# Patient Record
Sex: Male | Born: 1940 | Race: White | Hispanic: No | Marital: Married | Smoking: Never smoker
Health system: Southern US, Community
[De-identification: ages and names within clinical notes are randomized; demographics above are authoritative.]

## PROBLEM LIST (undated history)

## (undated) DIAGNOSIS — I350 Nonrheumatic aortic (valve) stenosis: Secondary | ICD-10-CM

## (undated) DIAGNOSIS — R0989 Other specified symptoms and signs involving the circulatory and respiratory systems: Secondary | ICD-10-CM

## (undated) DIAGNOSIS — Q231 Congenital insufficiency of aortic valve: Secondary | ICD-10-CM

## (undated) DIAGNOSIS — E785 Hyperlipidemia, unspecified: Secondary | ICD-10-CM

## (undated) DIAGNOSIS — R911 Solitary pulmonary nodule: Secondary | ICD-10-CM

## (undated) DIAGNOSIS — I219 Acute myocardial infarction, unspecified: Secondary | ICD-10-CM

## (undated) DIAGNOSIS — H409 Unspecified glaucoma: Secondary | ICD-10-CM

## (undated) DIAGNOSIS — I2510 Atherosclerotic heart disease of native coronary artery without angina pectoris: Secondary | ICD-10-CM

## (undated) DIAGNOSIS — I10 Essential (primary) hypertension: Secondary | ICD-10-CM

## (undated) DIAGNOSIS — Q2381 Bicuspid aortic valve: Secondary | ICD-10-CM

## (undated) DIAGNOSIS — R001 Bradycardia, unspecified: Secondary | ICD-10-CM

## (undated) HISTORY — DX: Hyperlipidemia, unspecified: E78.5

## (undated) HISTORY — DX: Congenital insufficiency of aortic valve: Q23.1

## (undated) HISTORY — DX: Bradycardia, unspecified: R00.1

## (undated) HISTORY — DX: Bicuspid aortic valve: Q23.81

## (undated) HISTORY — DX: Acute myocardial infarction, unspecified: I21.9

## (undated) HISTORY — PX: COLONOSCOPY: SHX174

## (undated) HISTORY — DX: Atherosclerotic heart disease of native coronary artery without angina pectoris: I25.10

## (undated) HISTORY — PX: OTHER SURGICAL HISTORY: SHX169

---

## 2004-09-30 ENCOUNTER — Ambulatory Visit (HOSPITAL_COMMUNITY): Admission: RE | Admit: 2004-09-30 | Discharge: 2004-09-30 | Payer: Self-pay | Admitting: Ophthalmology

## 2004-11-20 DIAGNOSIS — I219 Acute myocardial infarction, unspecified: Secondary | ICD-10-CM

## 2004-11-20 HISTORY — DX: Acute myocardial infarction, unspecified: I21.9

## 2005-07-27 ENCOUNTER — Inpatient Hospital Stay (HOSPITAL_COMMUNITY): Admission: EM | Admit: 2005-07-27 | Discharge: 2005-07-30 | Payer: Self-pay | Admitting: Emergency Medicine

## 2005-07-28 HISTORY — PX: CORONARY ANGIOPLASTY WITH STENT PLACEMENT: SHX49

## 2005-07-28 HISTORY — PX: CARDIAC CATHETERIZATION: SHX172

## 2005-08-24 ENCOUNTER — Encounter (HOSPITAL_COMMUNITY): Admission: RE | Admit: 2005-08-24 | Discharge: 2005-11-22 | Payer: Self-pay | Admitting: *Deleted

## 2005-11-23 ENCOUNTER — Encounter (HOSPITAL_COMMUNITY): Admission: RE | Admit: 2005-11-23 | Discharge: 2006-02-21 | Payer: Self-pay | Admitting: *Deleted

## 2011-05-10 ENCOUNTER — Telehealth: Payer: Self-pay | Admitting: Cardiovascular Disease

## 2011-05-10 NOTE — Telephone Encounter (Signed)
Called and left msg to call back with type of eye surg and date, also,dr not avail till Friday to give answer,

## 2011-05-10 NOTE — Telephone Encounter (Signed)
Called to say he is going to have eye surgery and wanted to know if he could go off his A1C asprin 7 days before hand. Please call back. I have pulled the chart.

## 2011-05-11 ENCOUNTER — Telehealth: Payer: Self-pay | Admitting: Cardiovascular Disease

## 2011-05-11 NOTE — Telephone Encounter (Signed)
I dont recall Anthony Carpenter's history.  Does he have a stent?  No records available in epic.

## 2011-05-11 NOTE — Telephone Encounter (Signed)
Having cataract sug 05/16/11, wants to know if he can stop asa 81mg  for 5 days prior to surg. Please advise. Alfonso Ramus RN

## 2011-05-11 NOTE — Telephone Encounter (Signed)
Pt said he called yesterday about getting off aspirin for his cataract surgery call his cell number

## 2011-05-12 ENCOUNTER — Telehealth: Payer: Self-pay | Admitting: *Deleted

## 2011-05-12 NOTE — Telephone Encounter (Signed)
Pt told Dr Elease Hashimoto prefers he stays on his 81mg  asa due to drug eluding stent.  Pt verbalized understanding. Alfonso Ramus RN

## 2011-06-28 ENCOUNTER — Encounter: Payer: Self-pay | Admitting: Cardiovascular Disease

## 2011-06-29 ENCOUNTER — Encounter: Payer: Self-pay | Admitting: *Deleted

## 2011-07-05 ENCOUNTER — Other Ambulatory Visit: Payer: Self-pay | Admitting: Cardiovascular Disease

## 2011-07-05 DIAGNOSIS — I2510 Atherosclerotic heart disease of native coronary artery without angina pectoris: Secondary | ICD-10-CM

## 2011-07-05 DIAGNOSIS — E785 Hyperlipidemia, unspecified: Secondary | ICD-10-CM

## 2011-07-07 ENCOUNTER — Ambulatory Visit (INDEPENDENT_AMBULATORY_CARE_PROVIDER_SITE_OTHER): Payer: Medicare Other | Admitting: Cardiovascular Disease

## 2011-07-07 ENCOUNTER — Other Ambulatory Visit (INDEPENDENT_AMBULATORY_CARE_PROVIDER_SITE_OTHER): Payer: Medicare Other | Admitting: *Deleted

## 2011-07-07 ENCOUNTER — Encounter: Payer: Self-pay | Admitting: Cardiovascular Disease

## 2011-07-07 VITALS — BP 118/68 | HR 51 | Ht 70.0 in | Wt 219.0 lb

## 2011-07-07 DIAGNOSIS — E785 Hyperlipidemia, unspecified: Secondary | ICD-10-CM

## 2011-07-07 DIAGNOSIS — I2510 Atherosclerotic heart disease of native coronary artery without angina pectoris: Secondary | ICD-10-CM

## 2011-07-07 LAB — LIPID PANEL
Cholesterol: 126 mg/dL (ref 0–200)
LDL Cholesterol: 76 mg/dL (ref 0–99)
Total CHOL/HDL Ratio: 3
VLDL: 10.4 mg/dL (ref 0.0–40.0)

## 2011-07-07 LAB — BASIC METABOLIC PANEL: Chloride: 106 mEq/L (ref 96–112)

## 2011-07-07 LAB — HEPATIC FUNCTION PANEL: Albumin: 4 g/dL (ref 3.5–5.2)

## 2011-07-07 MED ORDER — ATORVASTATIN CALCIUM 40 MG PO TABS: 20.0000 mg | ORAL_TABLET | Freq: Every day | ORAL | Status: DC

## 2011-07-07 MED ORDER — OMEGA-3-ACID ETHYL ESTERS 1 G PO CAPS: 2.0000 g | ORAL_CAPSULE | ORAL | Status: DC

## 2011-07-07 MED ORDER — NITROGLYCERIN 0.4 MG SL SUBL: 0.4000 mg | SUBLINGUAL_TABLET | SUBLINGUAL | Status: DC | PRN

## 2011-07-07 MED ORDER — LISINOPRIL 10 MG PO TABS: 10.0000 mg | ORAL_TABLET | Freq: Every day | ORAL | Status: DC

## 2011-07-07 NOTE — Assessment & Plan Note (Signed)
We will check his lipids today.  We'll continue with his same medications.

## 2011-07-07 NOTE — Assessment & Plan Note (Signed)
Anthony Carpenter is doing very well. He has not had any episodes of chest pain or shortness breath. We'll continue with the same medications. 

## 2011-07-07 NOTE — Progress Notes (Signed)
Anthony Carpenter Date of Birth  [birth year] [facility] / [facility] [address]     [address] [place], [place]  [address] [phone]  Fax  [phone]  History of Present Illness:  Anthony Carpenter is a 70 year old gentleman with a history of coronary artery disease. He status post PTCA and stenting. He's done very well. He's not had any episodes of chest pain or shortness breath. His medical Dr. stop his Niaspan.    Current Outpatient Prescriptions  Medication Sig Dispense Refill  . aspirin 81 MG tablet Take 81 mg by mouth daily.        Marland Kitchen atorvastatin (LIPITOR) 40 MG tablet Take 0.5 tablets (20 mg total) by mouth daily.  90 tablet  3  . lisinopril (PRINIVIL,ZESTRIL) 10 MG tablet Take 1 tablet (10 mg total) by mouth daily.  90 tablet  3  . nitroGLYCERIN (NITROSTAT) 0.4 MG SL tablet Place 1 tablet (0.4 mg total) under the tongue as needed.  25 tablet  3  . omega-3 acid ethyl esters (LOVAZA) 1 G capsule Take 2 capsules (2 g total) by mouth 1 day or 1 dose.  180 capsule  3     No Known Allergies  Past Medical History  Diagnosis Date  . Hyperlipidemia   . Coronary artery disease     Est EF of 55% -- status post PTCA and stenting of his mid LAD-placed a 2.75 x 20 mm taxus stent- It was post dilated using 3.0 Quantum Maverick--Possible bicuspid aortic valve  . Bradycardia   . Myocardial infarction     post stent to LAD in 2006  . Dyslipidemia   . Bicuspid aortic valve     Past Surgical History  Procedure Date  . Cardiac catheterization 07/28/2005    Est EF of 55% -- Single-vessel obstructive disease in the proximal/mid left anterior descending -- Preserved left ventricular systolic function with good anterior wall motion  . Coronary angioplasty with stent placement 07/28/2005    Successful PTCA and stenting of the mid and proximal left anterior descending artery.  The patient is stable leaving the catheterization laboratory -- Vesta Mixer, M.D.      History    Smoking status  . Never Smoker   Smokeless tobacco  . Never Used    History  Alcohol Use No    Family History  Problem Relation Age of Onset  . Cancer    . Heart disease Neg Hx     Reviw of Systems:  Reviewed in the HPI.  All other systems are negative.  Physical Exam: BP 118/68  Pulse 51  Ht 5\' 10"  (1.778 m)  Wt 219 lb (99.338 kg)  BMI 31.42 kg/m2 The patient is alert and oriented x 3.  The mood and affect are normal.   Skin: warm and dry.  Color is normal.    HEENT:   the sclera are nonicteric.  The mucous membranes are moist.  The carotids are 2+ without bruits.  There is no thyromegaly.  There is no JVD.    Lungs: clear.  The chest wall is non tender.    Heart: regular rate with a normal S1 and S2.  There are no murmurs, gallops, or rubs. The PMI is not displaced.     Abdomen: good bowel sounds.  There is no guarding or rebound.  There is no hepatosplenomegaly or tenderness.  There are no masses.   Extremities:  no clubbing, cyanosis, or edema.  The legs are without  rashes.  The distal pulses are intact.   Neuro:  Cranial nerves II - XII are intact.  Motor and sensory functions are intact.    The gait is normal.  ECG: Sinus brady,  No ST or T wave changes  Assessment / Plan:    b

## 2011-07-10 ENCOUNTER — Telehealth: Payer: Self-pay | Admitting: *Deleted

## 2011-07-10 NOTE — Telephone Encounter (Signed)
Message copied by Antony Odea on Mon Jul 10, 2011 11:20 AM ------      Message from: Vesta Mixer      Created: Sun Jul 09, 2011  6:01 AM       CHOL      126   07/07/2011      HDL    39.30   07/07/2011      LDLCALC       76   07/07/2011      TRIG     52.0   07/07/2011      CHOLHDL        3   07/07/2011      No results found for this basename: LDLDIRECT            Looks OK

## 2011-07-10 NOTE — Telephone Encounter (Signed)
LDL was available, normal results given

## 2011-07-12 ENCOUNTER — Encounter: Payer: Self-pay | Admitting: Cardiovascular Disease

## 2012-01-03 ENCOUNTER — Ambulatory Visit (INDEPENDENT_AMBULATORY_CARE_PROVIDER_SITE_OTHER): Payer: Medicare Other | Admitting: Cardiovascular Disease

## 2012-01-03 ENCOUNTER — Other Ambulatory Visit: Payer: Medicare Other | Admitting: *Deleted

## 2012-01-03 ENCOUNTER — Encounter: Payer: Self-pay | Admitting: Cardiovascular Disease

## 2012-01-03 DIAGNOSIS — E785 Hyperlipidemia, unspecified: Secondary | ICD-10-CM

## 2012-01-03 DIAGNOSIS — I2510 Atherosclerotic heart disease of native coronary artery without angina pectoris: Secondary | ICD-10-CM

## 2012-01-03 DIAGNOSIS — I10 Essential (primary) hypertension: Secondary | ICD-10-CM

## 2012-01-03 LAB — BASIC METABOLIC PANEL
CO2: 27 mEq/L (ref 19–32)
Calcium: 9.1 mg/dL (ref 8.4–10.5)
Chloride: 106 mEq/L (ref 96–112)
GFR: 79.05 mL/min (ref >60.00)
Sodium: 139 mEq/L (ref 135–145)

## 2012-01-03 LAB — LIPID PANEL
Cholesterol: 133 mg/dL (ref 0–200)
HDL: 36.6 mg/dL — ABNORMAL LOW (ref >39.00)
LDL Cholesterol: 85 mg/dL (ref 0–99)
Triglycerides: 59 mg/dL (ref 0.0–149.0)

## 2012-01-03 LAB — HEPATIC FUNCTION PANEL
ALT: 18 U/L (ref 0–53)
Albumin: 3.8 g/dL (ref 3.5–5.2)
Alkaline Phosphatase: 51 U/L (ref 39–117)
Total Bilirubin: 0.7 mg/dL (ref 0.3–1.2)
Total Protein: 6.8 g/dL (ref 6.0–8.3)

## 2012-01-03 NOTE — Assessment & Plan Note (Signed)
Olis is doing quite well. We'll continue with his same medications. We'll recheck fasting lipids on him in 6 months.

## 2012-01-03 NOTE — Patient Instructions (Signed)
Your physician recommends that you return for a FASTING lipid profile: today   Your physician wants you to follow-up in: 6 months  You will receive a reminder letter in the mail two months in advance. If you don't receive a letter, please call our office to schedule the follow-up appointment.   

## 2012-01-03 NOTE — Progress Notes (Signed)
Anthony Carpenter Date of Birth  May 31, 1941 [facility]     [place] Office  [address]    [address]   [address] [place], [place]  [address]    [place], [place]  [address] [phone]  Fax  [phone]  [phone]  Fax [phone]  Problem list:  1. Coronary artery disease-status post PTCA and stenting of his mid LAD. We placed a 2.75 x 20 mm Taxus stent. It was post dilated using a 3.0 Quantum Maverick, 07/28/05 2. Hyperlipidemia 3. Possible bicuspid aortic valve   History of Present Illness:  Pt is doing well.  No chest pain or dyspnea.  He plans on putting in a garden this summer .  He's building his house this year.  He has had lots of   Current Outpatient Prescriptions on File Prior to Visit  Medication Sig Dispense Refill  . aspirin 81 MG tablet Take 81 mg by mouth daily.        Marland Kitchen atorvastatin (LIPITOR) 40 MG tablet Take 0.5 tablets (20 mg total) by mouth daily.  90 tablet  3  . lisinopril (PRINIVIL,ZESTRIL) 10 MG tablet Take 1 tablet (10 mg total) by mouth daily.  90 tablet  3  . nitroGLYCERIN (NITROSTAT) 0.4 MG SL tablet Place 1 tablet (0.4 mg total) under the tongue as needed.  25 tablet  3  . omega-3 acid ethyl esters (LOVAZA) 1 G capsule Take 2 capsules (2 g total) by mouth 1 day or 1 dose.  180 capsule  3    No Known Allergies  Past Medical History  Diagnosis Date  . Hyperlipidemia   . Coronary artery disease     Est EF of 55% -- status post PTCA and stenting of his mid LAD-placed a 2.75 x 20 mm taxus stent- It was post dilated using 3.0 Quantum Maverick--Possible bicuspid aortic valve  . Bradycardia   . Myocardial infarction     post stent to LAD in 2006  . Dyslipidemia   . Bicuspid aortic valve     Past Surgical History  Procedure Date  . Cardiac catheterization 07/28/2005    Est EF of 55% -- Single-vessel obstructive disease in the proximal/mid left anterior descending -- Preserved left ventricular systolic function with good anterior  wall motion  . Coronary angioplasty with stent placement 07/28/2005    Successful PTCA and stenting of the mid and proximal left anterior descending artery.  The patient is stable leaving the catheterization laboratory -- Vesta Mixer, M.D.      History  Smoking status  . Never Smoker   Smokeless tobacco  . Never Used    History  Alcohol Use No    Family History  Problem Relation Age of Onset  . Cancer    . Heart disease Neg Hx     Reviw of Systems:  Reviewed in the HPI.  All other systems are negative.  Physical Exam: Blood pressure 121/72, pulse 55, height 5\' 10"  (1.778 m), weight 223 lb 12.8 oz (101.515 kg). General: Well developed, well nourished, in no acute distress.  Head: Normocephalic, atraumatic, sclera non-icteric, mucus membranes are moist,   Neck: Supple. Negative for carotid bruits. JVD not elevated.  Lungs: Clear bilaterally to auscultation without wheezes, rales, or rhonchi. Breathing is unlabored.  Heart: RRR with S1 S2. No murmurs, rubs, or gallops appreciated.  Abdomen: Soft, non-tender, non-distended with normoactive bowel sounds. No hepatomegaly. No rebound/guarding. No obvious abdominal masses.  Msk:  Strength and tone  appear normal for age.  Extremities: No clubbing or cyanosis. No edema.  Distal pedal pulses are 2+ and equal bilaterally.  Neuro: Alert and oriented X 3. Moves all extremities spontaneously.  Psych:  Responds to questions appropriately with a normal affect.  ECG:  Assessment / Plan:

## 2012-03-10 ENCOUNTER — Emergency Department (HOSPITAL_COMMUNITY): Payer: Medicare Other

## 2012-03-10 ENCOUNTER — Encounter (HOSPITAL_COMMUNITY): Payer: Self-pay | Admitting: Emergency Medicine

## 2012-03-10 ENCOUNTER — Observation Stay (HOSPITAL_COMMUNITY)
Admission: EM | Admit: 2012-03-10 | Discharge: 2012-03-11 | Disposition: A | Discharge status: Home / Self Care | Payer: Medicare Other | Attending: Internal Medicine | Admitting: Internal Medicine

## 2012-03-10 DIAGNOSIS — R110 Nausea: Secondary | ICD-10-CM | POA: Insufficient documentation

## 2012-03-10 DIAGNOSIS — Z9861 Coronary angioplasty status: Secondary | ICD-10-CM | POA: Insufficient documentation

## 2012-03-10 DIAGNOSIS — R079 Chest pain, unspecified: Secondary | ICD-10-CM

## 2012-03-10 DIAGNOSIS — I2510 Atherosclerotic heart disease of native coronary artery without angina pectoris: Secondary | ICD-10-CM | POA: Insufficient documentation

## 2012-03-10 DIAGNOSIS — E785 Hyperlipidemia, unspecified: Secondary | ICD-10-CM | POA: Insufficient documentation

## 2012-03-10 DIAGNOSIS — Q231 Congenital insufficiency of aortic valve: Secondary | ICD-10-CM | POA: Insufficient documentation

## 2012-03-10 DIAGNOSIS — I252 Old myocardial infarction: Secondary | ICD-10-CM | POA: Insufficient documentation

## 2012-03-10 DIAGNOSIS — R1013 Epigastric pain: Principal | ICD-10-CM | POA: Insufficient documentation

## 2012-03-10 LAB — CBC
HCT: 41 % (ref 39.0–52.0)
Hemoglobin: 13.9 g/dL (ref 13.0–17.0)
MCHC: 33.9 g/dL (ref 30.0–36.0)
MCV: 85.1 fL (ref 78.0–100.0)
Platelets: 193 10*3/uL (ref 150–400)
RDW: 13.5 % (ref 11.5–15.5)

## 2012-03-10 LAB — BASIC METABOLIC PANEL
Calcium: 9.2 mg/dL (ref 8.4–10.5)
Chloride: 101 mEq/L (ref 96–112)
Creatinine, Ser: 1.1 mg/dL (ref 0.50–1.35)
Glucose, Bld: 135 mg/dL — ABNORMAL HIGH (ref 70–99)
Potassium: 4.2 mEq/L (ref 3.5–5.1)

## 2012-03-10 LAB — POCT I-STAT TROPONIN I: Troponin i, poc: 0.01 ng/mL (ref 0.00–0.08)

## 2012-03-10 MED ORDER — MORPHINE SULFATE 4 MG/ML IJ SOLN
4.0000 mg | Freq: Once | INTRAMUSCULAR | Status: AC
  Administered 2012-03-11: 4 mg via INTRAVENOUS
  Filled 2012-03-10: qty 1

## 2012-03-10 MED ORDER — ASPIRIN 81 MG PO CHEW
324.0000 mg | CHEWABLE_TABLET | Freq: Once | ORAL | Status: AC
  Administered 2012-03-10: 324 mg via ORAL
  Filled 2012-03-10: qty 4

## 2012-03-10 MED ORDER — NITROGLYCERIN 0.4 MG SL SUBL
0.4000 mg | SUBLINGUAL_TABLET | SUBLINGUAL | Status: DC | PRN
  Administered 2012-03-10: 0.4 mg via SUBLINGUAL
  Filled 2012-03-10: qty 25

## 2012-03-10 NOTE — ED Provider Notes (Signed)
History     CSN: [id number]  Arrival date & time 03/10/12  2111   First MD Initiated Contact with Patient 03/10/12 2219      Chief Complaint  Patient presents with  . Abdominal Pain    (Consider location/radiation/quality/duration/timing/severity/associated sxs/prior treatment) HPI Comments: Patient presents with chest pain across his lower chest wall and radiating around to his upper back area. This had been going on for the last couple hours. It started just after he ate dinner. He denies any shortness of breath. He did have some nausea. No diaphoresis. No vomiting. He has history of coronary artery disease with an MI and stent placed in 2006. He states that the pain is having today feels similar to the pain. He had when he had his past myocardial infarction. The pain is a tight feeling. There is no sharp pain is not worse with breathing not worse with movement. He denies any cough or congestion. Denies recent fevers. Denies the abdominal pain. Denies any leg pain or swelling. He takes a nitroglycerin prior to arrival and he feels like his pain has improved with the nitroglycerin  The history is provided by the patient.    Past Medical History  Diagnosis Date  . Hyperlipidemia   . Coronary artery disease     Est EF of 55% -- status post PTCA and stenting of his mid LAD-placed a 2.75 x 20 mm taxus stent- It was post dilated using 3.0 Quantum Maverick--Possible bicuspid aortic valve  . Bradycardia   . Myocardial infarction     post stent to LAD in 2006  . Dyslipidemia   . Bicuspid aortic valve     Past Surgical History  Procedure Date  . Cardiac catheterization 07/28/2005    Est EF of 55% -- Single-vessel obstructive disease in the proximal/mid left anterior descending -- Preserved left ventricular systolic function with good anterior wall motion  . Coronary angioplasty with stent placement 07/28/2005    Successful PTCA and stenting of the mid and proximal left anterior  descending artery.  The patient is stable leaving the catheterization laboratory -- Vesta Mixer, M.D.      Family History  Problem Relation Age of Onset  . Cancer    . Heart disease Neg Hx     History  Substance Use Topics  . Smoking status: Never Smoker   . Smokeless tobacco: Never Used  . Alcohol Use: No      Review of Systems  Constitutional: Negative for fever, chills, diaphoresis and fatigue.  HENT: Negative for congestion, rhinorrhea and sneezing.   Eyes: Negative.   Respiratory: Positive for chest tightness. Negative for cough and shortness of breath.   Cardiovascular: Negative for chest pain and leg swelling.  Gastrointestinal: Positive for nausea. Negative for vomiting, abdominal pain, diarrhea and blood in stool.  Genitourinary: Negative for frequency, hematuria, flank pain and difficulty urinating.  Musculoskeletal: Positive for back pain. Negative for arthralgias.  Skin: Negative for rash.  Neurological: Negative for dizziness, speech difficulty, weakness, numbness and headaches.    Allergies  Review of patient's allergies indicates no known allergies.  Home Medications   Current Outpatient Rx  Name Route Sig Dispense Refill  . ASPIRIN EC 81 MG PO TBEC Oral Take 81 mg by mouth daily.    . ATORVASTATIN CALCIUM 40 MG PO TABS Oral Take 20 mg by mouth daily.    . DORZOLAMIDE HCL-TIMOLOL MAL 22.3-6.8 MG/ML OP SOLN Ophthalmic Apply 1 drop to eye 2 (two) times daily.     Marland Kitchen  LATANOPROST 0.005 % OP SOLN Both Eyes Place 1 drop into both eyes at bedtime.     Marland Kitchen LISINOPRIL 10 MG PO TABS Oral Take 10 mg by mouth daily.    . ADULT MULTIVITAMIN W/MINERALS CH Oral Take 1 tablet by mouth daily.    Marland Kitchen NITROGLYCERIN 0.4 MG SL SUBL Sublingual Place 0.4 mg under the tongue every 5 (five) minutes as needed. For chest pain.    Marland Kitchen OMEGA-3-ACID ETHYL ESTERS 1 G PO CAPS Oral Take 2 g by mouth daily.      BP 120/53  Pulse 58  Temp(Src) 97.8 F (36.6 C) (Oral)  Resp 16  SpO2  100%  Physical Exam  Constitutional: He is oriented to person, place, and time. He appears well-developed and well-nourished.  HENT:  Head: Normocephalic and atraumatic.  Eyes: Pupils are equal, round, and reactive to light.  Neck: Normal range of motion. Neck supple.  Cardiovascular: Normal rate, regular rhythm, normal heart sounds and intact distal pulses.        Femoral and pedal pulses are symmetric  Pulmonary/Chest: Effort normal and breath sounds normal. No respiratory distress. He has no wheezes. He has no rales. He exhibits no tenderness.  Abdominal: Soft. Bowel sounds are normal. There is no tenderness. There is no rebound and no guarding.  Musculoskeletal: Normal range of motion. He exhibits no edema.  Lymphadenopathy:    He has no cervical adenopathy.  Neurological: He is alert and oriented to person, place, and time.  Skin: Skin is warm and dry. No rash noted.  Psychiatric: He has a normal mood and affect.    ED Course  Procedures (including critical care time)  Results for orders placed during the hospital encounter of 03/10/12  CBC      Component Value Range   WBC 10.7 (*) 4.0 - 10.5 (K/uL)   RBC 4.82  4.22 - 5.81 (MIL/uL)   Hemoglobin 13.9  13.0 - 17.0 (g/dL)   HCT 16.1  09.6 - 04.5 (%)   MCV 85.1  78.0 - 100.0 (fL)   MCH 28.8  26.0 - 34.0 (pg)   MCHC 33.9  30.0 - 36.0 (g/dL)   RDW 40.9  81.1 - 91.4 (%)   Platelets 193  150 - 400 (K/uL)  BASIC METABOLIC PANEL      Component Value Range   Sodium 135  135 - 145 (mEq/L)   Potassium 4.2  3.5 - 5.1 (mEq/L)   Chloride 101  96 - 112 (mEq/L)   CO2 26  19 - 32 (mEq/L)   Glucose, Bld 135 (*) 70 - 99 (mg/dL)   BUN 17  6 - 23 (mg/dL)   Creatinine, Ser 7.82  0.50 - 1.35 (mg/dL)   Calcium 9.2  8.4 - 95.6 (mg/dL)   GFR calc non Af Amer 66 (*) >90 (mL/min)   GFR calc Af Amer 76 (*) >90 (mL/min)  POCT I-STAT TROPONIN I      Component Value Range   Troponin i, poc 0.01  0.00 - 0.08 (ng/mL)   Comment 3            Dg  Chest 2 View  03/10/2012  *RADIOLOGY REPORT*  Clinical Data: Chest pain and nausea and  CHEST - 2 VIEW  Comparison: 07/1975  Findings: Slightly shallow inspiration.  Normal heart size and pulmonary vascularity.  No focal airspace consolidation in the lungs.  No blunting of costophrenic angles.  No pneumothorax.  No significant change since prior study.  IMPRESSION: No evidence  of active pulmonary disease.  Original Report Authenticated By: Marlon Pel, M.D.    Date: 03/11/2012  Rate: 50  Rhythm: sinus bradycardia  QRS Axis: normal  Intervals: normal  ST/T Wave abnormalities: nonspecific ST/T changes  Conduction Disutrbances:none  Narrative Interpretation:   Old EKG Reviewed: unchanged     1. Chest pain       MDM  Patient given nitroglycerin in the emergency department, as well as aspirin and he is essentially pain-free at this point. I feel that the symptoms that he needs more extensive cardiac evaluation. I spoke with Dr. Margo Aye with [facility] cardiology, who is going to come in and see the patient and possibly admit the patient        Rolan Bucco, MD 03/11/12 0110

## 2012-03-10 NOTE — ED Notes (Signed)
nad ntoed abc intact.

## 2012-03-10 NOTE — ED Notes (Addendum)
(  Pt reports "chest pain" but points to upper abd when asked to show where pain is at.)  Pain across upper abd that radiates to back, nausea, and vomiting x 1 1/2 hours. Denies sob.  Pt states pain is near same area as before when he had MI- states he never had pain in upper chest at that time.  Took 1 NTG at home without relief.

## 2012-03-10 NOTE — ED Notes (Signed)
Pt complains of chest pain, onset tonight at 1930 after eating salad and baked potato, pt sts pain radiates to his back and when asked to point to where pain is specifically he points across lower ribs all the way across into back. Alert and oriented, nad noted, pt stable. Awaiting md to evaluate.

## 2012-03-11 ENCOUNTER — Observation Stay (HOSPITAL_COMMUNITY): Payer: Medicare Other

## 2012-03-11 ENCOUNTER — Encounter (HOSPITAL_COMMUNITY): Payer: Self-pay | Admitting: General Practice

## 2012-03-11 DIAGNOSIS — I359 Nonrheumatic aortic valve disorder, unspecified: Secondary | ICD-10-CM

## 2012-03-11 DIAGNOSIS — R079 Chest pain, unspecified: Secondary | ICD-10-CM

## 2012-03-11 LAB — CARDIAC PANEL(CRET KIN+CKTOT+MB+TROPI)
Relative Index: INVALID (ref 0.0–2.5)
Relative Index: INVALID (ref 0.0–2.5)
Relative Index: INVALID (ref 0.0–2.5)
Total CK: 47 U/L (ref 7–232)
Total CK: 45 U/L (ref 7–232)
Troponin I: <0.3 ng/mL (ref <0.30)
Troponin I: <0.3 ng/mL (ref <0.30)

## 2012-03-11 LAB — CBC
HCT: 39.5 % (ref 39.0–52.0)
Hemoglobin: 13.4 g/dL (ref 13.0–17.0)
RBC: 4.72 MIL/uL (ref 4.22–5.81)
WBC: 11.4 10*3/uL — ABNORMAL HIGH (ref 4.0–10.5)

## 2012-03-11 LAB — URINALYSIS, ROUTINE W REFLEX MICROSCOPIC
Hgb urine dipstick: NEGATIVE
Leukocytes, UA: NEGATIVE
Specific Gravity, Urine: 1.021 (ref 1.005–1.030)
Urobilinogen, UA: 1 mg/dL (ref 0.0–1.0)

## 2012-03-11 LAB — HEPATIC FUNCTION PANEL
AST: 19 U/L (ref 0–37)
Albumin: 3.5 g/dL (ref 3.5–5.2)
Bilirubin, Direct: 0.1 mg/dL (ref 0.0–0.3)

## 2012-03-11 LAB — CREATININE, SERUM
Creatinine, Ser: 0.88 mg/dL (ref 0.50–1.35)
GFR calc Af Amer: >90 mL/min (ref >90)
GFR calc non Af Amer: 84 mL/min — ABNORMAL LOW (ref >90)

## 2012-03-11 MED ORDER — ACETAMINOPHEN 325 MG PO TABS
650.0000 mg | ORAL_TABLET | ORAL | Status: DC | PRN
  Administered 2012-03-11: 650 mg via ORAL
  Filled 2012-03-11: qty 2

## 2012-03-11 MED ORDER — NITROGLYCERIN 0.4 MG SL SUBL: 0.4000 mg | SUBLINGUAL_TABLET | SUBLINGUAL | Status: DC | PRN

## 2012-03-11 MED ORDER — ONDANSETRON HCL 4 MG/2ML IJ SOLN: 4.0000 mg | Freq: Four times a day (QID) | INTRAMUSCULAR | Status: DC | PRN

## 2012-03-11 MED ORDER — REGADENOSON 0.4 MG/5ML IV SOLN
0.4000 mg | Freq: Once | INTRAVENOUS | Status: DC
  Filled 2012-03-11: qty 5

## 2012-03-11 MED ORDER — ATORVASTATIN CALCIUM 20 MG PO TABS
20.0000 mg | ORAL_TABLET | Freq: Every day | ORAL | Status: DC
  Administered 2012-03-11: 20 mg via ORAL
  Filled 2012-03-11: qty 1

## 2012-03-11 MED ORDER — ASPIRIN EC 81 MG PO TBEC: 81.0000 mg | DELAYED_RELEASE_TABLET | Freq: Every day | ORAL | Status: DC

## 2012-03-11 MED ORDER — ASPIRIN 300 MG RE SUPP: 300.0000 mg | RECTAL | Status: DC

## 2012-03-11 MED ORDER — LISINOPRIL 10 MG PO TABS
10.0000 mg | ORAL_TABLET | Freq: Every day | ORAL | Status: DC
  Administered 2012-03-11: 10 mg via ORAL
  Filled 2012-03-11: qty 1

## 2012-03-11 MED ORDER — OMEGA-3-ACID ETHYL ESTERS 1 G PO CAPS
2.0000 g | ORAL_CAPSULE | Freq: Every day | ORAL | Status: DC
  Administered 2012-03-11: 2 g via ORAL
  Filled 2012-03-11: qty 2

## 2012-03-11 MED ORDER — TECHNETIUM TC 99M TETROFOSMIN IV KIT
10.0000 | PACK | Freq: Once | INTRAVENOUS | Status: AC | PRN
  Administered 2012-03-11: 10 via INTRAVENOUS

## 2012-03-11 MED ORDER — HEPARIN SODIUM (PORCINE) 5000 UNIT/ML IJ SOLN
5000.0000 [IU] | Freq: Three times a day (TID) | INTRAMUSCULAR | Status: DC
  Administered 2012-03-11: 5000 [IU] via SUBCUTANEOUS
  Filled 2012-03-11 (×3): qty 1

## 2012-03-11 MED ORDER — ASPIRIN 81 MG PO CHEW: 324.0000 mg | CHEWABLE_TABLET | ORAL | Status: DC

## 2012-03-11 MED ORDER — TECHNETIUM TC 99M TETROFOSMIN IV KIT
30.0000 | PACK | Freq: Once | INTRAVENOUS | Status: AC | PRN
  Administered 2012-03-11: 30 via INTRAVENOUS

## 2012-03-11 MED ORDER — ADULT MULTIVITAMIN W/MINERALS CH
1.0000 | ORAL_TABLET | Freq: Every day | ORAL | Status: DC
  Administered 2012-03-11: 1 via ORAL
  Filled 2012-03-11: qty 1

## 2012-03-11 MED ORDER — REGADENOSON 0.4 MG/5ML IV SOLN
0.4000 mg | Freq: Once | INTRAVENOUS | Status: AC
  Administered 2012-03-11: 0.4 mg via INTRAVENOUS

## 2012-03-11 MED ORDER — LATANOPROST 0.005 % OP SOLN
1.0000 [drp] | Freq: Every day | OPHTHALMIC | Status: DC
  Filled 2012-03-11: qty 2.5

## 2012-03-11 NOTE — H&P (Signed)
NAME:  [name], [name]:  [id number]  MEDICAL RECORD NO.:  [id number]  LOCATION:  [facility]                        FACILITY:  [facility]  PHYSICIAN:  Natasha Bence, MD       DATE OF BIRTH:  June 21, 1941  DATE OF ADMISSION:  03/10/2012 DATE OF DISCHARGE:                             HISTORY & PHYSICAL   PRIMARY CARDIOLOGIST:  Vesta Mixer, M.D.  CHIEF COMPLAINT:  Chest discomfort.  HISTORY OF PRESENT ILLNESS:  Mr. Anthony Carpenter is a 71 year old white male with a history of coronary artery disease, status post stenting to his mid LAD who presents with chest discomfort that began around 7 p.m. after dinner tonight.  He reports his pain lasted approximately 4-1/2 hours without relief, was not associated with any shortness of breath, but he did have some nausea associated with this.  He denies any diaphoresis or lightheadedness.  This is very similar to his previous chest discomfort prior to his stent.  Otherwise, he has not had any recent anginal episodes.  He denies any PND or orthopnea.  No palpitations, lightheadedness, or dizziness.  The pain is currently relieved after getting nitroglycerin and morphine in the ED.  Otherwise, he denies any fevers, chills, sweats.  No recent weight loss.  He has had no headache, blurry vision.  No numbness, tingling, or weakness.  No bleeding or melena.  Complete review of systems was performed and was negative except for what was stated above.  PAST MEDICAL HISTORY: 1. Coronary artery disease, status post PCI to his LAD. 2. Dyslipidemia. 3. Bicuspid aortic valve. 4. Bradycardia.  FAMILY HISTORY:  No known history of coronary artery disease.  SOCIAL HISTORY:  Smoking history:  He is a lifelong nonsmoker.  No alcohol use.  ALLERGIES:  He has no known drug allergies.  HOME MEDICATIONS: 1. Aspirin 81 mg p.o. daily. 2. Lipitor 20 mg p.o. daily. 3. Cosopt eye drops daily. 4. Lisinopril 10 mg p.o. daily. 5. Multivitamin daily. 6.  Nitroglycerin p.r.n. 7. Omega-3 fatty acids 2 g p.o. daily.  PHYSICAL EXAMINATION:  VITAL SIGNS:  Afebrile, temperature 97.8, pulse 58 and regular, respiratory rate of 15, blood pressure 130/54, O2 sats 97% on room air. GENERAL:  He is a well-developed, well-nourished male, in no apparent distress. HEENT:  Eyes, he has anicteric sclerae.  Mucous membranes are moist. NECK:  Normal jugular venous pressure.  No carotid bruits. LUNGS:  Clear to auscultation bilaterally. CARDIOVASCULAR:  Regular rate and rhythm.  No murmurs, rubs, or gallops. ABDOMEN:  Soft, nontender, and nondistended.  No masses. EXTREMITIES:  Warm with no edema.  He has symmetrical pulses throughout. SKIN:  No rashes or ulcers. NEUROLOGIC:  Grossly afocal.  LABORATORY DATA:  Sodium 135, potassium 4.2, chloride of 101, bicarb of 26, BUN of 17, creatinine of 1.1, calcium of 9.2, glucose 135.  Troponin was 0.01.  White blood cell count was 10.7, hemoglobin was 13.9, and platelet count was 193.  Chest x-ray showed no acute infiltrates or effusion.  There is no widening of his mediastinum.  An EKG shows he has sinus bradycardia with a rate of 50 beats per minute.  No acute ischemic changes.  IMPRESSION AND  PLAN:  This is a 71 year old white male with history of known coronary artery disease, status post percutaneous coronary intervention to his left anterior descending who presents with chest discomfort concerning for angina, similar to his previous episodes.  His EKG shows no acute ischemic changes.  His initial point-of-care troponin was also negative.  He is currently pain-free and hemodynamically stable.  We will admit him to telemetry for observation and serial cardiac enzymes.  He is already on aspirin.  We will continue his statin as well.  I am not able to put him on beta-blocker due to his bradycardia.  Although he has a bicuspid valve, very low likelihood aortic dissection given his history, he has normal chest  x-ray and symmetrical pulses.  However, someone to consider evaluation with an echocardiogram to evaluate his aortic valve in addition to his aorta.          ______________________________ Natasha Bence, MD     MH/MEDQ  D:  03/11/2012  T:  03/11/2012  Job:  [id number]

## 2012-03-11 NOTE — Progress Notes (Signed)
  Echocardiogram 2D Echocardiogram has been performed.  Anthony Carpenter 03/11/2012, 3:46 PM

## 2012-03-11 NOTE — Discharge Summary (Signed)
Discharge Summary   Patient ID: Anthony Carpenter MRN: [id number], DOB/AGE: 1941/02/25 71 y.o. Admit date: 03/10/2012 D/C date:     03/11/2012   Primary Discharge Diagnoses:  1. Epigastric pain/nausea/vomiting, transient - ruled out for MI - nuclear stress test today demonstrating small fixed distal anteroseptal defect compatible with attenuation or scar, no evidence for inducible perfusion abnormality, EF 70% 2. H/o CAD s/p PTCA/Taxus stent to mid-prox LAD 2006 - not on BB because of baseline bradycardia 3. Bicuspid AV 4. Leukocytosis 5. Hyperglycemia without hx of DM 6. Hyperlipidemia  Hospital Course: 71 y/o M with hx of CAD s/p LAD stenting in 2006 who has done well since that time presented with an episode of epigastric discomfort associated with nausea and vomiting last night. The pain lasted about 4 1/2 hours without relief, was not associated with any SOB, diaphoresis or lightheadness. This was similar to his prior anginal pain except he did not recall having any vomiting with that. He denied any palpitations, blurry vision, numbness, tingling, or weakness. No bleeding or melena. Initial troponin was negative, EKG demonstrated sinus bradycardia with a rate of 50 beats per minute, no acute ischemic changes. CXR was without acute infiltrates or effusion and there was no widening of his mediastinum. He is known to have a bicuspid AV but did not have significant murmur on exam and had symmetrical pulses throughout. F/u cardiac enzymes were negative. His WBC was mildly elevated but UA and LFTs was normal. He underwent nuclear stress testing this afternoon demonstrating normal EF. 2D echo is pending at this time but if it is normal, he will be allowed to be discharged. It is possible that this pain was a very self-limiting gastroenteritis. The patient was seen and examined today and felt stable for discharge by Dr. Elease Hashimoto. He did have a low-grade fever of 100.8 but this afternoon was without  complaint. Dr. Elease Hashimoto discussed this with the patient's PCP who scheduled a f/u appointment for the patient tomorrow at 4:15pm.  Discharge Vitals: Blood pressure 124/61, pulse 76, temperature 100.8 F (38.2 C), temperature source Oral, resp. rate 18, SpO2 97.00%.  Labs: Lab Results  Component Value Date   WBC 11.4* 03/11/2012   HGB 13.4 03/11/2012   HCT 39.5 03/11/2012   MCV 83.7 03/11/2012   PLT 172 03/11/2012     Lab 03/11/12 0832 03/11/12 0530 03/10/12 2142  NA -- -- 135  K -- -- 4.2  CL -- -- 101  CO2 -- -- 26  BUN -- -- 17  CREATININE -- 0.88 --  CALCIUM -- -- 9.2  PROT 6.3 -- --  BILITOT 0.5 -- --  ALKPHOS 61 -- --  ALT 14 -- --  AST 19 -- --  GLUCOSE -- -- 135*    Basename 03/11/12 0832 03/11/12 0530  CKTOTAL 45 47  CKMB 2.0 2.0  TROPONINI <0.30 <0.30   Lab Results  Component Value Date   CHOL 133 01/03/2012   HDL 36.60* 01/03/2012   LDLCALC 85 01/03/2012   TRIG 59.0 01/03/2012     Diagnostic Studies/Procedures   1. Chest 2 View 03/10/2012  *RADIOLOGY REPORT*  Clinical Data: Chest pain and nausea and  CHEST - 2 VIEW  Comparison: 07/1975  Findings: Slightly shallow inspiration.  Normal heart size and pulmonary vascularity.  No focal airspace consolidation in the lungs.  No blunting of costophrenic angles.  No pneumothorax.  No significant change since prior study.  IMPRESSION: No evidence of active pulmonary disease.  Original Report  Authenticated By: Marlon Pel, M.D.   2. Nm Myocar Multi W/spect W/wall Motion / Ef 03/11/2012  *RADIOLOGY REPORT*  Clinical Data:  Chest pain  MYOCARDIAL IMAGING WITH SPECT (REST AND PHARMACOLOGIC-STRESS) GATED LEFT VENTRICULAR WALL MOTION STUDY LEFT VENTRICULAR EJECTION FRACTION  Technique:  Standard myocardial SPECT imaging was performed after resting intravenous injection of 10 mCi Tc-53m tetrofosmin. Subsequently, intravenous infusion of Lexiscan was performed under the supervision of the Cardiology staff.  At peak effect of the  drug, 30 mCi Tc-7m tetrofosmin was injected intravenously and standard myocardial SPECT  imaging was performed.  Quantitative gated imaging was also performed to evaluate left ventricular wall motion, and estimate left ventricular ejection fraction.  Comparison:  None.  Findings: Multiplanar SPECT imaging shows a small fixed defect in the distal anteroseptal wall.  No evidence for a reversible perfusion defect within the left ventricle myocardium.  Left ventricular cavity size appears grossly normal.  Images obtained with cardiac gating displayed using a surface rendering algorithm show no segmental wall motion abnormality.  Left ventricular end diastolic volume is calculated at 90 ml.  Left ventricular end systolic volume is calculated at 27 ml.  The derived left ventricular ejection fraction is 70%.  IMPRESSION: Small fixed distal anteroseptal defect compatible with attenuation or scar.  No evidence for inducible perfusion abnormality.  Normal left ventricular ejection fraction.  Original Report Authenticated By: ERIC A. MANSELL, M.D.   3. 2D echo - (will be reviewed by my colleague prior to discharge) - see report in EPIC  Discharge Medications   Medication List  As of 03/11/2012  4:24 PM   TAKE these medications         aspirin EC 81 MG tablet   Take 81 mg by mouth daily.      atorvastatin 40 MG tablet   Commonly known as: LIPITOR   Take 20 mg by mouth daily.      dorzolamide-timolol 22.3-6.8 MG/ML ophthalmic solution   Commonly known as: COSOPT   Apply 1 drop to eye 2 (two) times daily.      latanoprost 0.005 % ophthalmic solution   Commonly known as: XALATAN   Place 1 drop into both eyes at bedtime.      lisinopril 10 MG tablet   Commonly known as: PRINIVIL,ZESTRIL   Take 10 mg by mouth daily.      mulitivitamin with minerals Tabs   Take 1 tablet by mouth daily.      nitroGLYCERIN 0.4 MG SL tablet   Commonly known as: NITROSTAT   Place 0.4 mg under the tongue every 5 (five)  minutes as needed. For chest pain.      omega-3 acid ethyl esters 1 G capsule   Commonly known as: LOVAZA   Take 2 g by mouth daily.            Disposition   The patient will be discharged in stable condition to home. Discharge Orders    Future Appointments: Provider: Department: Dept Phone: Center:   04/05/2012 10:45 AM Vesta Mixer, MD [facility] [phone] None     Future Orders Please Complete By Expires   Diet - low sodium heart healthy      Increase activity slowly      Comments:   Follow up with your primary care doctor tomorrow. Your white blood cell count was mildly elevated during your hospitalization and will need to be rechecked to make sure it comes back to normal. Your blood sugar was also mildly  high when you first came into the hospital - this can be elevated due to acute illness, but may require monitoring by your medical doctor to check for diabetes.     Follow-up Information    Follow up with Dr. Foy Guadalajara. (03/12/12 at 4:15pm)       Follow up with Elyn Aquas., MD. (04/05/12 at 10:45am)    Contact information:   [address], [address] [place] [address] [phone]            Duration of Discharge Encounter: Greater than 30 minutes including physician and PA time.  Signed, Ronie Spies PA-C 03/11/2012, 4:24 PM  Attending Note:   The patient was seen and examined.  Agree with assessment and plan as noted above.  See my note from earlier today.  Vesta Mixer, Montez Hageman., MD, [facility] 03/11/2012, 5:40 PM

## 2012-03-11 NOTE — Progress Notes (Signed)
UR Completed. Carpenter, Anthony Defreitas F [phone]  

## 2012-03-11 NOTE — ED Notes (Signed)
Wife given recliner for comfort, awaiting bed placement.

## 2012-03-11 NOTE — ED Notes (Signed)
Dr. Hall at bedside.

## 2012-03-11 NOTE — Progress Notes (Signed)
Patient: Anthony Carpenter Date of Encounter: 03/11/2012, 7:11 AM Admit date: 03/10/2012     Subjective  Developed epigastric pain to his back with 4 episodes of vomiting last night, initially unrelieved by SL NTG at home but partially relieved by NTG/ASA in ER, fully relieved with administration of morphine. Has been pain free since. Symptoms remind him of anginal sx before cath 2006 (except he did not have vomiting at that time). No SOB, dysuria, hematuria, hematemesis, syncope, dizziness, diarrhea. WBC mildly elevated. Lab did not collect 3am cardiac enzymes - have contacted them and they will be adding on to 5am labs.    Objective   Telemetry: NSR/SB, 1st degree AVB Physical Exam: Filed Vitals:   03/11/12 0306  BP: 120/68  Pulse: 60  Temp: 98.5 F (36.9 C)  Resp: 16   General: Well developed, well nourished, in no acute distress. Head: Normocephalic, atraumatic, sclera non-icteric, no xanthomas, nares are without discharge.  Neck: Negative for carotid bruits. JVD not elevated. Lungs: Clear bilaterally to auscultation without wheezes, rales, or rhonchi. Breathing is unlabored. Heart: RRR S1 S2 without murmurs, rubs, or gallops.  Abdomen: Soft, non-tender, non-distended with normoactive bowel sounds. No hepatomegaly. No rebound/guarding. No obvious abdominal masses. Msk:  Strength and tone appear normal for age. Extremities: No clubbing or cyanosis. No edema.  Distal pedal pulses are 2+ and equal bilaterally. Neuro: Alert and oriented X 3. Moves all extremities spontaneously. Psych:  Responds to questions appropriately with a normal affect.   No intake or output data in the 24 hours ending 03/11/12 0711  Inpatient Medications:    . aspirin  324 mg Oral Once  . aspirin EC  81 mg Oral Daily  . atorvastatin  20 mg Oral Daily  . heparin  5,000 Units Subcutaneous Q8H  . latanoprost  1 drop Both Eyes QHS  . lisinopril  10 mg Oral Daily  .  morphine injection  4 mg Intravenous Once    . mulitivitamin with minerals  1 tablet Oral Daily  . omega-3 acid ethyl esters  2 g Oral Daily  . DISCONTD: aspirin  324 mg Oral NOW  . DISCONTD: aspirin  300 mg Rectal NOW    Labs:  Basename 03/11/12 0530 03/10/12 2142  NA -- 135  K -- 4.2  CL -- 101  CO2 -- 26  GLUCOSE -- 135*  BUN -- 17  CREATININE 0.88 1.10  CALCIUM -- 9.2  MG -- --  PHOS -- --    Basename 03/11/12 0530 03/10/12 2142  WBC 11.4* 10.7*  NEUTROABS -- --  HGB 13.4 13.9  HCT 39.5 41.0  MCV 83.7 85.1  PLT 172 193  Initial POC troponin negative - f/u set not resulted yet?  Radiology/Studies:  1. Chest 2 View 03/10/2012  *RADIOLOGY REPORT*  Clinical Data: Chest pain and nausea and  CHEST - 2 VIEW  Comparison: 07/1975  Findings: Slightly shallow inspiration.  Normal heart size and pulmonary vascularity.  No focal airspace consolidation in the lungs.  No blunting of costophrenic angles.  No pneumothorax.  No significant change since prior study.  IMPRESSION: No evidence of active pulmonary disease.  Original Report Authenticated By: Marlon Pel, M.D.   CATH REPORT 2006:  RESULTS:  1.  Left main:  Normal.  2.  LAD:  Proximal luminal irregularities followed by a 60% stenosis and mid 100% obstruction with visible thrombus at the bifurcation with moderate size diagonal.  3.  Diagonal-1:  Mild luminal irregularities.  4.  Left circumflex:  Nondominant, mild proximal luminal irregularities with mid 40% stenosis prior to OM-1.  5.  OM-1:  Mild luminal irregularities.  6.  RCA:  Dominant with mild luminal irregularities.  7.  LV:  EF is 55%.  No wall motion abnormalities.  LVEDP was 20 mmHg.  Anterior wall was functioning well. CONCLUSION:  Successful PTCA and stenting of the mid and proximal left anterior descending artery.  The patient is stable leaving the catheterization laboratory.   Assessment and Plan   1. Epigastric pain/nausea & vomiting somewhat atypical but reminded patient of prior angina -  Continue ASA, statin. Add on LFTs, UA (although not clear cut cholecystitis or nephrolithiasis). Have clarified with lab that next set of cardiac enzymes are pending as we only have one POC troponin this far - they are adding on to 5am blood. No BB secondary to baseline bradycardia. Will heparinize if CE's turn positive or pain returns. Will keep NPO until Dr. Elease Hashimoto sees to decide on further ischemic workup.  2. H/o CAD s/p PTCA/Taxus stent to mid-prox LAD 2006 with normal EF at that time, see above. 3. Bicuspid AV - Doubt significant valvular pathology involvement, echo pending. Will need periodic OP monitoring beyond hospitalization. 4. Leukocytosis - unclear etiology/baseline. Afebrile, question if related to acute stress of underlying illness. CXR clear. Send off UA. 5. Hyperglycemia - No hx of DM. F/u labs in AM, if still elevated suggest A1C.  Signed, Ronie Spies PA-C  Attending Note:   The patient was seen and examined.  Agree with assessment and plan as noted above.  Pt had 3-4 hours of CP - similar to his presenting pain several years ago prior to stenting.  Enzymes are negative so far.  No further episodes of CP.    Hx of bicuspid AV - no significant murmur this am on exam.  No ECG changes  Will try to arrange for a stress myoview.  Will ambulate, continue to check labs.    Vesta Mixer, Montez Hageman., MD, [facility] 03/11/2012, 8:08 AM

## 2012-03-11 NOTE — Progress Notes (Signed)
Pt being DC'd home.   All instructions given and reviewed.

## 2012-03-12 ENCOUNTER — Telehealth: Payer: Self-pay | Admitting: Cardiovascular Disease

## 2012-04-05 ENCOUNTER — Encounter: Payer: Self-pay | Admitting: Cardiovascular Disease

## 2012-04-05 ENCOUNTER — Ambulatory Visit (INDEPENDENT_AMBULATORY_CARE_PROVIDER_SITE_OTHER): Payer: Medicare Other | Admitting: Cardiovascular Disease

## 2012-04-05 VITALS — BP 141/80 | HR 63 | Ht 70.0 in | Wt 220.6 lb

## 2012-04-05 DIAGNOSIS — I2510 Atherosclerotic heart disease of native coronary artery without angina pectoris: Secondary | ICD-10-CM

## 2012-04-05 DIAGNOSIS — E785 Hyperlipidemia, unspecified: Secondary | ICD-10-CM

## 2012-04-05 NOTE — Patient Instructions (Signed)
Your physician wants you to follow-up in: 6 MONTHS.  You will receive a reminder letter in the mail two months in advance. If you don't receive a letter, please call our office to schedule the follow-up appointment.  Your physician recommends that you continue on your current medications as directed. Please refer to the Current Medication list given to you today.  

## 2012-04-05 NOTE — Assessment & Plan Note (Signed)
Anthony Carpenter is doing well.  No further episodes of cp.  We'll see him again in 6 months. We'll check an EKG and fasting labs at that time.

## 2012-04-05 NOTE — Progress Notes (Signed)
Anthony Carpenter Date of Birth  1941/06/30 [facility]     [place] Office  [address]    [address]   [address] [place], [place]  [address]    [place], [place]  [address] [phone]  Fax  [phone]  [phone]  Fax [phone]  Problem list:  1. Coronary artery disease-status post PTCA and stenting of his mid LAD. We placed a 2.75 x 20 mm Taxus stent. It was post dilated using a 3.0 Quantum Maverick, 07/28/05 2. Hyperlipidemia 3. Possible bicuspid aortic valve   History of Present Illness:  Pt is doing well.  No chest pain or dyspnea.  He plans on putting in a garden this summer .  He's building his house this year.  He was admitted to the hospital with some chest pain/abdominal pain. A Myoview study did not reveal any ischemia.  He has felt better.  No further episodes of chest pain.  He forgot to take his meds this am.  Current Outpatient Prescriptions on File Prior to Visit  Medication Sig Dispense Refill  . aspirin EC 81 MG tablet Take 81 mg by mouth daily.      Marland Kitchen atorvastatin (LIPITOR) 40 MG tablet Take 20 mg by mouth daily.      . dorzolamide-timolol (COSOPT) 22.3-6.8 MG/ML ophthalmic solution Apply 1 drop to eye 2 (two) times daily.       Marland Kitchen latanoprost (XALATAN) 0.005 % ophthalmic solution Place 1 drop into both eyes at bedtime.       Marland Kitchen lisinopril (PRINIVIL,ZESTRIL) 10 MG tablet Take 10 mg by mouth daily.      . Multiple Vitamin (MULITIVITAMIN WITH MINERALS) TABS Take 1 tablet by mouth daily.      . nitroGLYCERIN (NITROSTAT) 0.4 MG SL tablet Place 0.4 mg under the tongue every 5 (five) minutes as needed. For chest pain.      Marland Kitchen omega-3 acid ethyl esters (LOVAZA) 1 G capsule Take 2 g by mouth daily.        No Known Allergies  Past Medical History  Diagnosis Date  . Hyperlipidemia   . Coronary artery disease     CAD s/p PTCA/Taxus stent to mid-prox LAD 2006  . Bradycardia   . Bicuspid aortic valve     Past Surgical History  Procedure Date  .  Cardiac catheterization 07/28/2005    Est EF of 55% -- Single-vessel obstructive disease in the proximal/mid left anterior descending -- Preserved left ventricular systolic function with good anterior wall motion  . Coronary angioplasty with stent placement 07/28/2005    Successful PTCA and stenting of the mid and proximal left anterior descending artery.  The patient is stable leaving the catheterization laboratory -- Vesta Mixer, M.D.    . Cataracts     History  Smoking status  . Never Smoker   Smokeless tobacco  . Never Used    History  Alcohol Use No    Family History  Problem Relation Age of Onset  . Cancer    . Heart disease Neg Hx     Reviw of Systems:  Reviewed in the HPI.  All other systems are negative.  Physical Exam: Blood pressure 141/80, pulse 63, height 5\' 10"  (1.778 m), weight 220 lb 9.6 oz (100.064 kg). General: Well developed, well nourished, in no acute distress.  Head: Normocephalic, atraumatic, sclera non-icteric, mucus membranes are moist,   Neck: Supple. Negative for carotid bruits. JVD not elevated.  Lungs: Clear bilaterally to auscultation  without wheezes, rales, or rhonchi. Breathing is unlabored.  Heart: RRR with S1 S2. No murmurs, rubs, or gallops appreciated.  Abdomen: Soft, non-tender, non-distended with normoactive bowel sounds. No hepatomegaly. No rebound/guarding. No obvious abdominal masses.  Msk:  Strength and tone appear normal for age.  Extremities: No clubbing or cyanosis. No edema.  Distal pedal pulses are 2+ and equal bilaterally.  Neuro: Alert and oriented X 3. Moves all extremities spontaneously.  Psych:  Responds to questions appropriately with a normal affect.  ECG:  Assessment / Plan:

## 2012-04-05 NOTE — Assessment & Plan Note (Signed)
Continue atorvastatin.  Recheck labs in 6 months.

## 2012-08-03 ENCOUNTER — Other Ambulatory Visit: Payer: Self-pay | Admitting: Cardiovascular Disease

## 2012-08-05 ENCOUNTER — Other Ambulatory Visit: Payer: Self-pay | Admitting: Cardiovascular Disease

## 2012-08-05 MED ORDER — LISINOPRIL 10 MG PO TABS: 10.0000 mg | ORAL_TABLET | Freq: Every day | ORAL | Status: DC

## 2012-08-05 MED ORDER — OMEGA-3-ACID ETHYL ESTERS 1 G PO CAPS: 1.0000 g | ORAL_CAPSULE | Freq: Every day | ORAL | Status: DC

## 2012-08-05 MED ORDER — ATORVASTATIN CALCIUM 40 MG PO TABS: 40.0000 mg | ORAL_TABLET | Freq: Every day | ORAL | Status: DC

## 2012-08-05 NOTE — Telephone Encounter (Signed)
Fax Received. Refill Completed. Farrie Sann Chowoe (R.M.A)   

## 2012-08-05 NOTE — Telephone Encounter (Signed)
Fax Received. Refill Completed. Anthony Carpenter (R.M.A)   

## 2012-10-01 NOTE — Telephone Encounter (Signed)
Med refill

## 2012-11-25 ENCOUNTER — Telehealth: Payer: Self-pay | Admitting: Cardiovascular Disease

## 2012-11-25 MED ORDER — LISINOPRIL 10 MG PO TABS: 10.0000 mg | ORAL_TABLET | Freq: Every day | ORAL | Status: DC

## 2012-11-25 MED ORDER — ATORVASTATIN CALCIUM 40 MG PO TABS: 40.0000 mg | ORAL_TABLET | Freq: Every day | ORAL | Status: DC

## 2012-11-25 MED ORDER — OMEGA-3-ACID ETHYL ESTERS 1 G PO CAPS: 2.0000 g | ORAL_CAPSULE | Freq: Every day | ORAL | Status: DC

## 2012-11-25 NOTE — Telephone Encounter (Signed)
Pt needs refills on lovaza, atorvastatin, lisinopril, [facility] 90 day supply, pt out of one can get them called in today?

## 2012-11-25 NOTE — Telephone Encounter (Signed)
NEED APPOINTMENT BY 03-2013. Fax Received. Refill Completed. Khamora Karan Chowoe (R.M.A)

## 2012-11-26 ENCOUNTER — Telehealth: Payer: Self-pay | Admitting: Cardiovascular Disease

## 2012-11-26 MED ORDER — LISINOPRIL 10 MG PO TABS: 10.0000 mg | ORAL_TABLET | Freq: Every day | ORAL | Status: DC

## 2012-11-26 MED ORDER — OMEGA-3-ACID ETHYL ESTERS 1 G PO CAPS: 2.0000 g | ORAL_CAPSULE | Freq: Every day | ORAL | Status: DC

## 2012-11-26 MED ORDER — ATORVASTATIN CALCIUM 40 MG PO TABS: 40.0000 mg | ORAL_TABLET | Freq: Every day | ORAL | Status: DC

## 2012-11-26 NOTE — Telephone Encounter (Signed)
Need appointment 03-2013. Fax Received. Refill Completed. Anthony Carpenter (R.M.A)

## 2012-11-26 NOTE — Telephone Encounter (Signed)
Follow-up:   Patient called in to have [facility] in [facility] [place], [place] because his insurance has changed this year.  Please call back if you have any questions.  Melissa A Tatum 11/25/2012 9:23 AM Signed  Pt needs refills on lovaza, atorvastatin, lisinopril, [facility] 90 day supply, pt out of one can get them called in today?

## 2013-01-15 ENCOUNTER — Encounter: Payer: Self-pay | Admitting: Cardiovascular Disease

## 2013-01-15 ENCOUNTER — Ambulatory Visit (INDEPENDENT_AMBULATORY_CARE_PROVIDER_SITE_OTHER): Payer: Medicare Other | Admitting: Cardiovascular Disease

## 2013-01-15 VITALS — BP 124/62 | HR 55 | Ht 70.0 in | Wt 222.8 lb

## 2013-01-15 DIAGNOSIS — I2510 Atherosclerotic heart disease of native coronary artery without angina pectoris: Secondary | ICD-10-CM

## 2013-01-15 DIAGNOSIS — E785 Hyperlipidemia, unspecified: Secondary | ICD-10-CM

## 2013-01-15 LAB — BASIC METABOLIC PANEL
GFR: 86.86 mL/min (ref >60.00)
Glucose, Bld: 93 mg/dL (ref 70–99)
Potassium: 3.9 mEq/L (ref 3.5–5.1)
Sodium: 138 mEq/L (ref 135–145)

## 2013-01-15 LAB — LIPID PANEL
Cholesterol: 132 mg/dL (ref 0–200)
HDL: 37.3 mg/dL — ABNORMAL LOW (ref >39.00)
Triglycerides: 55 mg/dL (ref 0.0–149.0)
VLDL: 11 mg/dL (ref 0.0–40.0)

## 2013-01-15 LAB — HEPATIC FUNCTION PANEL
ALT: 19 U/L (ref 0–53)
AST: 23 U/L (ref 0–37)
Albumin: 3.8 g/dL (ref 3.5–5.2)
Total Bilirubin: 0.6 mg/dL (ref 0.3–1.2)
Total Protein: 6.9 g/dL (ref 6.0–8.3)

## 2013-01-15 MED ORDER — NITROGLYCERIN 0.4 MG SL SUBL: 0.4000 mg | SUBLINGUAL_TABLET | SUBLINGUAL | Status: DC | PRN

## 2013-01-15 MED ORDER — OMEGA-3-ACID ETHYL ESTERS 1 G PO CAPS: 2.0000 g | ORAL_CAPSULE | Freq: Every day | ORAL | Status: DC

## 2013-01-15 MED ORDER — LISINOPRIL 10 MG PO TABS: 10.0000 mg | ORAL_TABLET | Freq: Every day | ORAL | Status: DC

## 2013-01-15 MED ORDER — ATORVASTATIN CALCIUM 40 MG PO TABS: 40.0000 mg | ORAL_TABLET | Freq: Every day | ORAL | Status: DC

## 2013-01-15 NOTE — Assessment & Plan Note (Signed)
Anthony Carpenter is doing well.  No angina.  Will check fasting labs today.  I will see him again in 6 months for OV and fasting labs.

## 2013-01-15 NOTE — Progress Notes (Signed)
Eber Jones Date of Birth  [birth year] [facility]     [place] Office  [address]    [address]   [address] [place], [place]  [address]    [place], [place]  [address] [phone]  Fax  [phone]  [phone]  Fax [phone]  Problem list:  1. Coronary artery disease-status post PTCA and stenting of his mid LAD. We placed a 2.75 x 20 mm Taxus stent. It was post dilated using a 3.0 Quantum Maverick, 07/28/05 2. Hyperlipidemia 3. Possible bicuspid aortic valve   History of Present Illness:  Anthony Carpenter  is doing well.  No chest pain or dyspnea.  He plans on putting in a garden this summer .  He's building his house this year.  He was admitted to the hospital with some chest pain/abdominal pain. A Myoview study did not reveal any ischemia.  He has felt better.  No further episodes of chest pain.  He forgot to take his meds this am.  Feb. 26, 2014: Anthony Carpenter  is doing well.  He has not had any angina.  He remains active - still building houses.    Current Outpatient Prescriptions on File Prior to Visit  Medication Sig Dispense Refill  . aspirin EC 81 MG tablet Take 81 mg by mouth daily.      Marland Kitchen atorvastatin (LIPITOR) 40 MG tablet Take 1 tablet (40 mg total) by mouth daily.  90 tablet  1  . dorzolamide-timolol (COSOPT) 22.3-6.8 MG/ML ophthalmic solution Apply 1 drop to eye 2 (two) times daily.       Marland Kitchen latanoprost (XALATAN) 0.005 % ophthalmic solution Place 1 drop into both eyes at bedtime.       Marland Kitchen lisinopril (PRINIVIL,ZESTRIL) 10 MG tablet Take 1 tablet (10 mg total) by mouth daily.  90 tablet  1  . Multiple Vitamin (MULITIVITAMIN WITH MINERALS) TABS Take 1 tablet by mouth daily.      . nitroGLYCERIN (NITROSTAT) 0.4 MG SL tablet Place 0.4 mg under the tongue every 5 (five) minutes as needed. For chest pain.      Marland Kitchen omega-3 acid ethyl esters (LOVAZA) 1 G capsule Take 2 capsules (2 g total) by mouth daily.  180 capsule  1   No current facility-administered medications on  file prior to visit.    No Known Allergies  Past Medical History  Diagnosis Date  . Hyperlipidemia   . Coronary artery disease     CAD s/p PTCA/Taxus stent to mid-prox LAD 2006  . Bradycardia   . Bicuspid aortic valve     Past Surgical History  Procedure Laterality Date  . Cardiac catheterization  07/28/2005    Est EF of 55% -- Single-vessel obstructive disease in the proximal/mid left anterior descending -- Preserved left ventricular systolic function with good anterior wall motion  . Coronary angioplasty with stent placement  07/28/2005    Successful PTCA and stenting of the mid and proximal left anterior descending artery.  The patient is stable leaving the catheterization laboratory -- Vesta Mixer, M.D.    . Cataracts      History  Smoking status  . Never Smoker   Smokeless tobacco  . Never Used    History  Alcohol Use No    Family History  Problem Relation Age of Onset  . Cancer    . Heart disease Neg Hx     Reviw of Systems:  Reviewed in the HPI.  All other systems are negative.  Physical  Exam: Blood pressure 124/62, pulse 55, height 5\' 10"  (1.778 m), weight 222 lb 12.8 oz (101.061 kg). General: Well developed, well nourished, in no acute distress.  Head: Normocephalic, atraumatic, sclera non-icteric, mucus membranes are moist,   Neck: Supple. Negative for carotid bruits. JVD not elevated.  Lungs: Clear bilaterally to auscultation without wheezes, rales, or rhonchi. Breathing is unlabored.  Heart: RRR with S1 S2. No murmurs, rubs, or gallops appreciated.  Abdomen: Soft, non-tender, non-distended with normoactive bowel sounds. No hepatomegaly. No rebound/guarding. No obvious abdominal masses.  Msk:  Strength and tone appear normal for age.  Extremities: No clubbing or cyanosis. No edema.  Distal pedal pulses are 2+ and equal bilaterally.  Neuro: Alert and oriented X 3. Moves all extremities spontaneously.  Psych:  Responds to questions  appropriately with a normal affect.  ECG: Feb. 26, 2014: Sinus brady at 55. Sinus arrhythmia,  Otherwise normal ECG  Assessment / Plan:

## 2013-01-15 NOTE — Patient Instructions (Addendum)
Your physician recommends that you return for a FASTING lipid profile: today and in 6 months   Your physician wants you to follow-up in: 6 months  You will receive a reminder letter in the mail two months in advance. If you don't receive a letter, please call our office to schedule the follow-up appointment.  Your physician recommends that you continue on your current medications as directed. Please refer to the Current Medication list given to you today.  

## 2013-07-14 ENCOUNTER — Encounter: Payer: Self-pay | Admitting: Cardiovascular Disease

## 2013-07-14 ENCOUNTER — Ambulatory Visit (INDEPENDENT_AMBULATORY_CARE_PROVIDER_SITE_OTHER): Payer: BC Managed Care – PPO | Admitting: Cardiovascular Disease

## 2013-07-14 VITALS — BP 130/66 | HR 56 | Ht 70.0 in | Wt 218.0 lb

## 2013-07-14 DIAGNOSIS — I359 Nonrheumatic aortic valve disorder, unspecified: Secondary | ICD-10-CM

## 2013-07-14 DIAGNOSIS — I358 Other nonrheumatic aortic valve disorders: Secondary | ICD-10-CM | POA: Insufficient documentation

## 2013-07-14 DIAGNOSIS — E785 Hyperlipidemia, unspecified: Secondary | ICD-10-CM

## 2013-07-14 DIAGNOSIS — I2510 Atherosclerotic heart disease of native coronary artery without angina pectoris: Secondary | ICD-10-CM

## 2013-07-14 LAB — HEPATIC FUNCTION PANEL
ALT: 14 U/L (ref 0–53)
Alkaline Phosphatase: 49 U/L (ref 39–117)
Bilirubin, Direct: 0 mg/dL (ref 0.0–0.3)
Total Bilirubin: 0.5 mg/dL (ref 0.3–1.2)
Total Protein: 7 g/dL (ref 6.0–8.3)

## 2013-07-14 LAB — BASIC METABOLIC PANEL
BUN: 15 mg/dL (ref 6–23)
Creatinine, Ser: 1 mg/dL (ref 0.4–1.5)
GFR: 76.91 mL/min (ref >60.00)
Glucose, Bld: 92 mg/dL (ref 70–99)

## 2013-07-14 LAB — LIPID PANEL
Cholesterol: 122 mg/dL (ref 0–200)
VLDL: 14.4 mg/dL (ref 0.0–40.0)

## 2013-07-14 NOTE — Assessment & Plan Note (Signed)
Anthony Carpenter is done very well. He's not had any episodes of chest pain or shortness of breath. We'll continue the same medications. I'll see him again in one year for followup visit.

## 2013-07-14 NOTE — Assessment & Plan Note (Signed)
His last LDL was 89. We'll check fasting lipids today. If his LDL still in the mid 80s, we will increase his atorvastatin 40 mg a day. We'll continue with his other medications as noted. I'll see him again in one year for followup office visit, fasting labs, and EKG.

## 2013-07-14 NOTE — Assessment & Plan Note (Signed)
Anthony Carpenter was thought to have a bicuspid aortic valve in the past. I personally reviewed the echocardiogram from 2013 and the valve does not appear to be bicuspid to me. It certainly functions normally. It will be important for [place] to continue to follow this. If he does have a bicuspid aortic valve and we will should take a closer look at his aorta to look for evidence of aneurysms or other abnormalities. At present we will follow him clinically. We'll anticipate doing an echocardiogram in the next year or so.

## 2013-07-14 NOTE — Patient Instructions (Signed)
Your physician recommends that you return for a FASTING lipid profile: TODAY AND IN 1 YEAR   Your physician wants you to follow-up in: 1 YEAR  You will receive a reminder letter in the mail two months in advance. If you don't receive a letter, please call our office to schedule the follow-up appointment.   

## 2013-07-14 NOTE — Progress Notes (Signed)
Anthony Carpenter Date of Birth  Jun 27, 1941 [facility]     [place] Office  [address]    [address]   [address] [place], [place]  [address]    [place], [place]  [address] [phone]  Fax  [phone]  [phone]  Fax [phone]  Problem list:  1. Coronary artery disease-status post PTCA and stenting of his mid LAD. We placed a 2.75 x 20 mm Taxus stent. It was post dilated using a 3.0 Quantum Maverick, 07/28/05 2. Hyperlipidemia 3. Possible bicuspid aortic valve   History of Present Illness:  Anthony Carpenter  is doing well.  No chest pain or dyspnea.  He plans on putting in a garden this summer .  He's building his house this year.  He was admitted to the hospital with some chest pain/abdominal pain. A Myoview study did not reveal any ischemia.  He has felt better.  No further episodes of chest pain.  He forgot to take his meds this am.  Feb. 26, 2014: Anthony Carpenter  is doing well.  He has not had any angina.  He remains active - still building houses.    July 14, 2013:  Anthony Carpenter is doing  Well.  No angina.    Current Outpatient Prescriptions on File Prior to Visit  Medication Sig Dispense Refill  . aspirin EC 81 MG tablet Take 81 mg by mouth daily.      . dorzolamide-timolol (COSOPT) 22.3-6.8 MG/ML ophthalmic solution Apply 1 drop to eye 2 (two) times daily.       Marland Kitchen latanoprost (XALATAN) 0.005 % ophthalmic solution Place 1 drop into both eyes at bedtime.       Marland Kitchen lisinopril (PRINIVIL,ZESTRIL) 10 MG tablet Take 1 tablet (10 mg total) by mouth daily.  90 tablet  3  . Multiple Vitamin (MULITIVITAMIN WITH MINERALS) TABS Take 1 tablet by mouth daily.      . nitroGLYCERIN (NITROSTAT) 0.4 MG SL tablet Place 1 tablet (0.4 mg total) under the tongue every 5 (five) minutes as needed. For chest pain.  25 tablet  8  . omega-3 acid ethyl esters (LOVAZA) 1 G capsule Take 2 capsules (2 g total) by mouth daily.  180 capsule  3   No current facility-administered medications on file prior to  visit.    No Known Allergies  Past Medical History  Diagnosis Date  . Hyperlipidemia   . Coronary artery disease     CAD s/p PTCA/Taxus stent to mid-prox LAD 2006  . Bradycardia   . Bicuspid aortic valve     Past Surgical History  Procedure Laterality Date  . Cardiac catheterization  07/28/2005    Est EF of 55% -- Single-vessel obstructive disease in the proximal/mid left anterior descending -- Preserved left ventricular systolic function with good anterior wall motion  . Coronary angioplasty with stent placement  07/28/2005    Successful PTCA and stenting of the mid and proximal left anterior descending artery.  The patient is stable leaving the catheterization laboratory -- Vesta Mixer, M.D.    . Cataracts      History  Smoking status  . Never Smoker   Smokeless tobacco  . Never Used    History  Alcohol Use No    Family History  Problem Relation Age of Onset  . Cancer    . Heart disease Neg Hx     Reviw of Systems:  Reviewed in the HPI.  All other systems are negative.  Physical Exam: Blood  pressure 130/66, pulse 56, height 5\' 10"  (1.778 m), weight 218 lb (98.884 kg). General: Well developed, well nourished, in no acute distress.  Head: Normocephalic, atraumatic, sclera non-icteric, mucus membranes are moist,   Neck: Supple. Negative for carotid bruits. JVD not elevated.  Lungs: Clear bilaterally to auscultation without wheezes, rales, or rhonchi. Breathing is unlabored.  Heart: RRR with S1 S2. No murmurs, rubs, or gallops appreciated.  Abdomen: Soft, non-tender, non-distended with normoactive bowel sounds. No hepatomegaly. No rebound/guarding. No obvious abdominal masses.  Msk:  Strength and tone appear normal for age.  Extremities: No clubbing or cyanosis. No edema.  Distal pedal pulses are 2+ and equal bilaterally.  Neuro: Alert and oriented X 3. Moves all extremities spontaneously.  Psych:  Responds to questions appropriately with a normal  affect.  ECG: Feb. 26, 2014:   Assessment / Plan:

## 2013-07-15 ENCOUNTER — Telehealth: Payer: Self-pay | Admitting: *Deleted

## 2013-07-15 NOTE — Telephone Encounter (Signed)
No answer/no voicemail @2 :56pm/PE

## 2013-07-15 NOTE — Telephone Encounter (Signed)
Message copied by Prescilla Sours on Tue Jul 15, 2013  2:54 PM ------      Message from: Vesta Mixer      Created: Tue Jul 15, 2013  2:44 PM       His labs are stable. His LDL is 70 which is at his goal. Continue with his present medications. I'll see him again in one year as already scheduled. ------

## 2013-11-24 ENCOUNTER — Other Ambulatory Visit: Payer: Self-pay | Admitting: *Deleted

## 2013-11-24 DIAGNOSIS — E785 Hyperlipidemia, unspecified: Secondary | ICD-10-CM

## 2013-11-24 MED ORDER — LISINOPRIL 10 MG PO TABS: 10.0000 mg | ORAL_TABLET | Freq: Every day | ORAL | Status: DC

## 2013-11-24 MED ORDER — ATORVASTATIN CALCIUM 40 MG PO TABS: 20.0000 mg | ORAL_TABLET | Freq: Every day | ORAL | Status: DC

## 2013-12-29 ENCOUNTER — Other Ambulatory Visit: Payer: Self-pay

## 2013-12-29 DIAGNOSIS — E785 Hyperlipidemia, unspecified: Secondary | ICD-10-CM

## 2013-12-29 MED ORDER — OMEGA-3-ACID ETHYL ESTERS 1 G PO CAPS: 2.0000 g | ORAL_CAPSULE | Freq: Every day | ORAL | Status: DC

## 2014-06-25 ENCOUNTER — Encounter: Payer: Self-pay | Admitting: *Deleted

## 2014-07-14 ENCOUNTER — Other Ambulatory Visit: Payer: Medicare Other

## 2014-07-14 ENCOUNTER — Ambulatory Visit (INDEPENDENT_AMBULATORY_CARE_PROVIDER_SITE_OTHER): Payer: Medicare Other | Admitting: Cardiovascular Disease

## 2014-07-14 ENCOUNTER — Encounter: Payer: Self-pay | Admitting: Cardiovascular Disease

## 2014-07-14 DIAGNOSIS — I2510 Atherosclerotic heart disease of native coronary artery without angina pectoris: Secondary | ICD-10-CM

## 2014-07-14 DIAGNOSIS — E785 Hyperlipidemia, unspecified: Secondary | ICD-10-CM

## 2014-07-14 LAB — HEPATIC FUNCTION PANEL
ALT: 15 U/L (ref 0–53)
AST: 22 U/L (ref 0–37)
Albumin: 3.9 g/dL (ref 3.5–5.2)
Alkaline Phosphatase: 47 U/L (ref 39–117)
Bilirubin, Direct: 0.1 mg/dL (ref 0.0–0.3)
Total Bilirubin: 0.8 mg/dL (ref 0.2–1.2)
Total Protein: 7.2 g/dL (ref 6.0–8.3)

## 2014-07-14 LAB — BASIC METABOLIC PANEL
BUN: 16 mg/dL (ref 6–23)
CO2: 28 mEq/L (ref 19–32)
Calcium: 9 mg/dL (ref 8.4–10.5)
Chloride: 105 mEq/L (ref 96–112)
Creatinine, Ser: 1.1 mg/dL (ref 0.4–1.5)
GFR: 70.99 mL/min (ref >60.00)
Glucose, Bld: 93 mg/dL (ref 70–99)
Potassium: 4.5 mEq/L (ref 3.5–5.1)
Sodium: 138 mEq/L (ref 135–145)

## 2014-07-14 LAB — LIPID PANEL
CHOL/HDL RATIO: 3
Cholesterol: 128 mg/dL (ref 0–200)
HDL: 37.9 mg/dL — AB (ref >39.00)
LDL CALC: 77 mg/dL (ref 0–99)
NonHDL: 90.1
TRIGLYCERIDES: 65 mg/dL (ref 0.0–149.0)
VLDL: 13 mg/dL (ref 0.0–40.0)

## 2014-07-14 NOTE — Assessment & Plan Note (Signed)
Anthony Carpenter is doing well .  He's not having episodes of chest pain. He continues to stay very busy. We'll do fasting labs today. I'll see him again in one year for office visit and repeat fasting labs.

## 2014-07-14 NOTE — Progress Notes (Signed)
Anthony Carpenter Date of Birth  [birth year] [facility]     [place] Office  [address]    [address]   [address] [place], [place]  [address]    [place], [place]  [address] [phone]  Fax  [phone]  [phone]  Fax [phone]  Problem list:  1. Coronary artery disease-status post PTCA and stenting of his mid LAD. We placed a 2.75 x 20 mm Taxus stent. It was post dilated using a 3.0 Quantum Maverick, 07/28/05 2. Hyperlipidemia 3. Possible bicuspid aortic valve   History of Present Illness:  Anthony Carpenter  is doing well.  No chest pain or dyspnea.  He plans on putting in a garden this summer .  He's building his house this year.  He was admitted to the hospital with some chest pain/abdominal pain. A Myoview study did not reveal any ischemia.  He has felt better.  No further episodes of chest pain.  He forgot to take his meds this am.  Feb. 26, 2014: Anthony Carpenter  is doing well.  He has not had any angina.  He remains active - still building houses.    July 14, 2013:  Anthony Carpenter is doing  Well.  No angina.    July 14, 2014:  Anthony Carpenter is doing well.  Still working   No CP, no dyspnea.    Current Outpatient Prescriptions on File Prior to Visit  Medication Sig Dispense Refill  . aspirin EC 81 MG tablet Take 81 mg by mouth daily.      Marland Kitchen atorvastatin (LIPITOR) 40 MG tablet Take 0.5 tablets (20 mg total) by mouth daily.  45 tablet  2  . dorzolamide-timolol (COSOPT) 22.3-6.8 MG/ML ophthalmic solution Apply 1 drop to eye 2 (two) times daily.       Marland Kitchen latanoprost (XALATAN) 0.005 % ophthalmic solution Place 1 drop into both eyes at bedtime.       Marland Kitchen lisinopril (PRINIVIL,ZESTRIL) 10 MG tablet Take 1 tablet (10 mg total) by mouth daily.  90 tablet  2  . Multiple Vitamin (MULITIVITAMIN WITH MINERALS) TABS Take 1 tablet by mouth daily.      . nitroGLYCERIN (NITROSTAT) 0.4 MG SL tablet Place 1 tablet (0.4 mg total) under the tongue every 5 (five) minutes as needed. For chest pain.  25 tablet   8  . omega-3 acid ethyl esters (LOVAZA) 1 G capsule Take 2 capsules (2 g total) by mouth daily.  180 capsule  3   No current facility-administered medications on file prior to visit.    No Known Allergies  Past Medical History  Diagnosis Date  . Hyperlipidemia   . Coronary artery disease     CAD s/p PTCA/Taxus stent to mid-prox LAD 2006  . Bradycardia   . Bicuspid aortic valve     Past Surgical History  Procedure Laterality Date  . Cardiac catheterization  07/28/2005    Est EF of 55% -- Single-vessel obstructive disease in the proximal/mid left anterior descending -- Preserved left ventricular systolic function with good anterior wall motion  . Coronary angioplasty with stent placement  07/28/2005    Successful PTCA and stenting of the mid and proximal left anterior descending artery.  The patient is stable leaving the catheterization laboratory -- Vesta Mixer, M.D.    . Cataracts      History  Smoking status  . Never Smoker   Smokeless tobacco  . Never Used    History  Alcohol Use No  Family History  Problem Relation Age of Onset  . Cancer    . Heart disease Neg Hx     Reviw of Systems:  Reviewed in the HPI.  All other systems are negative.  Physical Exam: Blood pressure 122/82, pulse 46, height  (1.778 m), weight 213 lb 6.4 oz (96.798 kg). General: Well developed, well nourished, in no acute distress.  Head: Normocephalic, atraumatic, sclera non-icteric, mucus membranes are moist,   Neck: Supple. Negative for carotid bruits. JVD not elevated.  Lungs: Clear bilaterally to auscultation without wheezes, rales, or rhonchi. Breathing is unlabored.  Heart: RRR with S1 S2. No murmurs, rubs, or gallops appreciated.  Abdomen: Soft, non-tender, non-distended with normoactive bowel sounds. No hepatomegaly. No rebound/guarding. No obvious abdominal masses.  Msk:  Strength and tone appear normal for age.  Extremities: No clubbing or cyanosis. No edema.   Distal pedal pulses are 2+ and equal bilaterally.  Neuro: Alert and oriented X 3. Moves all extremities spontaneously.  Psych:  Responds to questions appropriately with a normal affect.  ECG: July 14, 2014:  Marked sinus brady at 46.  1st degree AV block. Otherwise unremarkable.    Assessment / Plan:

## 2014-07-14 NOTE — Patient Instructions (Signed)
Your physician recommends that you continue on your current medications as directed. Please refer to the Current Medication list given to you today.  Your physician recommends that you have lab work:  TODAY - cholesterol, liver, and basic metabolic panels  Your physician wants you to follow-up in: 12 months with Dr. Elease Hashimoto.  You will receive a reminder letter in the mail two months in advance. If you don't receive a letter, please call our office to schedule the follow-up appointment.  Your physician recommends that you return for lab work in: 12 months on the day of or a few days before your office visit with Dr. Elease Hashimoto.  You will need to FAST for this appointment - nothing to eat or drink after midnight the night before except water.

## 2014-07-14 NOTE — Assessment & Plan Note (Signed)
Will check labs today

## 2014-07-31 ENCOUNTER — Other Ambulatory Visit: Payer: Self-pay | Admitting: *Deleted

## 2014-07-31 DIAGNOSIS — E785 Hyperlipidemia, unspecified: Secondary | ICD-10-CM

## 2014-07-31 MED ORDER — ATORVASTATIN CALCIUM 40 MG PO TABS: 20.0000 mg | ORAL_TABLET | Freq: Every day | ORAL | Status: DC

## 2014-07-31 MED ORDER — LISINOPRIL 10 MG PO TABS
10.0000 mg | ORAL_TABLET | Freq: Every day | ORAL | Status: DC
Start: 2014-07-31 — End: 2015-09-13

## 2014-10-01 ENCOUNTER — Emergency Department (HOSPITAL_COMMUNITY): Payer: Medicare Other

## 2014-10-01 ENCOUNTER — Inpatient Hospital Stay (HOSPITAL_COMMUNITY)
Admission: EM | Admit: 2014-10-01 | Discharge: 2014-10-03 | DRG: 287 | Disposition: A | Discharge status: Home / Self Care | Payer: Medicare Other | Attending: Cardiovascular Disease | Admitting: Cardiovascular Disease

## 2014-10-01 ENCOUNTER — Encounter (HOSPITAL_COMMUNITY): Payer: Self-pay | Admitting: Emergency Medicine

## 2014-10-01 DIAGNOSIS — Y712 Prosthetic and other implants, materials and accessory cardiovascular devices associated with adverse incidents: Secondary | ICD-10-CM | POA: Diagnosis present

## 2014-10-01 DIAGNOSIS — R079 Chest pain, unspecified: Secondary | ICD-10-CM | POA: Diagnosis present

## 2014-10-01 DIAGNOSIS — Z7982 Long term (current) use of aspirin: Secondary | ICD-10-CM | POA: Diagnosis not present

## 2014-10-01 DIAGNOSIS — I358 Other nonrheumatic aortic valve disorders: Secondary | ICD-10-CM

## 2014-10-01 DIAGNOSIS — R911 Solitary pulmonary nodule: Secondary | ICD-10-CM | POA: Diagnosis present

## 2014-10-01 DIAGNOSIS — Z955 Presence of coronary angioplasty implant and graft: Secondary | ICD-10-CM

## 2014-10-01 DIAGNOSIS — Q231 Congenital insufficiency of aortic valve: Secondary | ICD-10-CM

## 2014-10-01 DIAGNOSIS — I350 Nonrheumatic aortic (valve) stenosis: Secondary | ICD-10-CM

## 2014-10-01 DIAGNOSIS — M546 Pain in thoracic spine: Secondary | ICD-10-CM

## 2014-10-01 DIAGNOSIS — E785 Hyperlipidemia, unspecified: Secondary | ICD-10-CM | POA: Diagnosis present

## 2014-10-01 DIAGNOSIS — I25110 Atherosclerotic heart disease of native coronary artery with unstable angina pectoris: Secondary | ICD-10-CM | POA: Diagnosis present

## 2014-10-01 DIAGNOSIS — R918 Other nonspecific abnormal finding of lung field: Secondary | ICD-10-CM | POA: Diagnosis present

## 2014-10-01 DIAGNOSIS — Z7901 Long term (current) use of anticoagulants: Secondary | ICD-10-CM | POA: Diagnosis not present

## 2014-10-01 DIAGNOSIS — M549 Dorsalgia, unspecified: Secondary | ICD-10-CM

## 2014-10-01 DIAGNOSIS — I359 Nonrheumatic aortic valve disorder, unspecified: Secondary | ICD-10-CM

## 2014-10-01 DIAGNOSIS — I2510 Atherosclerotic heart disease of native coronary artery without angina pectoris: Secondary | ICD-10-CM | POA: Diagnosis present

## 2014-10-01 DIAGNOSIS — R001 Bradycardia, unspecified: Secondary | ICD-10-CM

## 2014-10-01 DIAGNOSIS — T82857A Stenosis of cardiac prosthetic devices, implants and grafts, initial encounter: Secondary | ICD-10-CM | POA: Diagnosis present

## 2014-10-01 DIAGNOSIS — R0989 Other specified symptoms and signs involving the circulatory and respiratory systems: Secondary | ICD-10-CM | POA: Diagnosis present

## 2014-10-01 DIAGNOSIS — R0789 Other chest pain: Secondary | ICD-10-CM

## 2014-10-01 DIAGNOSIS — I200 Unstable angina: Secondary | ICD-10-CM

## 2014-10-01 DIAGNOSIS — I10 Essential (primary) hypertension: Secondary | ICD-10-CM | POA: Diagnosis present

## 2014-10-01 DIAGNOSIS — I25119 Atherosclerotic heart disease of native coronary artery with unspecified angina pectoris: Secondary | ICD-10-CM

## 2014-10-01 HISTORY — DX: Other specified symptoms and signs involving the circulatory and respiratory systems: R09.89

## 2014-10-01 HISTORY — DX: Nonrheumatic aortic (valve) stenosis: I35.0

## 2014-10-01 HISTORY — DX: Solitary pulmonary nodule: R91.1

## 2014-10-01 LAB — COMPREHENSIVE METABOLIC PANEL
ALBUMIN: 3.7 g/dL (ref 3.5–5.2)
ALK PHOS: 58 U/L (ref 39–117)
ALT: 18 U/L (ref 0–53)
AST: 22 U/L (ref 0–37)
Anion gap: 13 (ref 5–15)
BUN: 17 mg/dL (ref 6–23)
CHLORIDE: 104 meq/L (ref 96–112)
CO2: 22 mEq/L (ref 19–32)
Calcium: 9 mg/dL (ref 8.4–10.5)
Creatinine, Ser: 1.06 mg/dL (ref 0.50–1.35)
GFR calc Af Amer: 78 mL/min — ABNORMAL LOW (ref >90)
GFR calc non Af Amer: 67 mL/min — ABNORMAL LOW (ref >90)
Glucose, Bld: 169 mg/dL — ABNORMAL HIGH (ref 70–99)
POTASSIUM: 4.2 meq/L (ref 3.7–5.3)
SODIUM: 139 meq/L (ref 137–147)
Total Bilirubin: 0.3 mg/dL (ref 0.3–1.2)
Total Protein: 6.6 g/dL (ref 6.0–8.3)

## 2014-10-01 LAB — LIPASE, BLOOD: Lipase: 22 U/L (ref 11–59)

## 2014-10-01 LAB — CBC
HCT: 42.3 % (ref 39.0–52.0)
HEMOGLOBIN: 14 g/dL (ref 13.0–17.0)
MCH: 29.8 pg (ref 26.0–34.0)
MCHC: 33.1 g/dL (ref 30.0–36.0)
MCV: 90 fL (ref 78.0–100.0)
Platelets: 136 10*3/uL — ABNORMAL LOW (ref 150–400)
RBC: 4.7 MIL/uL (ref 4.22–5.81)
RDW: 13.7 % (ref 11.5–15.5)
WBC: 9.4 10*3/uL (ref 4.0–10.5)

## 2014-10-01 LAB — I-STAT TROPONIN, ED: Troponin i, poc: 0.01 ng/mL (ref 0.00–0.08)

## 2014-10-01 LAB — TROPONIN I: Troponin I: <0.3 ng/mL (ref <0.30)

## 2014-10-01 LAB — PROTIME-INR
INR: 1.1 (ref 0.00–1.49)
Prothrombin Time: 14.3 seconds (ref 11.6–15.2)

## 2014-10-01 LAB — PRO B NATRIURETIC PEPTIDE: Pro B Natriuretic peptide (BNP): 241.6 pg/mL — ABNORMAL HIGH (ref 0–125)

## 2014-10-01 MED ORDER — ATROPINE SULFATE 1 MG/ML IJ SOLN
1.0000 mg | Freq: Once | INTRAMUSCULAR | Status: AC
  Administered 2014-10-01: 1 mg via INTRAVENOUS
  Filled 2014-10-01: qty 1

## 2014-10-01 MED ORDER — ASPIRIN 81 MG PO CHEW
324.0000 mg | CHEWABLE_TABLET | Freq: Once | ORAL | Status: AC
  Administered 2014-10-01: 324 mg via ORAL
  Filled 2014-10-01: qty 4

## 2014-10-01 MED ORDER — ACTIVE PARTNERSHIP FOR HEALTH OF YOUR HEART BOOK
Freq: Once | Status: AC
  Administered 2014-10-01: 1
  Filled 2014-10-01: qty 1

## 2014-10-01 MED ORDER — ATORVASTATIN CALCIUM 20 MG PO TABS
20.0000 mg | ORAL_TABLET | Freq: Every day | ORAL | Status: DC
  Administered 2014-10-01 – 2014-10-02 (×2): 20 mg via ORAL
  Filled 2014-10-01 (×2): qty 1

## 2014-10-01 MED ORDER — DORZOLAMIDE HCL-TIMOLOL MAL 2-0.5 % OP SOLN
1.0000 [drp] | Freq: Two times a day (BID) | OPHTHALMIC | Status: DC
  Administered 2014-10-01 – 2014-10-03 (×5): 1 [drp] via OPHTHALMIC
  Filled 2014-10-01 (×2): qty 10

## 2014-10-01 MED ORDER — OMEGA-3-ACID ETHYL ESTERS 1 G PO CAPS
2.0000 g | ORAL_CAPSULE | Freq: Every day | ORAL | Status: DC
  Administered 2014-10-01 – 2014-10-03 (×3): 2 g via ORAL
  Filled 2014-10-01 (×3): qty 2

## 2014-10-01 MED ORDER — ONDANSETRON HCL 4 MG/2ML IJ SOLN: 4.0000 mg | Freq: Four times a day (QID) | INTRAMUSCULAR | Status: DC | PRN

## 2014-10-01 MED ORDER — ONDANSETRON HCL 4 MG/2ML IJ SOLN
4.0000 mg | Freq: Once | INTRAMUSCULAR | Status: AC
  Administered 2014-10-01: 4 mg via INTRAVENOUS
  Filled 2014-10-01: qty 2

## 2014-10-01 MED ORDER — MORPHINE SULFATE 4 MG/ML IJ SOLN
4.0000 mg | Freq: Once | INTRAMUSCULAR | Status: AC
  Administered 2014-10-01: 4 mg via INTRAVENOUS
  Filled 2014-10-01: qty 1

## 2014-10-01 MED ORDER — LISINOPRIL 10 MG PO TABS
10.0000 mg | ORAL_TABLET | Freq: Every day | ORAL | Status: DC
  Administered 2014-10-01 – 2014-10-03 (×3): 10 mg via ORAL
  Filled 2014-10-01 (×3): qty 1

## 2014-10-01 MED ORDER — SODIUM CHLORIDE 0.9 % IV SOLN
INTRAVENOUS | Status: DC
Start: 2014-10-01 — End: 2014-10-03
  Administered 2014-10-01: 50 mL/h via INTRAVENOUS

## 2014-10-01 MED ORDER — EXERCISE FOR HEART AND HEALTH BOOK
Freq: Once | Status: AC
  Administered 2014-10-01: 22:00:00
  Filled 2014-10-01 (×2): qty 1

## 2014-10-01 MED ORDER — HEPARIN SODIUM (PORCINE) 5000 UNIT/ML IJ SOLN
5000.0000 [IU] | Freq: Three times a day (TID) | INTRAMUSCULAR | Status: DC
  Administered 2014-10-01 – 2014-10-03 (×6): 5000 [IU] via SUBCUTANEOUS
  Filled 2014-10-01 (×6): qty 1

## 2014-10-01 MED ORDER — NITROGLYCERIN 2 % TD OINT
1.0000 [in_us] | TOPICAL_OINTMENT | Freq: Four times a day (QID) | TRANSDERMAL | Status: DC
  Administered 2014-10-01 – 2014-10-02 (×2): 1 [in_us] via TOPICAL
  Filled 2014-10-01: qty 30
  Filled 2014-10-01: qty 1

## 2014-10-01 MED ORDER — ASPIRIN EC 81 MG PO TBEC
81.0000 mg | DELAYED_RELEASE_TABLET | Freq: Every day | ORAL | Status: DC
  Administered 2014-10-02 – 2014-10-03 (×2): 81 mg via ORAL
  Filled 2014-10-01 (×3): qty 1

## 2014-10-01 MED ORDER — BLOOD PRESSURE CONTROL BOOK
Freq: Once | Status: AC
  Administered 2014-10-01: 1
  Filled 2014-10-01: qty 1

## 2014-10-01 MED ORDER — NITROGLYCERIN 0.4 MG SL SUBL: 0.4000 mg | SUBLINGUAL_TABLET | SUBLINGUAL | Status: DC | PRN

## 2014-10-01 MED ORDER — ACETAMINOPHEN 325 MG PO TABS: 650.0000 mg | ORAL_TABLET | ORAL | Status: DC | PRN

## 2014-10-01 MED ORDER — IOHEXOL 350 MG/ML SOLN
100.0000 mL | Freq: Once | INTRAVENOUS | Status: AC | PRN
  Administered 2014-10-01: 100 mL via INTRAVENOUS

## 2014-10-01 MED ORDER — ASPIRIN 325 MG PO TABS: 325.0000 mg | ORAL_TABLET | ORAL | Status: DC

## 2014-10-01 MED ORDER — LATANOPROST 0.005 % OP SOLN
1.0000 [drp] | Freq: Every day | OPHTHALMIC | Status: DC
  Administered 2014-10-01 – 2014-10-02 (×2): 1 [drp] via OPHTHALMIC
  Filled 2014-10-01 (×2): qty 2.5

## 2014-10-01 NOTE — Progress Notes (Signed)
*  PRELIMINARY RESULTS* Echocardiogram 2D Echocardiogram has been performed.  Anthony Carpenter, Anthony Carpenter 10/01/2014, 3:45 PM

## 2014-10-01 NOTE — ED Notes (Signed)
Spoke with cardiology d/t pt still having chest pain.  States he will change room to 3W stepdown.  House coverage called.

## 2014-10-01 NOTE — Progress Notes (Signed)
UR Completed Wren Gallaga Graves-Bigelow, RN,BSN [phone]  

## 2014-10-01 NOTE — Care Management Note (Unsigned)
    Page 1 of 1   10/01/2014     4:05:51 PM CARE MANAGEMENT NOTE 10/01/2014  Patient:  Anthony Carpenter,Anthony Carpenter   Account Number:  [id number]  Date Initiated:  10/01/2014  Documentation initiated by:  GRAVES-BIGELOW,Wenonah Milo  Subjective/Objective Assessment:   Pt admitted for unstable angina. Monitoring cardiac enzymes.     Action/Plan:   CM will continue to monitor for disposition needs. Pt was very active before admission.   Anticipated DC Date:  10/03/2014   Anticipated DC Plan:  HOME/SELF CARE      DC Planning Services  CM consult      Choice offered to / List presented to:             Status of service:  In process, will continue to follow Medicare Important Message given?   (If response is "NO", the following Medicare IM given date fields will be blank) Date Medicare IM given:   Medicare IM given by:   Date Additional Medicare IM given:   Additional Medicare IM given by:    Discharge Disposition:    Per UR Regulation:  Reviewed for med. necessity/level of care/duration of stay  If discussed at Long Length of Stay Meetings, dates discussed:    Comments:

## 2014-10-01 NOTE — H&P (Signed)
Chief Complaint:  Chest pain at rest  Cardiologist: Nahser  HPI:  This is a 73 y.o. male with a past medical history significant for coronary artery disease status post drug-eluting stent to the mid LAD in 2006, without subsequent coronary events. He was woken from sleep around 2:30 this morning with chest discomfort, followed by 2 episodes of emesis. 3 sublingual nitroglycerin provided only partial relief. Additional relief appeared to occur after topical nitrates were placed in the emergency room. At this point he has mild residual discomfort. His initial cardiac troponin was normal. His electrocardiogram shows chronic subtle ST segment elevation in the inferior leads but no acute ST segment changes. He has no clinical findings to suggest heart failure. In the emergency room his heart rate has been as low as 41 bpm, typically in the low 50s.  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 He has not had recent exertional problems with dyspnea or chest discomfort. He has had a heart murmur for a long time and is suspected to have a possibly bicuspid aortic valve. He has no history of peripheral artery disease or intermittent claudication. He has never had a stroke or TIA. He has treated hyperlipidemia.   PMHx:  Past Medical History  Diagnosis Date  . Hyperlipidemia   . Coronary artery disease     CAD s/p PTCA/Taxus stent to mid-prox LAD 2006  . Bradycardia   . Bicuspid aortic valve     Past Surgical History  Procedure Laterality Date  . Cardiac catheterization  07/28/2005    Est EF of 55% -- Single-vessel obstructive disease in the proximal/mid left anterior descending -- Preserved left ventricular systolic function with good anterior wall motion  . Coronary angioplasty with stent placement   07/28/2005    Successful PTCA and stenting of the mid and proximal left anterior descending artery.  The patient is stable leaving the catheterization laboratory -- Thayer Headings, M.D.    . Cataracts      FAMHx:  Family History  Problem Relation Age of Onset  . Cancer    . Heart disease Neg Hx     SOCHx:   reports that he has never smoked. He has never used smokeless tobacco. He reports that he does not drink alcohol or use illicit drugs.  ALLERGIES:  No Known Allergies  ROS: Pertinent items are noted in HPI.  The patient specifically denies a dyspnea at rest or with exertion, orthopnea, paroxysmal nocturnal dyspnea, syncope, palpitations, focal neurological deficits, intermittent claudication, lower extremity edema, unexplained weight gain, cough, hemoptysis or wheezing.  The patient also denies abdominal pain, dysphagia, diarrhea, constipation, polyuria, polydipsia, dysuria, hematuria, frequency, urgency, abnormal bleeding or bruising, fever, chills, unexpected weight changes, mood swings, change in skin or hair texture, change in voice quality, auditory or visual problems, allergic reactions or rashes, new musculoskeletal complaints other than usual "aches and pains".    HOME MEDS:  (Not in a hospital admission)  LABS/IMAGING: Results for orders placed or performed during the hospital encounter of 10/01/14 (from the past 48 hour(s))  CBC     Status: Abnormal   Collection Time: 10/01/14  5:48 AM  Result Value Ref Range   WBC 9.4 4.0 - 10.5 K/uL   RBC 4.70 4.22 - 5.81 MIL/uL   Hemoglobin 14.0 13.0 -  17.0 g/dL   HCT 42.3 39.0 - 52.0 %   MCV 90.0 78.0 - 100.0 fL   MCH 29.8 26.0 - 34.0 pg   MCHC 33.1 30.0 - 36.0 g/dL   RDW 13.7 11.5 - 15.5 %   Platelets 136 (L) 150 - 400 K/uL  Protime-INR (if pt is taking Coumadin)     Status: None   Collection Time: 10/01/14  5:48 AM  Result Value Ref Range   Prothrombin Time 14.3 11.6 - 15.2 seconds   INR 1.10 0.00 - 1.49    Comprehensive metabolic panel     Status: Abnormal   Collection Time: 10/01/14  5:48 AM  Result Value Ref Range   Sodium 139 137 - 147 mEq/L   Potassium 4.2 3.7 - 5.3 mEq/L   Chloride 104 96 - 112 mEq/L   CO2 22 19 - 32 mEq/L   Glucose, Bld 169 (H) 70 - 99 mg/dL   BUN 17 6 - 23 mg/dL   Creatinine, Ser 1.06 0.50 - 1.35 mg/dL   Calcium 9.0 8.4 - 10.5 mg/dL   Total Protein 6.6 6.0 - 8.3 g/dL   Albumin 3.7 3.5 - 5.2 g/dL   AST 22 0 - 37 U/L   ALT 18 0 - 53 U/L   Alkaline Phosphatase 58 39 - 117 U/L   Total Bilirubin 0.3 0.3 - 1.2 mg/dL   GFR calc non Af Amer 67 (L) >90 mL/min   GFR calc Af Amer 78 (L) >90 mL/min    Comment: (NOTE) The eGFR has been calculated using the CKD EPI equation. This calculation has not been validated in all clinical situations. eGFR's persistently <90 mL/min signify possible Chronic Kidney Disease.    Anion gap 13 5 - 15  Lipase, blood     Status: None   Collection Time: 10/01/14  5:48 AM  Result Value Ref Range   Lipase 22 11 - 59 U/L  Troponin I     Status: None   Collection Time: 10/01/14  5:48 AM  Result Value Ref Range   Troponin I <0.30 <0.30 ng/mL    Comment:        Due to the release kinetics of cTnI, a negative result within the first hours of the onset of symptoms does not rule out myocardial infarction with certainty. If myocardial infarction is still suspected, repeat the test at appropriate intervals.   Pro b natriuretic peptide (BNP)     Status: Abnormal   Collection Time: 10/01/14  5:48 AM  Result Value Ref Range   Pro B Natriuretic peptide (BNP) 241.6 (H) 0 - 125 pg/mL  I-stat troponin, ED (not at [facility])     Status: None   Collection Time: 10/01/14  5:55 AM  Result Value Ref Range   Troponin i, poc 0.01 0.00 - 0.08 ng/mL   Comment 3            Comment: Due to the release kinetics of cTnI, a negative result within the first hours of the onset of symptoms does not rule out myocardial infarction with certainty. If myocardial  infarction is still suspected, repeat the test at appropriate intervals.    Dg Chest 2 View  10/01/2014   CLINICAL DATA:  Chest and back pain beginning this morning was shortness of breath.  EXAM: CHEST  2 VIEW  COMPARISON:  PA and lateral chest 03/10/2012.  FINDINGS: The lungs are clear. No pneumothorax or pleural effusion. Heart size is normal. Defibrillator pads are in place.  IMPRESSION: No acute disease.   Electronically Signed   By: Inge Rise M.D.   On: 10/01/2014 06:03   Ct Angio Chest Aortic Dissect W &/or W/o  10/01/2014   CLINICAL DATA:  Several hr of chest pain.  EXAM: CT ANGIOGRAPHY CHEST WITH CONTRAST  TECHNIQUE: Initially, axial CT images were obtained through the chest without intravenous contrast material. Multidetector CT imaging of the chest was performed using the standard protocol during bolus administration of intravenous contrast. Multiplanar CT image reconstructions and MIPs were obtained to evaluate the vascular anatomy.  CONTRAST:  156m OMNIPAQUE IOHEXOL 350 MG/ML SOLN  COMPARISON:  Chest radiograph October 01, 2014  FINDINGS: There is no demonstrable pulmonary embolus. There is no appreciable thoracic aortic aneurysm or dissection. Atherosclerotic change is noted in the aorta. The visualized great vessels appear widely patent.  On axial slice 28 series 5295 there is a 6 x 4 mm nodular opacity in the anterior segment of the right upper lobe. On axial slice 30 series 5747 there is a 6 x 4 mm nodular opacity in the posterior segment of the right upper lobe. On axial slice 31 series 5340 there is a 2 mm nodular opacity in the lateral segment of the right middle lobe. On axial slice 30 series 5370 there is a 2 mm nodular opacity in the medial segment of the right middle lobe. On axial slice 34 series 5964 there is a 4 mm opacity in the anterior segment of the right lower lobe which probably represents a small lymph node. There is mild bibasilar atelectatic change. There are a  few small, subcentimeter mediastinal lymph nodes, but there is no lymph nodes meeting size criteria for pathologic significance. Pericardium is not thickened. There is calcification in the left anterior descending coronary artery.  There is a small hiatal hernia.  In the visualized upper abdomen, there is a small gallstone in the gallbladder. The gallbladder wall does not appear thickened. There is hepatic steatosis. There is atherosclerotic change in the aorta.  There are no blastic or lytic bone lesions. Visualized thyroid appears normal.  Review of the MIP images confirms the above findings.  IMPRESSION: No demonstrable pulmonary embolus. No thoracic aortic aneurysm or dissection. There are scattered foci of atherosclerotic change in the aorta.  Several nodular opacities, largest measuring 6 mm. Followup of these nodular opacity should be based on Fleischner Society guidelines. If the patient is at high risk for bronchogenic carcinoma, follow-up chest CT at 6-12 months is recommended. If the patient is at low risk for bronchogenic carcinoma, follow-up chest CT at 12 months is recommended. This recommendation follows the consensus statement: Guidelines for Management of Small Pulmonary Nodules Detected on CT Scans: A Statement from the FMontroseas published in Radiology 2005;237:395-400.  No edema or consolidation.  Bibasilar atelectatic change present.  Small hiatal hernia. Hepatic steatosis. Small gallstone present. There is calcification in the left anterior descending coronary artery.  No appreciable adenopathy.   Electronically Signed   By: WLowella GripM.D.   On: 10/01/2014 07:31    VITALS: Blood pressure 140/61, pulse 77, resp. rate 19, height 5' 10"  (1.778 m), weight 210 lb (95.255 kg), SpO2 100 %.  EXAM:  General: Alert, oriented x3, no distress Head: no evidence of trauma, PERRL, EOMI, no exophtalmos or lid lag, no myxedema, no xanthelasma; normal ears, nose and oropharynx Neck:  normal jugular venous pulsations and no hepatojugular reflux; brisk carotid pulses without delay and no right carotid bruit, but a  very loud left carotid bruit Chest: clear to auscultation, no signs of consolidation by percussion or palpation, normal fremitus, symmetrical and full respiratory excursions Cardiovascular: normal position and quality of the apical impulse, regular rhythm, normal first heart sound and normal second heart sounds, no rubs or gallops, grade 1-2/6 early to mid peaking systolic crescendo decrescendo murmur, her do better at the right lower sternal border than in the aortic focus Abdomen: no tenderness or distention, no masses by palpation, no abnormal pulsatility or arterial bruits, normal bowel sounds, no hepatosplenomegaly Extremities: no clubbing, cyanosis or edema; 2+ radial, ulnar and brachial pulses bilaterally; 2+ right femoral, posterior tibial and dorsalis pedis pulses; 2+ left femoral, posterior tibial and dorsalis pedis pulses; no subclavian or femoral bruits Neurological: grossly nonfocal  ECG shows marked sinus bradycardia, first-degree AV block,subtle (less than 1 mm) ST segment elevation in the inferior leads which is seen on multiple previous tracings   IMPRESSION/Plan: Prolonged episode of chest discomfort at rest is very concerning for unstable angina pectoris, especially since it is reminiscent of his previous event. So far the electrocardiogram does not show acute changes in the initial cardiac troponin (drawn only 2-3 hours after the onset of symptoms) is negative. He has received a bolus of intravenous iodinated contrast for CT of the chest that did not show evidence of other pathology except for a small gallstone.  He has very mild residual chest discomfort at this time. We'll draw another cardiac troponin. If this is abnormal however recommend urgent coronary angiography and revascularization as indicated. If the troponin remains normal, coronary  angiography may still be the best option, but may decide to delay for 24 hours to allow for a lower same day contrast load. Alternatively, if his cardiac troponin is normal, a myocardial perfusion study may also be appropriate.  Beta blockers are relatively contraindicated due to resting bradycardia, as low as 41 bpm in the emergency room. He is already on an effective dose of statin for hyperlipidemia, with a recent LDL cholesterol of 77. He is taking aspirin.  He has a distinct systolic murmur and is suspected to have a bicuspid aortic valve. This has not been evaluated in well over 2-1/2 years when there was already evidence of mild aortic stenosis. A repeat echocardiogram is indicated. He has a very distinct left carotid bruit, which is likely due to intrinsic disease since the right carotid does not have a radiated bruit. Recommend ultrasound assessment.  Multiple tiny nodules seen on his chest CT are likely not relevant to his current problem, but may require future imaging follow-up. He has never smoked.    Sanda Klein, MD, [facility] [phone] office [phone] pager  10/01/2014, 10:31 AM

## 2014-10-01 NOTE — ED Notes (Signed)
Pt. reports mid chest and epigastric pain onset 3 am this morning with nausea and emesis ( x2) , denies SOB or diaphoresis , pt. took 2 NTG sl with slight relief , history of CAD /coronary stents his cardiologist is Dr. Melburn PopperNasher.

## 2014-10-01 NOTE — Plan of Care (Signed)
Problem: Phase I Progression Outcomes Goal: Hemodynamically stable Outcome: Completed/Met Date Met:  10/01/14 Goal: Anginal pain relieved Outcome: Completed/Met Date Met:  10/01/14 Goal: Aspirin unless contraindicated Outcome: Completed/Met Date Met:  10/01/14 Goal: MD aware of Cardiac Marker results Outcome: Completed/Met Date Met:  10/01/14 Goal: Voiding-avoid urinary catheter unless indicated Outcome: Completed/Met Date Met:  10/01/14

## 2014-10-01 NOTE — ED Provider Notes (Signed)
CSN: [id number]     Arrival date & time 10/01/14  [id number] History   First MD Initiated Contact with Patient 10/01/14 0515     Chief Complaint  Patient presents with  . Chest Pain     (Consider location/radiation/quality/duration/timing/severity/associated sxs/prior Treatment) HPI Comments: Patient presents to ER for evaluation of chest pain. Patient reports that he was awakened from sleep at 3 AM with substernal chest pain. He reports that the pain radiates into his back. He does not have shortness of breath or diaphoresis. He did have associated nausea and vomiting with pain. He took 2 sublingual nitroglycerin with some relief, pain is still present, however. Patient does report a previous history of heart disease, having had stenting in 2006. He reports he does not typically have chest pain.  Patient is a 73 y.o. male presenting with chest pain.  Chest Pain Associated symptoms: nausea     Past Medical History  Diagnosis Date  . Hyperlipidemia   . Coronary artery disease     CAD s/p PTCA/Taxus stent to mid-prox LAD 2006  . Bradycardia   . Bicuspid aortic valve    Past Surgical History  Procedure Laterality Date  . Cardiac catheterization  07/28/2005    Est EF of 55% -- Single-vessel obstructive disease in the proximal/mid left anterior descending -- Preserved left ventricular systolic function with good anterior wall motion  . Coronary angioplasty with stent placement  07/28/2005    Successful PTCA and stenting of the mid and proximal left anterior descending artery.  The patient is stable leaving the catheterization laboratory -- Vesta MixerPhilip J. Nahser, M.D.    . Cataracts     Family History  Problem Relation Age of Onset  . Cancer    . Heart disease Neg Hx    History  Substance Use Topics  . Smoking status: Never Smoker   . Smokeless tobacco: Never Used  . Alcohol Use: No    Review of Systems  Cardiovascular: Positive for chest pain.  Gastrointestinal: Positive for nausea.   All other systems reviewed and are negative.     Allergies  Review of patient's allergies indicates no known allergies.  Home Medications   Prior to Admission medications   Medication Sig Start Date End Date Taking? Authorizing Provider  aspirin EC 81 MG tablet Take 81 mg by mouth daily.    Historical Provider, MD  atorvastatin (LIPITOR) 40 MG tablet Take 0.5 tablets (20 mg total) by mouth daily. 07/31/14   Vesta MixerPhilip J Nahser, MD  dorzolamide-timolol (COSOPT) 22.3-6.8 MG/ML ophthalmic solution Apply 1 drop to eye 2 (two) times daily.     Historical Provider, MD  latanoprost (XALATAN) 0.005 % ophthalmic solution Place 1 drop into both eyes at bedtime.     Historical Provider, MD  lisinopril (PRINIVIL,ZESTRIL) 10 MG tablet Take 1 tablet (10 mg total) by mouth daily. 07/31/14   Vesta MixerPhilip J Nahser, MD  Multiple Vitamin (MULITIVITAMIN WITH MINERALS) TABS Take 1 tablet by mouth daily.    Historical Provider, MD  Multiple Vitamins-Minerals (ICAPS AREDS FORMULA PO) Take by mouth daily.    Historical Provider, MD  nitroGLYCERIN (NITROSTAT) 0.4 MG SL tablet Place 1 tablet (0.4 mg total) under the tongue every 5 (five) minutes as needed. For chest pain. 01/15/13   Vesta MixerPhilip J Nahser, MD  omega-3 acid ethyl esters (LOVAZA) 1 G capsule Take 2 capsules (2 g total) by mouth daily. 12/29/13   Vesta MixerPhilip J Nahser, MD   BP 135/57 mmHg  Pulse 42  Resp 15  Ht 5\' 10"  (1.778 m)  Wt 210 lb (95.255 kg)  BMI 30.13 kg/m2  SpO2 100% Physical Exam  Constitutional: He is oriented to person, place, and time. He appears well-developed and well-nourished. No distress.  HENT:  Head: Normocephalic and atraumatic.  Right Ear: Hearing normal.  Left Ear: Hearing normal.  Nose: Nose normal.  Mouth/Throat: Oropharynx is clear and moist and mucous membranes are normal.  Eyes: Conjunctivae and EOM are normal. Pupils are equal, round, and reactive to light.  Neck: Normal range of motion. Neck supple.  Cardiovascular: Regular rhythm,  S1 normal and S2 normal.  Exam reveals no gallop and no friction rub.   No murmur heard. Pulmonary/Chest: Effort normal and breath sounds normal. No respiratory distress. He exhibits no tenderness.  Abdominal: Soft. Normal appearance and bowel sounds are normal. There is no hepatosplenomegaly. There is no tenderness. There is no rebound, no guarding, no tenderness at McBurney's point and negative Murphy's sign. No hernia.  Musculoskeletal: Normal range of motion.  Neurological: He is alert and oriented to person, place, and time. He has normal strength. No cranial nerve deficit or sensory deficit. Coordination normal. GCS eye subscore is 4. GCS verbal subscore is 5. GCS motor subscore is 6.  Skin: Skin is warm, dry and intact. No rash noted. No cyanosis.  Psychiatric: He has a normal mood and affect. His speech is normal and behavior is normal. Thought content normal.  Nursing note and vitals reviewed.   ED Course  Procedures (including critical care time) Labs Review Labs Reviewed  CBC  PROTIME-INR  COMPREHENSIVE METABOLIC PANEL  LIPASE, BLOOD  TROPONIN I  PRO B NATRIURETIC PEPTIDE  I-STAT TROPOININ, ED    Imaging Review No results found.   EKG Interpretation   Date/Time:  Thursday October 01 2014 05:19:10 EST Ventricular Rate:  39 PR Interval:  190 QRS Duration: 106 QT Interval:  476 QTC Calculation: 383 R Axis:   76 Text Interpretation:  Sinus bradycardia Minimal ST elevation, inferior  leads (SEEN ON PRIOR ECG) No significant change since last tracing other  than bradycardia Reconfirmed by POLLINA  MD, CHRISTOPHER [phone]([id number]) on  10/01/2014 5:25:50 AM      MDM   Final diagnoses:  Chest pain    Patient presents to the ER for evaluation of constant pain in the center of his chest radiating through into his back. Pain has not moved at all since it began 3 hours before coming to the ER. Patient does have a history of coronary artery disease, status post stenting of  single vessel disease in 2006. He reports some improvement of his pain after he took nitroglycerin at home, was still expressing pain upon arrival to the ER.  At arrival to the ER patient was significantly bradycardic down into the high 30s. Rhythm was identified as sinus bradycardia. Bradycardia resolved after treatment with atropine. No evidence of second or third degree block.  Patient did not have any evidence of ischemia or infarct on his EKG. Initial troponin was negative. Cardiology consult to evaluate the patient for further management including possible admission.     Gilda Creasehristopher J. Pollina, MD 10/01/14 [phone]

## 2014-10-01 NOTE — ED Notes (Signed)
Pt and family updated that cardiology on the way.

## 2014-10-02 ENCOUNTER — Encounter (HOSPITAL_COMMUNITY)
Admission: EM | Disposition: A | Discharge status: Home / Self Care | Payer: Self-pay | Source: Home / Self Care | Attending: Cardiovascular Disease

## 2014-10-02 DIAGNOSIS — R001 Bradycardia, unspecified: Secondary | ICD-10-CM

## 2014-10-02 DIAGNOSIS — I2510 Atherosclerotic heart disease of native coronary artery without angina pectoris: Secondary | ICD-10-CM

## 2014-10-02 DIAGNOSIS — I10 Essential (primary) hypertension: Secondary | ICD-10-CM

## 2014-10-02 HISTORY — PX: LEFT HEART CATHETERIZATION WITH CORONARY ANGIOGRAM: SHX5451

## 2014-10-02 LAB — CK TOTAL AND CKMB (NOT AT ARMC)
CK TOTAL: 50 U/L (ref 7–232)
CK, MB: <1 ng/mL (ref 0.3–4.0)

## 2014-10-02 LAB — COMPREHENSIVE METABOLIC PANEL
ALBUMIN: 3.1 g/dL — AB (ref 3.5–5.2)
ALT: 42 U/L (ref 0–53)
AST: 33 U/L (ref 0–37)
Alkaline Phosphatase: 69 U/L (ref 39–117)
Anion gap: 12 (ref 5–15)
BUN: 20 mg/dL (ref 6–23)
CO2: 23 mEq/L (ref 19–32)
CREATININE: 1.24 mg/dL (ref 0.50–1.35)
Calcium: 8.5 mg/dL (ref 8.4–10.5)
Chloride: 102 mEq/L (ref 96–112)
GFR calc Af Amer: 64 mL/min — ABNORMAL LOW (ref >90)
GFR calc non Af Amer: 56 mL/min — ABNORMAL LOW (ref >90)
Glucose, Bld: 116 mg/dL — ABNORMAL HIGH (ref 70–99)
Potassium: 3.7 mEq/L (ref 3.7–5.3)
Sodium: 137 mEq/L (ref 137–147)
TOTAL PROTEIN: 6.1 g/dL (ref 6.0–8.3)
Total Bilirubin: 0.8 mg/dL (ref 0.3–1.2)

## 2014-10-02 SURGERY — LEFT HEART CATHETERIZATION WITH CORONARY ANGIOGRAM
Anesthesia: LOCAL

## 2014-10-02 MED ORDER — SODIUM CHLORIDE 0.9 % IV SOLN: 250.0000 mL | INTRAVENOUS | Status: DC | PRN

## 2014-10-02 MED ORDER — HEPARIN SODIUM (PORCINE) 1000 UNIT/ML IJ SOLN
INTRAMUSCULAR | Status: AC
  Filled 2014-10-02: qty 1

## 2014-10-02 MED ORDER — NITROGLYCERIN 1 MG/10 ML FOR IR/CATH LAB
INTRA_ARTERIAL | Status: AC
  Filled 2014-10-02: qty 10

## 2014-10-02 MED ORDER — VERAPAMIL HCL 2.5 MG/ML IV SOLN
INTRAVENOUS | Status: AC
  Filled 2014-10-02: qty 2

## 2014-10-02 MED ORDER — HEPARIN (PORCINE) IN NACL 2-0.9 UNIT/ML-% IJ SOLN
INTRAMUSCULAR | Status: AC
  Filled 2014-10-02: qty 1000

## 2014-10-02 MED ORDER — METOPROLOL TARTRATE 25 MG PO TABS
25.0000 mg | ORAL_TABLET | Freq: Two times a day (BID) | ORAL | Status: DC
  Administered 2014-10-02 (×2): 25 mg via ORAL
  Filled 2014-10-02 (×3): qty 1

## 2014-10-02 MED ORDER — ASPIRIN 81 MG PO CHEW
81.0000 mg | CHEWABLE_TABLET | ORAL | Status: AC
  Administered 2014-10-02: 81 mg via ORAL
  Filled 2014-10-02: qty 1

## 2014-10-02 MED ORDER — ATORVASTATIN CALCIUM 80 MG PO TABS
80.0000 mg | ORAL_TABLET | Freq: Every day | ORAL | Status: DC
  Administered 2014-10-02: 80 mg via ORAL
  Filled 2014-10-02: qty 1

## 2014-10-02 MED ORDER — LIDOCAINE HCL (PF) 1 % IJ SOLN
INTRAMUSCULAR | Status: AC
  Filled 2014-10-02: qty 30

## 2014-10-02 MED ORDER — MIDAZOLAM HCL 2 MG/2ML IJ SOLN
INTRAMUSCULAR | Status: AC
  Filled 2014-10-02: qty 2

## 2014-10-02 MED ORDER — ADENOSINE 12 MG/4ML IV SOLN
16.0000 mL | Freq: Once | INTRAVENOUS | Status: AC
  Filled 2014-10-02: qty 16

## 2014-10-02 MED ORDER — ACETAMINOPHEN 325 MG PO TABS: 650.0000 mg | ORAL_TABLET | ORAL | Status: DC | PRN

## 2014-10-02 MED ORDER — SODIUM CHLORIDE 0.9 % IJ SOLN
3.0000 mL | Freq: Two times a day (BID) | INTRAMUSCULAR | Status: DC
  Administered 2014-10-02: 3 mL via INTRAVENOUS

## 2014-10-02 MED ORDER — FENTANYL CITRATE 0.05 MG/ML IJ SOLN
INTRAMUSCULAR | Status: AC
  Filled 2014-10-02: qty 2

## 2014-10-02 MED ORDER — ONDANSETRON HCL 4 MG/2ML IJ SOLN: 4.0000 mg | Freq: Four times a day (QID) | INTRAMUSCULAR | Status: DC | PRN

## 2014-10-02 MED ORDER — SODIUM CHLORIDE 0.9 % IJ SOLN: 3.0000 mL | INTRAMUSCULAR | Status: DC | PRN

## 2014-10-02 MED ORDER — ISOSORBIDE MONONITRATE ER 30 MG PO TB24
30.0000 mg | ORAL_TABLET | Freq: Every day | ORAL | Status: DC
  Administered 2014-10-02 – 2014-10-03 (×2): 30 mg via ORAL
  Filled 2014-10-02 (×2): qty 1

## 2014-10-02 MED ORDER — SODIUM CHLORIDE 0.9 % IV SOLN
INTRAVENOUS | Status: DC
  Administered 2014-10-02: 150 mL via INTRAVENOUS

## 2014-10-02 NOTE — Progress Notes (Signed)
TR Band removed at 1800 without complications.  No bleeding noted, radial pulse +2.  No c/o pain or discomfort. VS WNL.

## 2014-10-02 NOTE — H&P (View-Only) (Signed)
Patient ID: Anthony Carpenter, male   DOB: 10/26/1941, 73 y.o.   MRN: [id number]    Subjective:  Denies SSCP, palpitations or Dyspnea   Objective:  Filed Vitals:   10/01/14 1150 10/01/14 2025 10/01/14 2340 10/02/14 0412  BP: 134/55 107/41 105/41 106/47  Pulse: 76 61 58 61  Temp: 98.4 F (36.9 C) 97.8 F (36.6 C) 98.2 F (36.8 C) 98.2 F (36.8 C)  TempSrc: Oral Oral Oral Oral  Resp: 20 18 18 18  Height: 5' 10" (1.778 m)     Weight: 96.616 kg (213 lb)   96.163 kg (212 lb)  SpO2: 98% 98% 98% 96%    Intake/Output from previous day: No intake or output data in the 24 hours ending 10/02/14 0717  Physical Exam: Affect appropriate Healthy:  appears stated age HEENT: normal Neck supple with no adenopathy JVP normal no bruits no thyromegaly Lungs clear with no wheezing and good diaphragmatic motion Heart:  S1/S2 no murmur, no rub, gallop or click PMI normal Abdomen: benighn, BS positve, no tenderness, no AAA no bruit.  No HSM or HJR Distal pulses intact with no bruits No edema Neuro non-focal Skin warm and dry No muscular weakness   Lab Results: Basic Metabolic Panel:  Recent Labs  10/01/14 0548 10/02/14 0536  NA 139 137  K 4.2 3.7  CL 104 102  CO2 22 23  GLUCOSE 169* 116*  BUN 17 20  CREATININE 1.06 1.24  CALCIUM 9.0 8.5   Liver Function Tests:  Recent Labs  10/01/14 0548 10/02/14 0536  AST 22 33  ALT 18 42  ALKPHOS 58 69  BILITOT 0.3 0.8  PROT 6.6 6.1  ALBUMIN 3.7 3.1*    Recent Labs  10/01/14 0548  LIPASE 22   CBC:  Recent Labs  10/01/14 0548  WBC 9.4  HGB 14.0  HCT 42.3  MCV 90.0  PLT 136*   Cardiac Enzymes:  Recent Labs  10/01/14 0548 10/01/14 1034  TROPONINI <0.30 <0.30    Imaging: Dg Chest 2 View  10/01/2014   CLINICAL DATA:  Chest and back pain beginning this morning was shortness of breath.  EXAM: CHEST  2 VIEW  COMPARISON:  PA and lateral chest 03/10/2012.  FINDINGS: The lungs are clear. No pneumothorax or pleural  effusion. Heart size is normal. Defibrillator pads are in place.  IMPRESSION: No acute disease.   Electronically Signed   By: Thomas  Dalessio M.D.   On: 10/01/2014 06:03   Ct Angio Chest Aortic Dissect W &/or W/o  10/01/2014   CLINICAL DATA:  Several hr of chest pain.  EXAM: CT ANGIOGRAPHY CHEST WITH CONTRAST  TECHNIQUE: Initially, axial CT images were obtained through the chest without intravenous contrast material. Multidetector CT imaging of the chest was performed using the standard protocol during bolus administration of intravenous contrast. Multiplanar CT image reconstructions and MIPs were obtained to evaluate the vascular anatomy.  CONTRAST:  100mL OMNIPAQUE IOHEXOL 350 MG/ML SOLN  COMPARISON:  Chest radiograph October 01, 2014  FINDINGS: There is no demonstrable pulmonary embolus. There is no appreciable thoracic aortic aneurysm or dissection. Atherosclerotic change is noted in the aorta. The visualized great vessels appear widely patent.  On axial slice 28 series 506, there is a 6 x 4 mm nodular opacity in the anterior segment of the right upper lobe. On axial slice 30 series 506, there is a 6 x 4 mm nodular opacity in the posterior segment of the right upper lobe. On axial slice 31 series   506, there is a 2 mm nodular opacity in the lateral segment of the right middle lobe. On axial slice 30 series 506, there is a 2 mm nodular opacity in the medial segment of the right middle lobe. On axial slice 34 series 506, there is a 4 mm opacity in the anterior segment of the right lower lobe which probably represents a small lymph node. There is mild bibasilar atelectatic change. There are a few small, subcentimeter mediastinal lymph nodes, but there is no lymph nodes meeting size criteria for pathologic significance. Pericardium is not thickened. There is calcification in the left anterior descending coronary artery.  There is a small hiatal hernia.  In the visualized upper abdomen, there is a small  gallstone in the gallbladder. The gallbladder wall does not appear thickened. There is hepatic steatosis. There is atherosclerotic change in the aorta.  There are no blastic or lytic bone lesions. Visualized thyroid appears normal.  Review of the MIP images confirms the above findings.  IMPRESSION: No demonstrable pulmonary embolus. No thoracic aortic aneurysm or dissection. There are scattered foci of atherosclerotic change in the aorta.  Several nodular opacities, largest measuring 6 mm. Followup of these nodular opacity should be based on Fleischner Society guidelines. If the patient is at high risk for bronchogenic carcinoma, follow-up chest CT at 6-12 months is recommended. If the patient is at low risk for bronchogenic carcinoma, follow-up chest CT at 12 months is recommended. This recommendation follows the consensus statement: Guidelines for Management of Small Pulmonary Nodules Detected on CT Scans: A Statement from the Fleischner Society as published in Radiology 2005;237:395-400.  No edema or consolidation.  Bibasilar atelectatic change present.  Small hiatal hernia. Hepatic steatosis. Small gallstone present. There is calcification in the left anterior descending coronary artery.  No appreciable adenopathy.   Electronically Signed   By: Bretta BangWilliam  Woodruff M.D.   On: 10/01/2014 07:31    Cardiac Studies:  ECG:  SR J point elevation in inferior leads    Telemetry:  NSR no arrhythmia   Echo:   Medications:   . aspirin EC  81 mg Oral Daily  . atorvastatin  20 mg Oral Daily  . dorzolamide-timolol  1 drop Both Eyes BID  . heparin  5,000 Units Subcutaneous 3 times per day  . latanoprost  1 drop Both Eyes QHS  . lisinopril  10 mg Oral Daily  . nitroGLYCERIN  1 inch Topical 4 times per day  . omega-3 acid ethyl esters  2 g Oral Daily     . sodium chloride 50 mL/hr (10/01/14 1412)    Assessment/Plan:   Chol:  On statin target LDL under 70 with known CAD HTN:  Well controlled continue  ACE Bradycardia:  Stable no AV block avoid beta blockers despite CAD   CAD:  Discussed with patient he is leary about myovue  Thinks pain was similar to pre stent pain 6 years ago. Also does not want to be here over weekend if myovue positive.  Agree that pre test likelyhood of recurrent hemodynamically significant CAD is high  Will arrange cath for today  Has scar on right forearm but from superficial skin trauma  Good radial pulse  Lab called orders written Charlton HawsPeter Sheletha Bow 10/02/2014, 7:17 AM

## 2014-10-02 NOTE — Interval H&P Note (Signed)
Cath Lab Visit (complete for each Cath Lab visit)  Clinical Evaluation Leading to the Procedure:   ACS: No.  Non-ACS:    Anginal Classification: CCS IV  Anti-ischemic medical therapy: Maximal Therapy (2 or more classes of medications)  Non-Invasive Test Results: No non-invasive testing performed  Prior CABG: No previous CABG      History and Physical Interval Note:  10/02/2014 12:33 PM  Anthony Carpenter  has presented today for surgery, with the diagnosis of CP  The various methods of treatment have been discussed with the patient and family. After consideration of risks, benefits and other options for treatment, the patient has consented to  Procedure(s): LEFT HEART CATHETERIZATION WITH CORONARY ANGIOGRAM (N/A) as a surgical intervention .  The patient's history has been reviewed, patient examined, no change in status, stable for surgery.  I have reviewed the patient's chart and labs.  Questions were answered to the patient's satisfaction.     KELLY,THOMAS A

## 2014-10-02 NOTE — CV Procedure (Signed)
Anthony JonesJohn R Ginley is a 73 y.o. male   [id number]  [id number] LOCATION:  FACILITY: [facility]  PHYSICIAN: Lennette Biharihomas A. Kelly, MD, [facility] 12/03/1940   DATE OF PROCEDURE:  10/02/2014     CARDIAC CATHETERIZATION    HISTORY:   Anthony Carpenter is a 73 year old gentleman who is status post DES stenting to his mid LAD in 2006.  He was awakened from sleep yesterday with chest discomfort followed by emesis.  Remote nuclear stress test in 2013 showed a small fixed distal anteroseptal defect.  The patient is now referred for catheterization   PROCEDURE:  Left heart catheterization via the right radial artery approach: Coronary angiography, left ventriculography, fractional flow reserve to the LAD to evaluate the physiologic severity of a 60% in-stent stenosis.  The patient was brought to the second floor [place] [facility] cath lab in the fasting state. The patient was premedicated with Versed 2 mg and fentanyl 50 mcg. A right radial approach was utilized after an Allen's test verified adequate circulation. The right radial artery was punctured via the Seldinger technique, and a 6 [place] Glidesheath Slender was inserted without difficulty.  A radial cocktail consisting of Verapamil, IV nitroglycerin, and lidocaine was administered. Weight adjusted heparin was administered. A safety J wire was advanced into the ascending aorta. Diagnostic catheterization was done with 5 [place] TIG 4.0 catheter. A 5 French pigtail catheter was used for left ventriculography. With the demonstration of a 60 to possible 70% stenosis within the proximal portion of the previously placed LAD stent.  The decision was made to utilize fractional flow reserve to assess the physiologic significance.  The patient received an additional 2000 units of heparin after his initial ACT was 214 (he had received 5000 units at the start of the radial catheterization procedure).  A [place]cano FFR system was used utilized for the FFR.    Base line  measurements were made,  adenosine was then infused after the system was passed beyond the stenosis.  An additional measurements were recorded.  With the demonstration of an FFR of 0.85, this was not felt to be physiologically significant to warrant PCI.  All catheters were removed from the patient.  A TR band was applied for hemostasis. The patient left the catheterization laboratory in stable condition.   HEMODYNAMICS:   Central Aorta: 100/50  Left Ventricle: 112/21  ANGIOGRAPHY:   The left main coronary artery was angiographically normal and bifurcated into the LAD and left circumflex coronary artery.   The LAD was a moderate size vessel.  There was an very proximal first diagonal branch.  Beyond this, it was a long segment of stenting which encompassed the takeoff of the second diagonal vessel.  There was 60% in-stent restenosis proximal to the diagonal takeoff within the stent.  There was mild 30% narrowing at the ostium of the diagonal branch.  The remainder of the LAD was free of significant disease.  The left circumflex coronary artery was Moderate size vessel that had 30% smooth narrowing after the takeoff of an atrial circumflex branch.   The RCA was a large caliber dominant vessel that had mild luminal irregularities in the proximal and mid segment.  The vessel supplied a large PDA and PLA vessel.   Left ventriculography revealed normal global LV contractility without focal segmental wall motion abnormalities. There was no evidence for mitral regurgitation. Ejection fraction is 55%   Fractional flow reserve in the mid LAD beyond the previously placed stent following 2 minutes of adenosine infusion:  0.85  IMPRESSION:  Coronary obstructive disease with initially placed proximal to mid LAD stent with 60% smooth focal in-stent restenosis proximal to the diagonal takeoff; 30% mid AV groove stenosis; and luminal irregularities of a large dominant RCA.  Normal LV function without focal segmental wall motion  abnormalities and an ejection fraction of 55%.  Fractional flow reserve of the LAD at 0.85, not felt to be physiologically significant to warrant repeat percutaneous coronary intervention.   RECOMMENDATION:  Increased medical therapy.  Of note, the patient was awakened from sleep with chest pain.  Consideration for evaluation of obstructive sleep apnea may be warranted.   Lennette Biharihomas A. Kelly, MD, [facility] 10/02/2014 5:59 PM

## 2014-10-02 NOTE — Progress Notes (Addendum)
Patient ID: Anthony JonesJohn R Carpenter, male   DOB: 03/14/1941, 73 y.o.   MRN: [id number]    Subjective:  Denies SSCP, palpitations or Dyspnea   Objective:  Filed Vitals:   10/01/14 1150 10/01/14 2025 10/01/14 2340 10/02/14 0412  BP: 134/55 107/41 105/41 106/47  Pulse: 76 61 58 61  Temp: 98.4 F (36.9 C) 97.8 F (36.6 C) 98.2 F (36.8 C) 98.2 F (36.8 C)  TempSrc: Oral Oral Oral Oral  Resp: 20 18 18 18   Height: 5\' 10"  (1.778 m)     Weight: 96.616 kg (213 lb)   96.163 kg (212 lb)  SpO2: 98% 98% 98% 96%    Intake/Output from previous day: No intake or output data in the 24 hours ending 10/02/14 0717  Physical Exam: Affect appropriate Healthy:  appears stated age HEENT: normal Neck supple with no adenopathy JVP normal no bruits no thyromegaly Lungs clear with no wheezing and good diaphragmatic motion Heart:  S1/S2 no murmur, no rub, gallop or click PMI normal Abdomen: benighn, BS positve, no tenderness, no AAA no bruit.  No HSM or HJR Distal pulses intact with no bruits No edema Neuro non-focal Skin warm and dry No muscular weakness   Lab Results: Basic Metabolic Panel:  Recent Labs  40/98/1110/11/03 0548 10/02/14 0536  NA 139 137  K 4.2 3.7  CL 104 102  CO2 22 23  GLUCOSE 169* 116*  BUN 17 20  CREATININE 1.06 1.24  CALCIUM 9.0 8.5   Liver Function Tests:  Recent Labs  10/01/14 0548 10/02/14 0536  AST 22 33  ALT 18 42  ALKPHOS 58 69  BILITOT 0.3 0.8  PROT 6.6 6.1  ALBUMIN 3.7 3.1*    Recent Labs  10/01/14 0548  LIPASE 22   CBC:  Recent Labs  10/01/14 0548  WBC 9.4  HGB 14.0  HCT 42.3  MCV 90.0  PLT 136*   Cardiac Enzymes:  Recent Labs  10/01/14 0548 10/01/14 1034  TROPONINI <0.30 <0.30    Imaging: Dg Chest 2 View  10/01/2014   CLINICAL DATA:  Chest and back pain beginning this morning was shortness of breath.  EXAM: CHEST  2 VIEW  COMPARISON:  PA and lateral chest 03/10/2012.  FINDINGS: The lungs are clear. No pneumothorax or pleural  effusion. Heart size is normal. Defibrillator pads are in place.  IMPRESSION: No acute disease.   Electronically Signed   By: Drusilla Kannerhomas  Dalessio M.D.   On: 10/01/2014 06:03   Ct Angio Chest Aortic Dissect W &/or W/o  10/01/2014   CLINICAL DATA:  Several hr of chest pain.  EXAM: CT ANGIOGRAPHY CHEST WITH CONTRAST  TECHNIQUE: Initially, axial CT images were obtained through the chest without intravenous contrast material. Multidetector CT imaging of the chest was performed using the standard protocol during bolus administration of intravenous contrast. Multiplanar CT image reconstructions and MIPs were obtained to evaluate the vascular anatomy.  CONTRAST:  100mL OMNIPAQUE IOHEXOL 350 MG/ML SOLN  COMPARISON:  Chest radiograph October 01, 2014  FINDINGS: There is no demonstrable pulmonary embolus. There is no appreciable thoracic aortic aneurysm or dissection. Atherosclerotic change is noted in the aorta. The visualized great vessels appear widely patent.  On axial slice 28 series 506, there is a 6 x 4 mm nodular opacity in the anterior segment of the right upper lobe. On axial slice 30 series 506, there is a 6 x 4 mm nodular opacity in the posterior segment of the right upper lobe. On axial slice 31 series  506, there is a 2 mm nodular opacity in the lateral segment of the right middle lobe. On axial slice 30 series 506, there is a 2 mm nodular opacity in the medial segment of the right middle lobe. On axial slice 34 series 506, there is a 4 mm opacity in the anterior segment of the right lower lobe which probably represents a small lymph node. There is mild bibasilar atelectatic change. There are a few small, subcentimeter mediastinal lymph nodes, but there is no lymph nodes meeting size criteria for pathologic significance. Pericardium is not thickened. There is calcification in the left anterior descending coronary artery.  There is a small hiatal hernia.  In the visualized upper abdomen, there is a small  gallstone in the gallbladder. The gallbladder wall does not appear thickened. There is hepatic steatosis. There is atherosclerotic change in the aorta.  There are no blastic or lytic bone lesions. Visualized thyroid appears normal.  Review of the MIP images confirms the above findings.  IMPRESSION: No demonstrable pulmonary embolus. No thoracic aortic aneurysm or dissection. There are scattered foci of atherosclerotic change in the aorta.  Several nodular opacities, largest measuring 6 mm. Followup of these nodular opacity should be based on Fleischner Society guidelines. If the patient is at high risk for bronchogenic carcinoma, follow-up chest CT at 6-12 months is recommended. If the patient is at low risk for bronchogenic carcinoma, follow-up chest CT at 12 months is recommended. This recommendation follows the consensus statement: Guidelines for Management of Small Pulmonary Nodules Detected on CT Scans: A Statement from the Fleischner Society as published in Radiology 2005;237:395-400.  No edema or consolidation.  Bibasilar atelectatic change present.  Small hiatal hernia. Hepatic steatosis. Small gallstone present. There is calcification in the left anterior descending coronary artery.  No appreciable adenopathy.   Electronically Signed   By: Bretta BangWilliam  Woodruff M.D.   On: 10/01/2014 07:31    Cardiac Studies:  ECG:  SR J point elevation in inferior leads    Telemetry:  NSR no arrhythmia   Echo:   Medications:   . aspirin EC  81 mg Oral Daily  . atorvastatin  20 mg Oral Daily  . dorzolamide-timolol  1 drop Both Eyes BID  . heparin  5,000 Units Subcutaneous 3 times per day  . latanoprost  1 drop Both Eyes QHS  . lisinopril  10 mg Oral Daily  . nitroGLYCERIN  1 inch Topical 4 times per day  . omega-3 acid ethyl esters  2 g Oral Daily     . sodium chloride 50 mL/hr (10/01/14 1412)    Assessment/Plan:   Chol:  On statin target LDL under 70 with known CAD HTN:  Well controlled continue  ACE Bradycardia:  Stable no AV block avoid beta blockers despite CAD   CAD:  Discussed with patient he is leary about myovue  Thinks pain was similar to pre stent pain 6 years ago. Also does not want to be here over weekend if myovue positive.  Agree that pre test likelyhood of recurrent hemodynamically significant CAD is high  Will arrange cath for today  Has scar on right forearm but from superficial skin trauma  Good radial pulse  Lab called orders written Charlton HawsPeter Nishan 10/02/2014, 7:17 AM

## 2014-10-03 ENCOUNTER — Encounter (HOSPITAL_COMMUNITY): Payer: Self-pay | Admitting: Physician Assistant

## 2014-10-03 DIAGNOSIS — I350 Nonrheumatic aortic (valve) stenosis: Secondary | ICD-10-CM

## 2014-10-03 DIAGNOSIS — R911 Solitary pulmonary nodule: Secondary | ICD-10-CM | POA: Diagnosis present

## 2014-10-03 DIAGNOSIS — I25119 Atherosclerotic heart disease of native coronary artery with unspecified angina pectoris: Secondary | ICD-10-CM | POA: Diagnosis present

## 2014-10-03 DIAGNOSIS — R079 Chest pain, unspecified: Secondary | ICD-10-CM

## 2014-10-03 DIAGNOSIS — R0989 Other specified symptoms and signs involving the circulatory and respiratory systems: Secondary | ICD-10-CM | POA: Diagnosis present

## 2014-10-03 DIAGNOSIS — I2510 Atherosclerotic heart disease of native coronary artery without angina pectoris: Secondary | ICD-10-CM | POA: Diagnosis present

## 2014-10-03 MED ORDER — ISOSORBIDE MONONITRATE ER 30 MG PO TB24: 30.0000 mg | ORAL_TABLET | Freq: Every day | ORAL | Status: DC

## 2014-10-03 MED ORDER — SODIUM CHLORIDE 0.9 % IV BOLUS (SEPSIS)
500.0000 mL | Freq: Once | INTRAVENOUS | Status: AC
  Administered 2014-10-03: 500 mL via INTRAVENOUS

## 2014-10-03 MED ORDER — ATORVASTATIN CALCIUM 80 MG PO TABS: 80.0000 mg | ORAL_TABLET | Freq: Every day | ORAL | Status: DC

## 2014-10-03 NOTE — Progress Notes (Signed)
MD paged - Heart rate 58, last 3 BP readings 102/42, 101/39, 86/26. 12AM nitro held

## 2014-10-03 NOTE — Discharge Summary (Signed)
Discharge Summary   Patient ID: Anthony JonesJohn R Kamaka MRN: [id number], DOB/AGE: 74/12/1940 73 y.o. Admit date: 10/01/2014 D/C date:     10/03/2014  Primary Cardiologist: Dr. Elease HashimotoNahser   Principal Problem:   Unstable angina pectoris Active Problems:   Dyslipidemia   HTN (hypertension)   Bradycardia   Aortic stenosis, mild   Coronary artery disease   Carotid bruit present   Pulmonary nodule seen on imaging study    Admission Dates: 10/01/14- 10/03/14 Discharge Diagnosis: Chest pain s/p LHC with non-obs CAD and continued medical management  HPI: Anthony Carpenter is a 73 y.o. male with a history of CAD s/p DES to mLAD (2006), HLD, bradycardia and possibly bicuspid aortic valve who [facility]14 with chest pain concerning for [place].   He was woken from sleep around 2:30 am on the morning of admission with chest discomfort, followed by 2 episodes of emesis. 3 sublingual nitroglycerin provided only partial relief. Additional relief appeared to occur after topical nitrates were placed in the emergency room. His initial cardiac troponin was normal. His electrocardiogram showed chronic subtle ST segment elevation in the inferior leads but no acute ST segment changes.  Hospital Course  CAD/USA:cath 10/02/14 with 60% focal instent restenosis in LAD prox to the Diag takeoff, 30% mid AV groove left circ stenosis and luminal irregularities in a large dominant RCA. FFR of LAD was 0.85 and not felt to be hemodynamically significant to warrant PCI. -- Continue medical management. Continue ASA, statin. Imdur 30mg  added to his medical regimen.  -- 2D ECHO with normal LV function: EF 55-60%  HLD:On statin target LDL under 70 with known CAD -- Continue atorvastatin 80mg  and fish oil  HTN: Well controlled. -- Continue Lisinopril 10mg    Bradycardia: Stable. Avoid beta blockers despite CAD  Aortic stenosis- mild by 2D ECHO 10/01/14:  -- Valve area (VTI): 1.89 cm^2. Valve area (Vmax): 1.84  cm^2. Valve area (Vmean): 1.88 cm^2.   R carotid bruit- dopplers ordered in hospital but i do not see any results. This may show up later or need to be repeated as an outpatient  Multiple tiny nodules seen on his chest CT - "Several nodular opacities, largest measuring 6 mm. Followup of these nodular opacity should be based on Fleischner Society guidelines. If the patient is at high risk for bronchogenic carcinoma, follow-up chest CT at 6-12 months is recommended. If the patient is at low risk for bronchogenic carcinoma, follow-up chest CT at 12 months is recommended." These may require future imaging follow-up. He has never smoked. -- Follow up with PCP  The patient has had an uncomplicated hospital course and is recovering well. The radial catheter site is stable. He has been seen by Dr. Mayford Knifeurner today and deemed ready for discharge home. A staff message to arrange follow up in 1-2 weeks has been sent. Discharge medications are listed below.   Discharge Vitals: Blood pressure 106/49, pulse 52, temperature 99.2 F (37.3 C), temperature source Oral, resp. rate 18, height 5\' 10"  (1.778 m), weight 220 lb 3.2 oz (99.882 kg), SpO2 96 %.  Labs: Lab Results  Component Value Date   WBC 9.4 10/01/2014   HGB 14.0 10/01/2014   HCT 42.3 10/01/2014   MCV 90.0 10/01/2014   PLT 136* 10/01/2014     Recent Labs Lab 10/02/14 0536  NA 137  K 3.7  CL 102  CO2 23  BUN 20  CREATININE 1.24  CALCIUM 8.5  PROT 6.1  BILITOT 0.8  ALKPHOS 69  ALT 42  AST 33  GLUCOSE 116*    Recent Labs  10/01/14 0548 10/01/14 1034 10/02/14 1235  CKTOTAL  --   --  50  CKMB  --   --  <1.0  TROPONINI <0.30 <0.30  --    Lab Results  Component Value Date   CHOL 128 07/14/2014   HDL 37.90* 07/14/2014   LDLCALC 77 07/14/2014   TRIG 65.0 07/14/2014     Diagnostic Studies/Procedures   Dg Chest 2 View  10/01/2014   CLINICAL DATA:  Chest and back pain beginning this morning was shortness of breath.  EXAM:  CHEST  2 VIEW  COMPARISON:  PA and lateral chest 03/10/2012.  FINDINGS: The lungs are clear. No pneumothorax or pleural effusion. Heart size is normal. Defibrillator pads are in place.  IMPRESSION: No acute disease.   Electronically Signed   By: Drusilla Kannerhomas  Dalessio M.D.   On: 10/01/2014 06:03   Ct Angio Chest Aortic Dissect W &/or W/o  10/01/2014   CLINICAL DATA:  Several hr of chest pain.  EXAM: CT ANGIOGRAPHY CHEST WITH CONTRAST  TECHNIQUE: Initially, axial CT images were obtained through the chest without intravenous contrast material. Multidetector CT imaging of the chest was performed using the standard protocol during bolus administration of intravenous contrast. Multiplanar CT image reconstructions and MIPs were obtained to evaluate the vascular anatomy.  CONTRAST:  100mL OMNIPAQUE IOHEXOL 350 MG/ML SOLN  COMPARISON:  Chest radiograph October 01, 2014  FINDINGS: There is no demonstrable pulmonary embolus. There is no appreciable thoracic aortic aneurysm or dissection. Atherosclerotic change is noted in the aorta. The visualized great vessels appear widely patent.  On axial slice 28 series 506, there is a 6 x 4 mm nodular opacity in the anterior segment of the right upper lobe. On axial slice 30 series 506, there is a 6 x 4 mm nodular opacity in the posterior segment of the right upper lobe. On axial slice 31 series 506, there is a 2 mm nodular opacity in the lateral segment of the right middle lobe. On axial slice 30 series 506, there is a 2 mm nodular opacity in the medial segment of the right middle lobe. On axial slice 34 series 506, there is a 4 mm opacity in the anterior segment of the right lower lobe which probably represents a small lymph node. There is mild bibasilar atelectatic change. There are a few small, subcentimeter mediastinal lymph nodes, but there is no lymph nodes meeting size criteria for pathologic significance. Pericardium is not thickened. There is calcification in the left anterior  descending coronary artery.  There is a small hiatal hernia.  In the visualized upper abdomen, there is a small gallstone in the gallbladder. The gallbladder wall does not appear thickened. There is hepatic steatosis. There is atherosclerotic change in the aorta.  There are no blastic or lytic bone lesions. Visualized thyroid appears normal.  Review of the MIP images confirms the above findings.  IMPRESSION: No demonstrable pulmonary embolus. No thoracic aortic aneurysm or dissection. There are scattered foci of atherosclerotic change in the aorta.  Several nodular opacities, largest measuring 6 mm. Followup of these nodular opacity should be based on Fleischner Society guidelines. If the patient is at high risk for bronchogenic carcinoma, follow-up chest CT at 6-12 months is recommended. If the patient is at low risk for bronchogenic carcinoma, follow-up chest CT at 12 months is recommended. This recommendation follows the consensus statement: Guidelines for Management of Small Pulmonary  Nodules Detected on CT Scans: A Statement from the Fleischner Society as published in Radiology 2005;237:395-400.  No edema or consolidation.  Bibasilar atelectatic change present.  Small hiatal hernia. Hepatic steatosis. Small gallstone present. There is calcification in the left anterior descending coronary artery.  No appreciable adenopathy.     Study Date: 10/01/2014 LV EF: 55% -  60% Study Conclusions - Left ventricle: The cavity size was normal. Wall thickness was normal. Systolic function was normal. The estimated ejection fraction was in the range of 55% to 60%. Wall motion was normal; there were no regional wall motion abnormalities. Left ventricular diastolic function parameters were normal. - Aortic valve: There was mild stenosis. Valve area (VTI): 1.89 cm^2. Valve area (Vmax): 1.84 cm^2. Valve area (Vmean): 1.88 cm^2.   Discharge Medications     Medication List    TAKE these medications         aspirin EC 81 MG tablet  Take 81 mg by mouth daily.     atorvastatin 80 MG tablet  Commonly known as:  LIPITOR  Take 1 tablet (80 mg total) by mouth daily.     dorzolamide-timolol 22.3-6.8 MG/ML ophthalmic solution  Commonly known as:  COSOPT  Apply 1 drop to eye 2 (two) times daily.     ICAPS AREDS FORMULA PO  Take by mouth daily.     isosorbide mononitrate 30 MG 24 hr tablet  Commonly known as:  IMDUR  Take 1 tablet (30 mg total) by mouth daily.     latanoprost 0.005 % ophthalmic solution  Commonly known as:  XALATAN  Place 1 drop into both eyes at bedtime.     lisinopril 10 MG tablet  Commonly known as:  PRINIVIL,ZESTRIL  Take 1 tablet (10 mg total) by mouth daily.     multivitamin with minerals Tabs tablet  Take 1 tablet by mouth daily.     nitroGLYCERIN 0.4 MG SL tablet  Commonly known as:  NITROSTAT  Place 1 tablet (0.4 mg total) under the tongue every 5 (five) minutes as needed. For chest pain.     omega-3 acid ethyl esters 1 G capsule  Commonly known as:  LOVAZA  Take 2 capsules (2 g total) by mouth daily.        Disposition   The patient will be discharged in stable condition to home.  Follow-up Information    Follow up with Elyn Aquas., MD.   Specialty:  Cardiology   Why:  The office will call you to make an appoinment., If you do not hear from them, please contact them., You should be seen within 1-2 weeks.   Contact information:   [address] [place] [address] [phone]       Follow up with FRIED, Doris Cheadle, MD.   Specialty:  Family Medicine   Why:  Please make an appointment with your PCP to discuss abnormalities seen in chest CT- this may require further monitoring   Contact information:   [address] [place] [address] [phone]         Duration of Discharge Encounter: Greater than 30 minutes including physician and PA time.  SignedJanee Morn, Spiro Ausborn R PA-C 10/03/2014, 11:07 AM

## 2014-10-03 NOTE — Progress Notes (Addendum)
SUBJECTIVE:  No further CP or SOB  OBJECTIVE:   Vitals:   Filed Vitals:   10/03/14 0603 10/03/14 0900 10/03/14 1015 10/03/14 1016  BP: 97/31 103/44 106/49   Pulse: 61   52  Temp: 99.2 F (37.3 C)     TempSrc: Oral     Resp:      Height:      Weight: 220 lb 3.2 oz (99.882 kg)     SpO2: 96%      I&O's:   Intake/Output Summary (Last 24 hours) at 10/03/14 1034 Last data filed at 10/03/14 0900  Gross per 24 hour  Intake    120 ml  Output      0 ml  Net    120 ml   TELEMETRY: Reviewed telemetry pt in NSR:     PHYSICAL EXAM General: Well developed, well nourished, in no acute distress Head: Eyes PERRLA, No xanthomas.   Normal cephalic and atramatic  Lungs:  =Clear bilaterally to auscultation and percussion. Heart:   HRRR S1 S2 Pulses are 2+ & equal. Abdomen: Bowel sounds are positive, abdomen soft and non-tender without masses  Extremities:   No clubbing, cyanosis or edema.  DP +1 Neuro: Alert and oriented X 3. Psych:  Good affect, responds appropriately   LABS: Basic Metabolic Panel:  Recent Labs  78/29/5610/11/03 0548 10/02/14 0536  NA 139 137  K 4.2 3.7  CL 104 102  CO2 22 23  GLUCOSE 169* 116*  BUN 17 20  CREATININE 1.06 1.24  CALCIUM 9.0 8.5   Liver Function Tests:  Recent Labs  10/01/14 0548 10/02/14 0536  AST 22 33  ALT 18 42  ALKPHOS 58 69  BILITOT 0.3 0.8  PROT 6.6 6.1  ALBUMIN 3.7 3.1*    Recent Labs  10/01/14 0548  LIPASE 22   CBC:  Recent Labs  10/01/14 0548  WBC 9.4  HGB 14.0  HCT 42.3  MCV 90.0  PLT 136*   Cardiac Enzymes:  Recent Labs  10/01/14 0548 10/01/14 1034 10/02/14 1235  CKTOTAL  --   --  50  CKMB  --   --  <1.0  TROPONINI <0.30 <0.30  --    BNP: Invalid input(s): POCBNP D-Dimer: No results for input(s): DDIMER in the last 72 hours. Hemoglobin A1C: No results for input(s): HGBA1C in the last 72 hours. Fasting Lipid Panel: No results for input(s): CHOL, HDL, LDLCALC, TRIG, CHOLHDL, LDLDIRECT in the last  72 hours. Thyroid Function Tests: No results for input(s): TSH, T4TOTAL, T3FREE, THYROIDAB in the last 72 hours.  Invalid input(s): FREET3 Anemia Panel: No results for input(s): VITAMINB12, FOLATE, FERRITIN, TIBC, IRON, RETICCTPCT in the last 72 hours. Coag Panel:   Lab Results  Component Value Date   INR 1.10 10/01/2014    RADIOLOGY: Dg Chest 2 View  10/01/2014   CLINICAL DATA:  Chest and back pain beginning this morning was shortness of breath.  EXAM: CHEST  2 VIEW  COMPARISON:  PA and lateral chest 03/10/2012.  FINDINGS: The lungs are clear. No pneumothorax or pleural effusion. Heart size is normal. Defibrillator pads are in place.  IMPRESSION: No acute disease.   Electronically Signed   By: Drusilla Kannerhomas  Dalessio M.D.   On: 10/01/2014 06:03   Ct Angio Chest Aortic Dissect W &/or W/o  10/01/2014   CLINICAL DATA:  Several hr of chest pain.  EXAM: CT ANGIOGRAPHY CHEST WITH CONTRAST  TECHNIQUE: Initially, axial CT images were obtained through the chest without intravenous contrast  material. Multidetector CT imaging of the chest was performed using the standard protocol during bolus administration of intravenous contrast. Multiplanar CT image reconstructions and MIPs were obtained to evaluate the vascular anatomy.  CONTRAST:  100mL OMNIPAQUE IOHEXOL 350 MG/ML SOLN  COMPARISON:  Chest radiograph October 01, 2014  FINDINGS: There is no demonstrable pulmonary embolus. There is no appreciable thoracic aortic aneurysm or dissection. Atherosclerotic change is noted in the aorta. The visualized great vessels appear widely patent.  On axial slice 28 series 506, there is a 6 x 4 mm nodular opacity in the anterior segment of the right upper lobe. On axial slice 30 series 506, there is a 6 x 4 mm nodular opacity in the posterior segment of the right upper lobe. On axial slice 31 series 506, there is a 2 mm nodular opacity in the lateral segment of the right middle lobe. On axial slice 30 series 506, there is a 2  mm nodular opacity in the medial segment of the right middle lobe. On axial slice 34 series 506, there is a 4 mm opacity in the anterior segment of the right lower lobe which probably represents a small lymph node. There is mild bibasilar atelectatic change. There are a few small, subcentimeter mediastinal lymph nodes, but there is no lymph nodes meeting size criteria for pathologic significance. Pericardium is not thickened. There is calcification in the left anterior descending coronary artery.  There is a small hiatal hernia.  In the visualized upper abdomen, there is a small gallstone in the gallbladder. The gallbladder wall does not appear thickened. There is hepatic steatosis. There is atherosclerotic change in the aorta.  There are no blastic or lytic bone lesions. Visualized thyroid appears normal.  Review of the MIP images confirms the above findings.  IMPRESSION: No demonstrable pulmonary embolus. No thoracic aortic aneurysm or dissection. There are scattered foci of atherosclerotic change in the aorta.  Several nodular opacities, largest measuring 6 mm. Followup of these nodular opacity should be based on Fleischner Society guidelines. If the patient is at high risk for bronchogenic carcinoma, follow-up chest CT at 6-12 months is recommended. If the patient is at low risk for bronchogenic carcinoma, follow-up chest CT at 12 months is recommended. This recommendation follows the consensus statement: Guidelines for Management of Small Pulmonary Nodules Detected on CT Scans: A Statement from the Fleischner Society as published in Radiology 2005;237:395-400.  No edema or consolidation.  Bibasilar atelectatic change present.  Small hiatal hernia. Hepatic steatosis. Small gallstone present. There is calcification in the left anterior descending coronary artery.  No appreciable adenopathy.   Electronically Signed   By: Bretta BangWilliam  Woodruff M.D.   On: 10/01/2014 07:31   Assessment/Plan:   Chol: On statin target  LDL under 70 with known CAD HTN: Well controlled continue ACE Bradycardia: Stable no AV block avoid beta blockers despite CAD  NWG:NFAOCAD:cath yesterday with 60% focal instent restenosis in LAD prox to the Diag takeoff, 30% mid AV groove left circ stenosis and luminal irregularities in a large dominant RCA.  FFR of LAD was 0.85 and not felt to be hemodynamically significant to warrant PCI.  On medical management.  Patient is stable for discharge today.  Will plan outpt followup with Dr. Elease HashimotoNahser.    Quintella ReichertURNER,TRACI R, MD  10/03/2014  10:34 AM

## 2014-10-05 LAB — POCT ACTIVATED CLOTTING TIME
ACTIVATED CLOTTING TIME: 214 s
Activated Clotting Time: 230 seconds

## 2014-10-29 ENCOUNTER — Encounter (HOSPITAL_COMMUNITY): Payer: Self-pay | Admitting: Cardiovascular Disease

## 2014-10-30 ENCOUNTER — Encounter: Payer: Self-pay | Admitting: Cardiovascular Disease

## 2014-10-30 ENCOUNTER — Ambulatory Visit (INDEPENDENT_AMBULATORY_CARE_PROVIDER_SITE_OTHER): Payer: Medicare Other | Admitting: Cardiovascular Disease

## 2014-10-30 VITALS — BP 106/60 | HR 54 | Ht 70.0 in | Wt 213.4 lb

## 2014-10-30 DIAGNOSIS — I350 Nonrheumatic aortic (valve) stenosis: Secondary | ICD-10-CM

## 2014-10-30 DIAGNOSIS — I10 Essential (primary) hypertension: Secondary | ICD-10-CM

## 2014-10-30 DIAGNOSIS — I2510 Atherosclerotic heart disease of native coronary artery without angina pectoris: Secondary | ICD-10-CM

## 2014-10-30 NOTE — Assessment & Plan Note (Signed)
Anthony NobleRick is doing well. Was admitted with some chest discomfort and cath revealed mild - moderate in-stent restenosis. He was started on isosorbide. He seems to be tolerating isosorbide fairly well. He's not had any significant episodes of orthostatic hypotension.  Will continue  with current medications. I'll see him again in 6 months for followup visit. He'll call us if he has any additional episodes of chest pain or develops orthostatic hypotension.

## 2014-10-30 NOTE — Progress Notes (Signed)
Anthony JonesJohn R Carpenter Date of Birth  11/27/1940 [facility]     [place] [facility]  [address]    [address]   [address] [place], [place]  [address]    [place], [place]  [id number] [phone]  Fax  [phone]  [phone]  Fax [phone]  Problem list:  1. Coronary artery disease-status post PTCA and stenting of his mid LAD. We placed a 2.75 x 20 mm Taxus stent. It was post dilated using a 3.0 Quantum Maverick, 07/28/05 2. Hyperlipidemia 3. Possible bicuspid aortic valve   History of Present Illness:  Anthony NobleRick  is doing well.  No chest pain or dyspnea.  He plans on putting in a garden this summer .  He's building his house this year.  He was admitted to the hospital with some chest pain/abdominal pain. A Myoview study did not reveal any ischemia.  He has felt better.  No further episodes of chest pain.  He forgot to take his meds this am.  Feb. 26, 2014: Anthony NobleRick  is doing well.  He has not had any angina.  He remains active - still building houses.    July 14, 2013:  Anthony NobleRick is doing  Well.  No angina.    July 14, 2014:  Anthony NobleRick is doing well.  Still working   No CP, no dyspnea.    Dec. 11, 2015;  Anthony NobleRick was in the hospital for CP recently.  He was having vague chest discomfort.   Cath showed a 60 % instent restenosis.   Pains have resolved.    He is back doing all of his normal activities.  BP has been lower    Current Outpatient Prescriptions on File Prior to Visit  Medication Sig Dispense Refill  . aspirin EC 81 MG tablet Take 81 mg by mouth daily.    Marland Kitchen. atorvastatin (LIPITOR) 80 MG tablet Take 1 tablet (80 mg total) by mouth daily. 30 tablet 11  . dorzolamide-timolol (COSOPT) 22.3-6.8 MG/ML ophthalmic solution Apply 1 drop to eye 2 (two) times daily.     . isosorbide mononitrate (IMDUR) 30 MG 24 hr tablet Take 1 tablet (30 mg total) by mouth daily. 30 tablet 11  . latanoprost (XALATAN) 0.005 % ophthalmic solution Place 1 drop into both eyes at bedtime.     Marland Kitchen.  lisinopril (PRINIVIL,ZESTRIL) 10 MG tablet Take 1 tablet (10 mg total) by mouth daily. 90 tablet 2  . Multiple Vitamin (MULITIVITAMIN WITH MINERALS) TABS Take 1 tablet by mouth daily.    . Multiple Vitamins-Minerals (ICAPS AREDS FORMULA PO) Take by mouth daily.    . nitroGLYCERIN (NITROSTAT) 0.4 MG SL tablet Place 1 tablet (0.4 mg total) under the tongue every 5 (five) minutes as needed. For chest pain. 25 tablet 8  . omega-3 acid ethyl esters (LOVAZA) 1 G capsule Take 2 capsules (2 g total) by mouth daily. 180 capsule 3   No current facility-administered medications on file prior to visit.    No Known Allergies  Past Medical History  Diagnosis Date  . Hyperlipidemia   . Coronary artery disease     a. CAD s/p PTCA/Taxus stent to mid-prox LAD 2006  b. cath 10/02/14 with 60% focal instent restenosis in LAD prox to the Diag takeoff, 30% mid AV groove left circ stenosis and luminal irregularities in a large dominant RCA. FFR of LAD was 0.85 and not felt to be HD significant to warrant PCI.  Marland Kitchen. Bradycardia   . Bicuspid aortic valve   .  Aortic stenosis, mild   . Carotid bruit present     a. Right side  . Pulmonary nodule seen on imaging study     a. seen on CT during 09/2014 admission, may require follow up imaging     Past Surgical History  Procedure Laterality Date  . Cardiac catheterization  07/28/2005    Est EF of 55% -- Single-vessel obstructive disease in the proximal/mid left anterior descending -- Preserved left ventricular systolic function with good anterior wall motion  . Coronary angioplasty with stent placement  07/28/2005    Successful PTCA and stenting of the mid and proximal left anterior descending artery.  The patient is stable leaving the catheterization laboratory -- Vesta MixerPhilip J. Rik Wadel, M.D.    . Cataracts    . Left heart catheterization with coronary angiogram N/A 10/02/2014    Procedure: LEFT HEART CATHETERIZATION WITH CORONARY ANGIOGRAM;  Surgeon: Lennette Biharihomas A Kelly, MD;   [facility];  Service: Cardiovascular;  Laterality: N/A;    History  Smoking status  . Never Smoker   Smokeless tobacco  . Never Used    History  Alcohol Use No    Family History  Problem Relation Age of Onset  . Cancer    . Heart disease Neg Hx     Reviw of Systems:  Reviewed in the HPI.  All other systems are negative.  Physical Exam: Blood pressure 106/60, pulse 54, height 5\' 10"  (1.778 m), weight 213 lb 6.4 oz (96.798 kg). General: Well developed, well nourished, in no acute distress.  Head: Normocephalic, atraumatic, sclera non-icteric, mucus membranes are moist,   Neck: Supple. Negative for carotid bruits. JVD not elevated.  Lungs: Clear bilaterally to auscultation without wheezes, rales, or rhonchi. Breathing is unlabored.  Heart: RRR with S1 S2. No murmurs, rubs, or gallops appreciated.  Abdomen: Soft, non-tender, non-distended with normoactive bowel sounds. No hepatomegaly. No rebound/guarding. No obvious abdominal masses.  Msk:  Strength and tone appear normal for age.  Extremities: No clubbing or cyanosis. No edema.  Distal pedal pulses are 2+ and equal bilaterally.  Neuro: Alert and oriented X 3. Moves all extremities spontaneously.  Psych:  Responds to questions appropriately with a normal affect.  ECG:  Dec. 11 2015: Sinus bradycardia at a rate of 54. Associated with me. He has no ST or T wave changes.   Assessment / Plan:

## 2014-10-30 NOTE — Patient Instructions (Signed)
Your physician recommends that you continue on your current medications as directed. Please refer to the Current Medication list given to you today.  Your physician wants you to follow-up in: 6 months with Dr. Nahser.  You will receive a reminder letter in the mail two months in advance. If you don't receive a letter, please call our office to schedule the follow-up appointment.  

## 2014-10-30 NOTE — Assessment & Plan Note (Signed)
BP is well controlled 

## 2014-11-02 ENCOUNTER — Other Ambulatory Visit: Payer: Self-pay | Admitting: Nurse Practitioner

## 2014-11-02 MED ORDER — ISOSORBIDE MONONITRATE ER 30 MG PO TB24: 30.0000 mg | ORAL_TABLET | Freq: Every day | ORAL | Status: DC

## 2014-11-02 MED ORDER — ATORVASTATIN CALCIUM 80 MG PO TABS: 80.0000 mg | ORAL_TABLET | Freq: Every day | ORAL | Status: DC

## 2015-02-05 ENCOUNTER — Encounter: Payer: Self-pay | Admitting: Nurse Practitioner

## 2015-02-05 ENCOUNTER — Encounter: Payer: Self-pay | Admitting: Cardiovascular Disease

## 2015-02-05 ENCOUNTER — Ambulatory Visit (INDEPENDENT_AMBULATORY_CARE_PROVIDER_SITE_OTHER): Payer: Medicare Other | Admitting: Cardiovascular Disease

## 2015-02-05 VITALS — BP 110/78 | HR 52 | Ht 70.0 in | Wt 213.0 lb

## 2015-02-05 DIAGNOSIS — I25119 Atherosclerotic heart disease of native coronary artery with unspecified angina pectoris: Secondary | ICD-10-CM

## 2015-02-05 LAB — CBC WITH DIFFERENTIAL/PLATELET
BASOS ABS: 0 10*3/uL (ref 0.0–0.1)
Basophils Relative: 0.5 % (ref 0.0–3.0)
Eosinophils Absolute: 0.1 10*3/uL (ref 0.0–0.7)
Eosinophils Relative: 1 % (ref 0.0–5.0)
HEMATOCRIT: 42.9 % (ref 39.0–52.0)
Hemoglobin: 14.6 g/dL (ref 13.0–17.0)
LYMPHS ABS: 2.5 10*3/uL (ref 0.7–4.0)
Lymphocytes Relative: 33 % (ref 12.0–46.0)
MCHC: 34 g/dL (ref 30.0–36.0)
MCV: 84.8 fl (ref 78.0–100.0)
Monocytes Absolute: 1 10*3/uL (ref 0.1–1.0)
Monocytes Relative: 13.6 % — ABNORMAL HIGH (ref 3.0–12.0)
Neutro Abs: 3.9 10*3/uL (ref 1.4–7.7)
Neutrophils Relative %: 51.9 % (ref 43.0–77.0)
Platelets: 156 10*3/uL (ref 150.0–400.0)
RBC: 5.05 Mil/uL (ref 4.22–5.81)
RDW: 15.6 % — AB (ref 11.5–15.5)
WBC: 7.5 10*3/uL (ref 4.0–10.5)

## 2015-02-05 LAB — BASIC METABOLIC PANEL
BUN: 12 mg/dL (ref 6–23)
CHLORIDE: 105 meq/L (ref 96–112)
CO2: 30 meq/L (ref 19–32)
Calcium: 9.2 mg/dL (ref 8.4–10.5)
Creatinine, Ser: 0.94 mg/dL (ref 0.40–1.50)
GFR: 83.2 mL/min (ref >60.00)
Glucose, Bld: 96 mg/dL (ref 70–99)
Potassium: 4.1 mEq/L (ref 3.5–5.1)
Sodium: 138 mEq/L (ref 135–145)

## 2015-02-05 LAB — PROTIME-INR
INR: 1.1 ratio — ABNORMAL HIGH (ref 0.8–1.0)
PROTHROMBIN TIME: 12.3 s (ref 9.6–13.1)

## 2015-02-05 NOTE — Progress Notes (Signed)
Cardiology Office Note   Date:  02/05/2015   ID:  Anthony Carpenter, DOB [birth year], MRN [id number]  PCP:  Lenora Boys, MD  Cardiologist:   Vesta Mixer, MD   Chief Complaint  Patient presents with  . Coronary Artery Disease   1. Coronary artery disease-status post PTCA and stenting of his mid LAD. We placed a 2.75 x 20 mm Taxus stent. It was post dilated using a 3.0 Quantum Maverick, 07/28/05  2. Hyperlipidemia-   3. Possible bicuspid aortic valve   History of Present Illness:  Anthony Carpenter is doing well. No chest pain or dyspnea. He plans on putting in a garden this summer . He's building his house this year. He was admitted to the hospital with some chest pain/abdominal pain. A Myoview study did not reveal any ischemia.  He has felt better. No further episodes of chest pain. He forgot to take his meds this am.  Feb. 26, 2014: Anthony Carpenter is doing well. He has not had any angina. He remains active - still building houses.   July 14, 2013:  Anthony Carpenter is doing Well. No angina.   July 14, 2014:  Anthony Carpenter is doing well. Still working No CP, no dyspnea.   Dec. 11, 2015;  Anthony Carpenter was in the hospital for CP recently. He was having vague chest discomfort. Cath showed a 60 % instent restenosis. Pains have resolved. He is back doing all of his normal activities. BP has been lower    February 05, 2015:  Anthony Carpenter is a 74 y.o. male who presents for some CP and DOE for the past month. 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   Will last as long as he's working . Eases up if he stops.    Past Medical History  Diagnosis Date  . Hyperlipidemia   . Coronary artery disease     a. CAD s/p PTCA/Taxus stent to mid-prox LAD 2006  b. cath 10/02/14 with 60% focal instent restenosis in LAD prox to the Diag takeoff, 30% mid AV groove left circ stenosis and luminal irregularities in a large dominant RCA. FFR of LAD was 0.85 and not felt to be HD significant  to warrant PCI.  Marland Kitchen Bradycardia   . Bicuspid aortic valve   . Aortic stenosis, mild   . Carotid bruit present     a. Right side  . Pulmonary nodule seen on imaging study     a. seen on CT during 09/2014 admission, may require follow up imaging     Past Surgical History  Procedure Laterality Date  . Cardiac catheterization  07/28/2005    Est EF of 55% -- Single-vessel obstructive disease in the proximal/mid left anterior descending -- Preserved left ventricular systolic function with good anterior wall motion  . Coronary angioplasty with stent placement  07/28/2005    Successful PTCA and stenting of the mid and proximal left anterior descending artery.  The patient is stable leaving the catheterization laboratory -- Vesta Mixer, M.D.    . Cataracts    . Left heart catheterization with coronary angiogram N/A 10/02/2014    Procedure: LEFT HEART CATHETERIZATION WITH CORONARY ANGIOGRAM;  Surgeon: Lennette Bihari, MD;  Location: [facility] CATH LAB;  Service: Cardiovascular;  Laterality: N/A;     Current Outpatient Prescriptions  Medication Sig Dispense Refill  . aspirin EC 81 MG tablet Take 81 mg by mouth daily.    Marland Kitchen atorvastatin (LIPITOR) 80 MG tablet Take 1 tablet (80  mg total) by mouth daily. 90 tablet 3  . dorzolamide-timolol (COSOPT) 22.3-6.8 MG/ML ophthalmic solution Apply 1 drop to eye 2 (two) times daily.     . isosorbide mononitrate (IMDUR) 30 MG 24 hr tablet Take 1 tablet (30 mg total) by mouth daily. 90 tablet 3  . latanoprost (XALATAN) 0.005 % ophthalmic solution Place 1 drop into both eyes at bedtime.     Marland Kitchen lisinopril (PRINIVIL,ZESTRIL) 10 MG tablet Take 1 tablet (10 mg total) by mouth daily. 90 tablet 2  . Multiple Vitamin (MULITIVITAMIN WITH MINERALS) TABS Take 1 tablet by mouth daily.    . Multiple Vitamins-Minerals (ICAPS AREDS FORMULA PO) Take by mouth daily.    . nitroGLYCERIN (NITROSTAT) 0.4 MG SL tablet Place 1 tablet (0.4 mg total) under the tongue every 5 (five) minutes  as needed. For chest pain. 25 tablet 8  . omega-3 acid ethyl esters (LOVAZA) 1 G capsule Take 2 capsules (2 g total) by mouth daily. 180 capsule 3   No current facility-administered medications for this visit.    Allergies:   Review of patient's allergies indicates no known allergies.    Social History:  The patient  reports that he has never smoked. He has never used smokeless tobacco. He reports that he does not drink alcohol or use illicit drugs.   Family History:  The patient's family history includes Cancer in an other family member; Lung cancer in his father. There is no history of Heart disease.    ROS:  Please see the history of present illness.    Review of Systems: Constitutional:  denies fever, chills, diaphoresis, appetite change and fatigue.  HEENT: denies photophobia, eye pain, redness, hearing loss, ear pain, congestion, sore throat, rhinorrhea, sneezing, neck pain, neck stiffness and tinnitus.  Respiratory: denies SOB, DOE, cough, chest tightness, and wheezing.  Cardiovascular: denies chest pain, palpitations and leg swelling.  Gastrointestinal: denies nausea, vomiting, abdominal pain, diarrhea, constipation, blood in stool.  Genitourinary: denies dysuria, urgency, frequency, hematuria, flank pain and difficulty urinating.  Musculoskeletal: denies  myalgias, back pain, joint swelling, arthralgias and gait problem.   Skin: denies pallor, rash and wound.  Neurological: denies dizziness, seizures, syncope, weakness, light-headedness, numbness and headaches.   Hematological: denies adenopathy, easy bruising, personal or family bleeding history.  Psychiatric/ Behavioral: denies suicidal ideation, mood changes, confusion, nervousness, sleep disturbance and agitation.       All other systems are reviewed and negative.    PHYSICAL EXAM: VS:  BP 110/78 mmHg  Pulse 52  Ht  (1.778 m)  Wt 213 lb (96.616 kg)  BMI 30.56 kg/m2  SpO2 97% , BMI Body mass index is 30.56  kg/(m^2). GEN: Well nourished, well developed, in no acute distress HEENT: normal Neck: no JVD, carotid bruits, or masses Cardiac: RRR; no murmurs, rubs, or gallops,no edema  Respiratory:  clear to auscultation bilaterally, normal work of breathing GI: soft, nontender, nondistended, + BS MS: no deformity or atrophy Skin: warm and dry, no rash Neuro:  Strength and sensation are intact Psych: normal   EKG:  EKG is not ordered today.   Recent Labs: 10/01/2014: Hemoglobin 14.0; Platelets 136*; Pro B Natriuretic peptide (BNP) 241.6* 10/02/2014: ALT 42; BUN 20; Creatinine 1.24; Potassium 3.7; Sodium 137    Lipid Panel    Component Value Date/Time   CHOL 128 07/14/2014 1008   TRIG 65.0 07/14/2014 1008   HDL 37.90* 07/14/2014 1008   CHOLHDL 3 07/14/2014 1008   VLDL 13.0 07/14/2014 1008  LDLCALC 77 07/14/2014 1008      Wt Readings from Last 3 Encounters:  02/05/15 213 lb (96.616 kg)  10/30/14 213 lb 6.4 oz (96.798 kg)  10/03/14 220 lb 3.2 oz (99.882 kg)      Other studies Reviewed: Additional studies/ records that were reviewed today include: . Review of the above records demonstrates:    ASSESSMENT AND PLAN:  1. Coronary artery disease-status post PTCA and stenting of his mid LAD. We placed a 2.75 x 20 mm Taxus stent. It was post dilated using a 3.0 Quantum Maverick, 07/28/05.   He now presents with fairly predictable episodes of unstable angina. He has chest discomfort, he most the lawn or pushes will bear. At this point I do not think that stress Myoview study would be helpful since he's having fairly predictable chest pain.  We'll set him up for cardiac catheterization next week. I've recommended that he take it easy for the next several days.  We discussed the risks, benefits, and options of cardiac catheterization. He understands and agrees to proceed.  2. Hyperlipidemia-   3. Possible bicuspid aortic valve   Current medicines are reviewed at length with the  patient today.  The patient does not have concerns regarding medicines.  The following changes have been made:  no change  Labs/ tests ordered today include:  No orders of the defined types were placed in this encounter.    Disposition:   FU with me in several months.     Signed, Alvilda Mckenna, Deloris PingPhilip J, MD  02/05/2015 11:04 AM    [facility] [address], [place], [place]  [address] Phone: [phone]; Fax: [phone]

## 2015-02-05 NOTE — Patient Instructions (Addendum)
Your physician recommends that you continue on your current medications as directed. Please refer to the Current Medication list given to you today.  Your physician has requested that you have a cardiac catheterization. Cardiac catheterization is used to diagnose and/or treat various heart conditions. Doctors may recommend this procedure for a number of different reasons. The most common reason is to evaluate chest pain. Chest pain can be a symptom of coronary artery disease (CAD), and cardiac catheterization can show whether plaque is narrowing or blocking your heart's arteries. This procedure is also used to evaluate the valves, as well as measure the blood flow and oxygen levels in different parts of your heart. For further information please visit [email or web address] Please follow instruction sheet, as given.  Your physician recommends that you have lab work:  TODAY - CBC, BMET, INR/PT  Your physician recommends that you follow-up in: 4 weeks with Dr. Elease HashimotoNahser on Wed. April 27 @ 10:45

## 2015-02-11 ENCOUNTER — Ambulatory Visit (HOSPITAL_COMMUNITY)
Admission: RE | Admit: 2015-02-11 | Discharge: 2015-02-12 | Disposition: A | Discharge status: Home / Self Care | Payer: Medicare Other | Source: Ambulatory Visit | Attending: Cardiovascular Disease | Admitting: Cardiovascular Disease

## 2015-02-11 ENCOUNTER — Encounter (HOSPITAL_COMMUNITY)
Admission: RE | Disposition: A | Discharge status: Home / Self Care | Payer: Self-pay | Source: Ambulatory Visit | Attending: Cardiovascular Disease

## 2015-02-11 ENCOUNTER — Encounter (HOSPITAL_COMMUNITY): Payer: Self-pay | Admitting: General Practice

## 2015-02-11 DIAGNOSIS — Z955 Presence of coronary angioplasty implant and graft: Secondary | ICD-10-CM

## 2015-02-11 DIAGNOSIS — T82858A Stenosis of vascular prosthetic devices, implants and grafts, initial encounter: Secondary | ICD-10-CM | POA: Insufficient documentation

## 2015-02-11 DIAGNOSIS — I25110 Atherosclerotic heart disease of native coronary artery with unstable angina pectoris: Secondary | ICD-10-CM | POA: Diagnosis not present

## 2015-02-11 DIAGNOSIS — Z7982 Long term (current) use of aspirin: Secondary | ICD-10-CM | POA: Insufficient documentation

## 2015-02-11 DIAGNOSIS — I200 Unstable angina: Secondary | ICD-10-CM | POA: Diagnosis present

## 2015-02-11 DIAGNOSIS — R0989 Other specified symptoms and signs involving the circulatory and respiratory systems: Secondary | ICD-10-CM | POA: Insufficient documentation

## 2015-02-11 DIAGNOSIS — I25119 Atherosclerotic heart disease of native coronary artery with unspecified angina pectoris: Secondary | ICD-10-CM

## 2015-02-11 DIAGNOSIS — Z79899 Other long term (current) drug therapy: Secondary | ICD-10-CM | POA: Diagnosis not present

## 2015-02-11 DIAGNOSIS — E785 Hyperlipidemia, unspecified: Secondary | ICD-10-CM

## 2015-02-11 DIAGNOSIS — I350 Nonrheumatic aortic (valve) stenosis: Secondary | ICD-10-CM | POA: Diagnosis not present

## 2015-02-11 DIAGNOSIS — Y832 Surgical operation with anastomosis, bypass or graft as the cause of abnormal reaction of the patient, or of later complication, without mention of misadventure at the time of the procedure: Secondary | ICD-10-CM | POA: Diagnosis not present

## 2015-02-11 DIAGNOSIS — I10 Essential (primary) hypertension: Secondary | ICD-10-CM | POA: Diagnosis not present

## 2015-02-11 DIAGNOSIS — I2510 Atherosclerotic heart disease of native coronary artery without angina pectoris: Secondary | ICD-10-CM

## 2015-02-11 HISTORY — DX: Essential (primary) hypertension: I10

## 2015-02-11 HISTORY — PX: CORONARY STENT PLACEMENT: SHX1402

## 2015-02-11 HISTORY — PX: LEFT HEART CATHETERIZATION WITH CORONARY ANGIOGRAM: SHX5451

## 2015-02-11 LAB — POCT ACTIVATED CLOTTING TIME: ACTIVATED CLOTTING TIME: 288 s

## 2015-02-11 LAB — CK: Total CK: 42 U/L (ref 7–232)

## 2015-02-11 SURGERY — LEFT HEART CATHETERIZATION WITH CORONARY ANGIOGRAM
Anesthesia: LOCAL

## 2015-02-11 MED ORDER — HEPARIN SODIUM (PORCINE) 1000 UNIT/ML IJ SOLN
INTRAMUSCULAR | Status: AC
  Filled 2015-02-11: qty 1

## 2015-02-11 MED ORDER — LISINOPRIL 10 MG PO TABS
10.0000 mg | ORAL_TABLET | Freq: Every day | ORAL | Status: DC
  Administered 2015-02-12: 11:00:00 10 mg via ORAL
  Filled 2015-02-11: qty 1

## 2015-02-11 MED ORDER — ATORVASTATIN CALCIUM 80 MG PO TABS
80.0000 mg | ORAL_TABLET | Freq: Every day | ORAL | Status: DC
  Administered 2015-02-11 – 2015-02-12 (×2): 80 mg via ORAL
  Filled 2015-02-11 (×2): qty 1

## 2015-02-11 MED ORDER — MIDAZOLAM HCL 2 MG/2ML IJ SOLN
INTRAMUSCULAR | Status: AC
  Filled 2015-02-11: qty 2

## 2015-02-11 MED ORDER — ONDANSETRON HCL 4 MG/2ML IJ SOLN: 4.0000 mg | Freq: Four times a day (QID) | INTRAMUSCULAR | Status: DC | PRN

## 2015-02-11 MED ORDER — SODIUM CHLORIDE 0.9 % IV SOLN
INTRAVENOUS | Status: DC
  Administered 2015-02-11: 09:00:00 via INTRAVENOUS

## 2015-02-11 MED ORDER — CLOPIDOGREL BISULFATE 300 MG PO TABS
ORAL_TABLET | ORAL | Status: AC
  Filled 2015-02-11: qty 1

## 2015-02-11 MED ORDER — LATANOPROST 0.005 % OP SOLN
1.0000 [drp] | Freq: Every day | OPHTHALMIC | Status: DC
  Administered 2015-02-11: 21:00:00 1 [drp] via OPHTHALMIC
  Filled 2015-02-11: qty 2.5

## 2015-02-11 MED ORDER — ISOSORBIDE MONONITRATE ER 30 MG PO TB24
30.0000 mg | ORAL_TABLET | Freq: Every day | ORAL | Status: DC
  Administered 2015-02-12: 11:00:00 30 mg via ORAL
  Filled 2015-02-11: qty 1

## 2015-02-11 MED ORDER — SODIUM CHLORIDE 0.9 % IJ SOLN: 3.0000 mL | Freq: Two times a day (BID) | INTRAMUSCULAR | Status: DC

## 2015-02-11 MED ORDER — SODIUM CHLORIDE 0.9 % IJ SOLN: 3.0000 mL | INTRAMUSCULAR | Status: DC | PRN

## 2015-02-11 MED ORDER — HEPARIN (PORCINE) IN NACL 2-0.9 UNIT/ML-% IJ SOLN
INTRAMUSCULAR | Status: AC
  Filled 2015-02-11: qty 1500

## 2015-02-11 MED ORDER — ACETAMINOPHEN 325 MG PO TABS: 650.0000 mg | ORAL_TABLET | ORAL | Status: DC | PRN

## 2015-02-11 MED ORDER — LIDOCAINE HCL (PF) 1 % IJ SOLN
INTRAMUSCULAR | Status: AC
  Filled 2015-02-11: qty 30

## 2015-02-11 MED ORDER — DORZOLAMIDE HCL-TIMOLOL MAL 2-0.5 % OP SOLN
1.0000 [drp] | Freq: Two times a day (BID) | OPHTHALMIC | Status: DC
  Administered 2015-02-11 – 2015-02-12 (×2): 1 [drp] via OPHTHALMIC
  Filled 2015-02-11: qty 10

## 2015-02-11 MED ORDER — SODIUM CHLORIDE 0.9 % IV SOLN: 250.0000 mL | INTRAVENOUS | Status: DC | PRN

## 2015-02-11 MED ORDER — ASPIRIN 81 MG PO CHEW: 81.0000 mg | CHEWABLE_TABLET | ORAL | Status: DC

## 2015-02-11 MED ORDER — FENTANYL CITRATE 0.05 MG/ML IJ SOLN
INTRAMUSCULAR | Status: AC
  Filled 2015-02-11: qty 2

## 2015-02-11 MED ORDER — ASPIRIN EC 81 MG PO TBEC
81.0000 mg | DELAYED_RELEASE_TABLET | Freq: Every day | ORAL | Status: DC
  Administered 2015-02-12: 81 mg via ORAL
  Filled 2015-02-11: qty 1

## 2015-02-11 MED ORDER — VERAPAMIL HCL 2.5 MG/ML IV SOLN
INTRAVENOUS | Status: AC
  Filled 2015-02-11: qty 2

## 2015-02-11 MED ORDER — SODIUM CHLORIDE 0.9 % IV SOLN: INTRAVENOUS | Status: AC

## 2015-02-11 MED ORDER — ANGIOPLASTY BOOK
Freq: Once | Status: AC
Start: 2015-02-11 — End: 2015-02-11
  Administered 2015-02-11: 22:00:00
  Filled 2015-02-11: qty 1

## 2015-02-11 MED ORDER — CLOPIDOGREL BISULFATE 75 MG PO TABS
75.0000 mg | ORAL_TABLET | Freq: Every day | ORAL | Status: DC
  Administered 2015-02-12: 75 mg via ORAL
  Filled 2015-02-11: qty 1

## 2015-02-11 NOTE — Progress Notes (Signed)
TR BAND REMOVAL  LOCATION:    right radial  DEFLATED PER PROTOCOL:    Yes.    TIME BAND OFF / DRESSING APPLIED: 1630      SITE UPON ARRIVAL:    Level 0  SITE AFTER BAND REMOVAL:    Level 0  REVERSE ALLEN'S TEST:     positive  CIRCULATION SENSATION AND MOVEMENT:    Within Normal Limits   Yes.    COMMENTS:   Tolerated procedure well 

## 2015-02-11 NOTE — Progress Notes (Signed)
UR Completed Corbin Falck Graves-Bigelow, RN,BSN [phone]  

## 2015-02-11 NOTE — CV Procedure (Signed)
Cardiac Catheterization Operative Report  Eber JonesJohn R Humiston [id number] 3/24/20161:01 PM FRIED, Doris CheadleOBERT L, MD  Procedure Performed:  1. Left Heart Catheterization 2. Selective Coronary Angiography 3. Left ventricular angiogram 4. PTCA/DES x 1 mid LAD  Operator: Verne Carrowhristopher McAlhany, MD  Arterial access site:  Right radial artery.   Indication: 74 yo pt with history of CAD with prior Taxus DES mid LAD in 2006 with recent chest pain c/w unstable angina-class III.                                       Procedure Details: The risks, benefits, complications, treatment options, and expected outcomes were discussed with the patient. The patient and/or family concurred with the proposed plan, giving informed consent. The patient was brought to the cath lab after IV hydration was begun and oral premedication was given. The patient was further sedated with Versed and Fentanyl. The right wrist was assessed with a modified Allens test which was positive. The right wrist was prepped and draped in a sterile fashion. 1% lidocaine was used for local anesthesia. Using the modified Seldinger access technique, a 5 French sheath was placed in the right radial artery. 3 mg Verapamil was given through the sheath. 5000 units IV heparin was given. Standard diagnostic catheters were used to perform selective coronary angiography. A pigtail catheter was used to perform a left ventricular angiogram. He was found to have a severe stenosis in the mid LAD in the previously stented area. I elected to proceed to PCI.   PCI Note: He was given an additional 5000 units IV heparin. ACT was 288. He was given Plavix 600 mg po x 1. I then engaged the left main with a XB LAD 3.5 guiding catheter. I passed a Cougar IC wire down the LAD. A 2.5 x 12 mm balloon was used to pre-dilate the mid LAD stenosis within the old stent. I then deployed a 3.0 x 16 mm Promus Premier DES in the mid LAD. The stent was post-dilated with a 3.25 x 12 mm  Elbing balloon x 1. The stenosis was taken from 99% down to 0%.   The sheath was removed from the right radial artery and a Terumo hemostasis band was applied at the arteriotomy site on the right wrist. There were no immediate complications. The patient was taken to the recovery area in stable condition.   Hemodynamic Findings: Central aortic pressure: 92/47 Left ventricular pressure: 95/5/16  Angiographic Findings:  Left main: No obstructive disease.   Left Anterior Descending Artery: Large caliber vessel that courses to the apex. The mid segment is stented. The old stent has 99% restenosis. The distal vessel is free of obstructive disease. The Diagonal branch is moderate in caliber with ostial 30% stenosis where the vessel is jailed by the LAD stent.   Circumflex Artery: Large caliber vessel with moderate caliber obtuse marginal branch. The obtuse marginal branch has proximal 50% stenosis.   Right Coronary Artery: Large dominant vessel with 30% mid stenosis.   Left Ventricular Angiogram: LVEF=45-50% with hypokinesis of the anterior wall and apex.   Impression: 1. Severe single vessel CAD 2. Unstable angina secondary to severe mid LAD restenotic lesion 3. Mild segmental LV systolic dysfunction 4. Successful PTCA/DES x 1 mid LAD  Recommendations: He will need DAPT with ASA and Plavix for one year. Continue statin and beta blocker.  Complications:  None. The patient tolerated the procedure well.

## 2015-02-11 NOTE — Interval H&P Note (Signed)
History and Physical Interval Note:  02/11/2015 11:35 AM  Anthony JonesJohn R Carpenter  has presented today for cardiac cath with the diagnosis of unstable angina. The various methods of treatment have been discussed with the patient and family. After consideration of risks, benefits and other options for treatment, the patient has consented to  Procedure(s): LEFT HEART CATHETERIZATION WITH CORONARY ANGIOGRAM (N/A) as a surgical intervention .  The patient's history has been reviewed, patient examined, no change in status, stable for surgery.  I have reviewed the patient's chart and labs.  Questions were answered to the patient's satisfaction.    Cath Lab Visit (complete for each Cath Lab visit)  Clinical Evaluation Leading to the Procedure:   ACS: No.  Non-ACS:    Anginal Classification: CCS III  Anti-ischemic medical therapy: Minimal Therapy (1 class of medications)  Non-Invasive Test Results: No non-invasive testing performed  Prior CABG: No previous CABG        Carpenter,Anthony

## 2015-02-11 NOTE — H&P (View-Only) (Signed)
 Cardiology Office Note   Date:  02/05/2015   ID:  Anthony Carpenter, DOB 12/17/1940, MRN [id number]  PCP:  FRIED, ROBERT L, MD  Cardiologist:   Xaine Sansom J, MD   Chief Complaint  Patient presents with  . Coronary Artery Disease   1. Coronary artery disease-status post PTCA and stenting of his mid LAD. We placed a 2.75 x 20 mm Taxus stent. It was post dilated using a 3.0 Quantum Maverick, 07/28/05  2. Hyperlipidemia-   3. Possible bicuspid aortic valve   History of Present Illness:  Anthony Carpenter is doing well. No chest pain or dyspnea. He plans on putting in a garden this summer . He's building his house this year. He was admitted to the hospital with some chest pain/abdominal pain. A Myoview study did not reveal any ischemia.  He has felt better. No further episodes of chest pain. He forgot to take his meds this am.  Feb. 26, 2014: Anthony Carpenter is doing well. He has not had any angina. He remains active - still building houses.   July 14, 2013:  Anthony Carpenter is doing Well. No angina.   July 14, 2014:  Anthony Carpenter is doing well. Still working No CP, no dyspnea.   Dec. 11, 2015;  Anthony Carpenter was in the hospital for CP recently. He was having vague chest discomfort. Cath showed a 60 % instent restenosis. Pains have resolved. He is back doing all of his normal activities. BP has been lower    February 05, 2015:  Anthony Carpenter is a 74 y.o. male who presents for some CP and DOE for the past month. 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   Will last as long as he's working . Eases up if he stops.    Past Medical History  Diagnosis Date  . Hyperlipidemia   . Coronary artery disease     a. CAD s/p PTCA/Taxus stent to mid-prox LAD 2006  b. cath 10/02/14 with 60% focal instent restenosis in LAD prox to the Diag takeoff, 30% mid AV groove left circ stenosis and luminal irregularities in a large dominant RCA. FFR of LAD was 0.85 and not felt to be HD significant  to warrant PCI.  . Bradycardia   . Bicuspid aortic valve   . Aortic stenosis, mild   . Carotid bruit present     a. Right side  . Pulmonary nodule seen on imaging study     a. seen on CT during 09/2014 admission, may require follow up imaging     Past Surgical History  Procedure Laterality Date  . Cardiac catheterization  07/28/2005    Est EF of 55% -- Single-vessel obstructive disease in the proximal/mid left anterior descending -- Preserved left ventricular systolic function with good anterior wall motion  . Coronary angioplasty with stent placement  07/28/2005    Successful PTCA and stenting of the mid and proximal left anterior descending artery.  The patient is stable leaving the catheterization laboratory -- Kemiah Booz J. Daquarius Dubeau, M.D.    . Cataracts    . Left heart catheterization with coronary angiogram N/A 10/02/2014    Procedure: LEFT HEART CATHETERIZATION WITH CORONARY ANGIOGRAM;  Surgeon: Thomas A Kelly, MD;  Location: [facility] CATH LAB;  Service: Cardiovascular;  Laterality: N/A;     Current Outpatient Prescriptions  Medication Sig Dispense Refill  . aspirin EC 81 MG tablet Take 81 mg by mouth daily.    . atorvastatin (LIPITOR) 80 MG tablet Take 1 tablet (80   mg total) by mouth daily. 90 tablet 3  . dorzolamide-timolol (COSOPT) 22.3-6.8 MG/ML ophthalmic solution Apply 1 drop to eye 2 (two) times daily.     . isosorbide mononitrate (IMDUR) 30 MG 24 hr tablet Take 1 tablet (30 mg total) by mouth daily. 90 tablet 3  . latanoprost (XALATAN) 0.005 % ophthalmic solution Place 1 drop into both eyes at bedtime.     . lisinopril (PRINIVIL,ZESTRIL) 10 MG tablet Take 1 tablet (10 mg total) by mouth daily. 90 tablet 2  . Multiple Vitamin (MULITIVITAMIN WITH MINERALS) TABS Take 1 tablet by mouth daily.    . Multiple Vitamins-Minerals (ICAPS AREDS FORMULA PO) Take by mouth daily.    . nitroGLYCERIN (NITROSTAT) 0.4 MG SL tablet Place 1 tablet (0.4 mg total) under the tongue every 5 (five) minutes  as needed. For chest pain. 25 tablet 8  . omega-3 acid ethyl esters (LOVAZA) 1 G capsule Take 2 capsules (2 g total) by mouth daily. 180 capsule 3   No current facility-administered medications for this visit.    Allergies:   Review of patient's allergies indicates no known allergies.    Social History:  The patient  reports that he has never smoked. He has never used smokeless tobacco. He reports that he does not drink alcohol or use illicit drugs.   Family History:  The patient's family history includes Cancer in an other family member; Lung cancer in his father. There is no history of Heart disease.    ROS:  Please see the history of present illness.    Review of Systems: Constitutional:  denies fever, chills, diaphoresis, appetite change and fatigue.  HEENT: denies photophobia, eye pain, redness, hearing loss, ear pain, congestion, sore throat, rhinorrhea, sneezing, neck pain, neck stiffness and tinnitus.  Respiratory: denies SOB, DOE, cough, chest tightness, and wheezing.  Cardiovascular: denies chest pain, palpitations and leg swelling.  Gastrointestinal: denies nausea, vomiting, abdominal pain, diarrhea, constipation, blood in stool.  Genitourinary: denies dysuria, urgency, frequency, hematuria, flank pain and difficulty urinating.  Musculoskeletal: denies  myalgias, back pain, joint swelling, arthralgias and gait problem.   Skin: denies pallor, rash and wound.  Neurological: denies dizziness, seizures, syncope, weakness, light-headedness, numbness and headaches.   Hematological: denies adenopathy, easy bruising, personal or family bleeding history.  Psychiatric/ Behavioral: denies suicidal ideation, mood changes, confusion, nervousness, sleep disturbance and agitation.       All other systems are reviewed and negative.    PHYSICAL EXAM: VS:  BP 110/78 mmHg  Pulse 52  Ht 5' 10" (1.778 m)  Wt 213 lb (96.616 kg)  BMI 30.56 kg/m2  SpO2 97% , BMI Body mass index is 30.56  kg/(m^2). GEN: Well nourished, well developed, in no acute distress HEENT: normal Neck: no JVD, carotid bruits, or masses Cardiac: RRR; no murmurs, rubs, or gallops,no edema  Respiratory:  clear to auscultation bilaterally, normal work of breathing GI: soft, nontender, nondistended, + BS MS: no deformity or atrophy Skin: warm and dry, no rash Neuro:  Strength and sensation are intact Psych: normal   EKG:  EKG is not ordered today.   Recent Labs: 10/01/2014: Hemoglobin 14.0; Platelets 136*; Pro B Natriuretic peptide (BNP) 241.6* 10/02/2014: ALT 42; BUN 20; Creatinine 1.24; Potassium 3.7; Sodium 137    Lipid Panel    Component Value Date/Time   CHOL 128 07/14/2014 1008   TRIG 65.0 07/14/2014 1008   HDL 37.90* 07/14/2014 1008   CHOLHDL 3 07/14/2014 1008   VLDL 13.0 07/14/2014 1008     LDLCALC 77 07/14/2014 1008      Wt Readings from Last 3 Encounters:  02/05/15 213 lb (96.616 kg)  10/30/14 213 lb 6.4 oz (96.798 kg)  10/03/14 220 lb 3.2 oz (99.882 kg)      Other studies Reviewed: Additional studies/ records that were reviewed today include: . Review of the above records demonstrates:    ASSESSMENT AND PLAN:  1. Coronary artery disease-status post PTCA and stenting of his mid LAD. We placed a 2.75 x 20 mm Taxus stent. It was post dilated using a 3.0 Quantum Maverick, 07/28/05.   He now presents with fairly predictable episodes of unstable angina. He has chest discomfort, he most the lawn or pushes will bear. At this point I do not think that stress Myoview study would be helpful since he's having fairly predictable chest pain.  We'll set him up for cardiac catheterization next week. I've recommended that he take it easy for the next several days.  We discussed the risks, benefits, and options of cardiac catheterization. He understands and agrees to proceed.  2. Hyperlipidemia-   3. Possible bicuspid aortic valve   Current medicines are reviewed at length with the  patient today.  The patient does not have concerns regarding medicines.  The following changes have been made:  no change  Labs/ tests ordered today include:  No orders of the defined types were placed in this encounter.    Disposition:   FU with me in several months.     Signed, Purvi Ruehl J, MD  02/05/2015 11:04 AM    [facility] [address], [place], [place]  [address] Phone: [phone]; Fax: [phone]    

## 2015-02-12 ENCOUNTER — Encounter (HOSPITAL_COMMUNITY): Payer: Self-pay | Admitting: Physician Assistant

## 2015-02-12 DIAGNOSIS — T82858A Stenosis of vascular prosthetic devices, implants and grafts, initial encounter: Secondary | ICD-10-CM | POA: Diagnosis not present

## 2015-02-12 DIAGNOSIS — I200 Unstable angina: Secondary | ICD-10-CM

## 2015-02-12 DIAGNOSIS — I10 Essential (primary) hypertension: Secondary | ICD-10-CM

## 2015-02-12 DIAGNOSIS — I2510 Atherosclerotic heart disease of native coronary artery without angina pectoris: Secondary | ICD-10-CM

## 2015-02-12 DIAGNOSIS — E785 Hyperlipidemia, unspecified: Secondary | ICD-10-CM

## 2015-02-12 DIAGNOSIS — I25110 Atherosclerotic heart disease of native coronary artery with unstable angina pectoris: Secondary | ICD-10-CM | POA: Diagnosis not present

## 2015-02-12 LAB — CBC
HCT: 41.8 % (ref 39.0–52.0)
Hemoglobin: 13.9 g/dL (ref 13.0–17.0)
MCH: 28.7 pg (ref 26.0–34.0)
MCHC: 33.3 g/dL (ref 30.0–36.0)
MCV: 86.4 fL (ref 78.0–100.0)
Platelets: 141 10*3/uL — ABNORMAL LOW (ref 150–400)
RBC: 4.84 MIL/uL (ref 4.22–5.81)
RDW: 14.9 % (ref 11.5–15.5)
WBC: 7.6 10*3/uL (ref 4.0–10.5)

## 2015-02-12 LAB — BASIC METABOLIC PANEL
ANION GAP: 4 — AB (ref 5–15)
BUN: 12 mg/dL (ref 6–23)
CALCIUM: 8.7 mg/dL (ref 8.4–10.5)
CO2: 24 mmol/L (ref 19–32)
Chloride: 109 mmol/L (ref 96–112)
Creatinine, Ser: 0.9 mg/dL (ref 0.50–1.35)
GFR calc Af Amer: >90 mL/min (ref >90)
GFR, EST NON AFRICAN AMERICAN: 82 mL/min — AB (ref >90)
Glucose, Bld: 98 mg/dL (ref 70–99)
Potassium: 3.6 mmol/L (ref 3.5–5.1)
Sodium: 137 mmol/L (ref 135–145)

## 2015-02-12 MED ORDER — CLOPIDOGREL BISULFATE 75 MG PO TABS: 75.0000 mg | ORAL_TABLET | Freq: Every day | ORAL | Status: DC

## 2015-02-12 MED ORDER — CLOPIDOGREL BISULFATE 75 MG PO TABS
75.0000 mg | ORAL_TABLET | Freq: Every day | ORAL | Status: DC
Start: 2015-02-12 — End: 2015-03-12

## 2015-02-12 MED ORDER — NITROGLYCERIN 0.4 MG SL SUBL: 0.4000 mg | SUBLINGUAL_TABLET | SUBLINGUAL | Status: DC | PRN

## 2015-02-12 NOTE — Progress Notes (Signed)
CARDIAC REHAB PHASE I   PRE:  Rate/Rhythm: 60 SR    BP: sitting 118/54    SaO2:   MODE:  Ambulation: 650 ft   POST:  Rate/Rhythm: 84 SR    BP: sitting 158/63     SaO2:   Tolerated well, no c/o. Ed completed with wife present. [facility] and will send referral to [place]. [phone]   Harriet MassonReeve, Bradden Tadros Kristan CES, ACSM 02/12/2015 9:44 AM

## 2015-02-12 NOTE — Discharge Instructions (Signed)
PLEASE REMEMBER TO BRING ALL OF YOUR MEDICATIONS TO EACH OF YOUR FOLLOW-UP OFFICE VISITS. ° °PLEASE ATTEND ALL SCHEDULED FOLLOW-UP APPOINTMENTS.  ° °Activity: Increase activity slowly as tolerated. You may shower, but no soaking baths (or swimming) for 1 week. No driving for 2 days. No lifting over 5 lbs for 1 week. No sexual activity for 1 week.  ° °You May Return to Work: in 1 week (if applicable) ° °Wound Care: You may wash cath site gently with soap and water. Keep cath site clean and dry. If you notice pain, swelling, bleeding or pus at your cath site, please call [phone]. ° ° ° °Cardiac Cath Site Care °Refer to this sheet in the next few weeks. These instructions provide you with information on caring for yourself after your procedure. Your caregiver may also give you more specific instructions. Your treatment has been planned according to current medical practices, but problems sometimes occur. Call your caregiver if you have any problems or questions after your procedure. °HOME CARE INSTRUCTIONS °· You may shower 24 hours after the procedure. Remove the bandage (dressing) and gently wash the site with plain soap and water. Gently pat the site dry.  °· Do not apply powder or lotion to the site.  °· Do not sit in a bathtub, swimming pool, or whirlpool for 5 to 7 days.  °· No bending, squatting, or lifting anything over 10 pounds (4.5 kg) as directed by your caregiver.  °· Inspect the site at least twice daily.  °· Do not drive home if you are discharged the same day of the procedure. Have someone else drive you.  °· You may drive 24 hours after the procedure unless otherwise instructed by your caregiver.  °What to expect: °· Any bruising will usually fade within 1 to 2 weeks.  °· Blood that collects in the tissue (hematoma) may be painful to the touch. It should usually decrease in size and tenderness within 1 to 2 weeks.  °SEEK IMMEDIATE MEDICAL CARE IF: °· You have unusual pain at the site or down the  affected limb.  °· You have redness, warmth, swelling, or pain at the site.  °· You have drainage (other than a small amount of blood on the dressing).  °· You have chills.  °· You have a fever or persistent symptoms for more than 72 hours.  °· You have a fever and your symptoms suddenly get worse.  °· Your leg becomes pale, cool, tingly, or numb.  °· You have heavy bleeding from the site. Hold pressure on the site.  °Document Released: 12/09/2010 Document Revised: 10/26/2011 Document Reviewed: 12/09/2010 °[employer]® Patient Information ©2012 [employer]. ° °

## 2015-02-12 NOTE — Discharge Summary (Addendum)
CARDIOLOGY DISCHARGE SUMMARY   Patient ID: Anthony Carpenter MRN: [id number] DOB/AGE: 1941/02/12 74 y.o.  Admit date: 02/11/2015 Discharge date: 02/12/2015  PCP: Lenora Boys, MD Primary Cardiologist: Dr. Elease Hashimoto  Primary Discharge Diagnosis:  Unstable angina Secondary Discharge Diagnosis:  Hypertension Sinus bradycardia  Consults: Cardiac rehabilitation  Procedures: 02/11/2015 1. Left Heart Catheterization 2. Selective Coronary Angiography 3. Left ventricular angiogram 4. PTCA/DES x 1 mid LAD  Hospital Course: Anthony Carpenter is a 74 y.o. male with a history of CAD. He was seen in the office by Dr. Elease Hashimoto for intermittent chest pain with exertion. His symptoms seem to be progressive and there was concern for unstable anginal pain. He was scheduled for cardiac catheterization and came to the hospital for the procedure on 02/11/2015.  Cardiac catheterization results are below. He had significant in-stent restenosis to the mid LAD which was treated with PTCA and a drug-eluting stent. He tolerated the procedure well.  On 02/12/2015, he was seen by Dr. Eldridge Dace and all data were reviewed. He was in sinus bradycardia, with a heart rate that dropped into the 40s at times. He is not on any rate lowering medications and he is asymptomatic with these heart rates. No further workup or intervention is indicated for this. He was seen by cardiac rehabilitation and ambulated without chest pain or shortness of breath. His Site was stable. No further inpatient workup was indicated and he is considered stable for discharge, to follow up as an outpatient.  Labs:   Lab Results  Component Value Date   WBC 7.6 02/12/2015   HGB 13.9 02/12/2015   HCT 41.8 02/12/2015   MCV 86.4 02/12/2015   PLT 141* 02/12/2015     Recent Labs Lab 02/12/15 0343  NA 137  K 3.6  CL 109  CO2 24  BUN 12  CREATININE 0.90  CALCIUM 8.7  GLUCOSE 98    Recent Labs  02/11/15 1100  CKTOTAL 42    Cardiac  Cath: 02/11/2015 Hemodynamic Findings: Central aortic pressure: 92/47 Left ventricular pressure: 95/5/16 Angiographic Findings: Left main: No obstructive disease.  Left Anterior Descending Artery: Large caliber vessel that courses to the apex. The mid segment is stented. The old stent has 99% restenosis. The distal vessel is free of obstructive disease. The Diagonal branch is moderate in caliber with ostial 30% stenosis where the vessel is jailed by the LAD stent.  Circumflex Artery: Large caliber vessel with moderate caliber obtuse marginal branch. The obtuse marginal branch has proximal 50% stenosis.  Right Coronary Artery: Large dominant vessel with 30% mid stenosis.  Left Ventricular Angiogram: LVEF=45-50% with hypokinesis of the anterior wall and apex.  PCI Note: He was given an additional 5000 units IV heparin. ACT was 288. He was given Plavix 600 mg po x 1. I then engaged the left main with a XB LAD 3.5 guiding catheter. I passed a Cougar IC wire down the LAD. A 2.5 x 12 mm balloon was used to pre-dilate the mid LAD stenosis within the old stent. I then deployed a 3.0 x 16 mm Promus Premier DES in the mid LAD. The stent was post-dilated with a 3.25 x 12 mm Harts balloon x 1. The stenosis was taken from 99% down to 0%.  Impression: 1. Severe single vessel CAD 2. Unstable angina secondary to severe mid LAD restenotic lesion 3. Mild segmental LV systolic dysfunction 4. Successful PTCA/DES x 1 mid LAD Recommendations: He will need DAPT with ASA and Plavix for one  year. Continue statin and beta blocker.   EKG: Sinus bradycardia with first-degree AV block, heart rates 40s at times  FOLLOW UP PLANS AND APPOINTMENTS No Known Allergies   Medication List    TAKE these medications        aspirin EC 81 MG tablet  Take 81 mg by mouth daily.     atorvastatin 80 MG tablet  Commonly known as:  LIPITOR  Take 1 tablet (80 mg total) by mouth daily.     clopidogrel 75 MG tablet  Commonly known  as:  PLAVIX  Take 1 tablet (75 mg total) by mouth daily with breakfast.     dorzolamide-timolol 22.3-6.8 MG/ML ophthalmic solution  Commonly known as:  COSOPT  Place 1 drop into both eyes 2 (two) times daily.     ICAPS AREDS FORMULA PO  Take by mouth daily.     isosorbide mononitrate 30 MG 24 hr tablet  Commonly known as:  IMDUR  Take 1 tablet (30 mg total) by mouth daily.     latanoprost 0.005 % ophthalmic solution  Commonly known as:  XALATAN  Place 1 drop into both eyes at bedtime.     lisinopril 10 MG tablet  Commonly known as:  PRINIVIL,ZESTRIL  Take 1 tablet (10 mg total) by mouth daily.     multivitamin with minerals Tabs tablet  Take 1 tablet by mouth daily.     nitroGLYCERIN 0.4 MG SL tablet  Commonly known as:  NITROSTAT  Place 1 tablet (0.4 mg total) under the tongue every 5 (five) minutes as needed. For chest pain.     omega-3 acid ethyl esters 1 G capsule  Commonly known as:  LOVAZA  Take 2 capsules (2 g total) by mouth daily.        Discharge Instructions    Amb Referral to Cardiac Rehabilitation    Complete by:  As directed      Diet - low sodium heart healthy    Complete by:  As directed      Increase activity slowly    Complete by:  As directed           Follow-up Information    Follow up with Nahser, Deloris PingPhilip J, MD.   Specialty:  Cardiology   Why:  Keep April appointment.   Contact information:   [address] [place] [address] [phone]       BRING ALL MEDICATIONS WITH YOU TO FOLLOW UP APPOINTMENTS  Time spent with patient to include physician time: 41 min Signed: Theodore Demarkhonda Barrett, PA-C 02/12/2015, 11:02 AM Co-Sign MD   I have examined the patient and reviewed assessment and plan and discussed with patient. Agree with above as stated. Doing well post PCI. COntinue isosorbide for now. Can stop as outpatient if he is doing well. Went over importance of DAPT without interruption. Atorvastatin for  hyperlipidemia.  Enio Hornback S.

## 2015-02-12 NOTE — Progress Notes (Signed)
     Patient Name: Anthony JonesJohn R Bicking Date of Encounter: 02/12/2015  Active Problems:   Unstable angina   Primary Cardiologist: Nahser  Patient Profile: 74 yo male w/ hx CAD, HLD, Ao bicuspid valve, seen in ofc w/ exertional angina. Came in for cath 03/24  SUBJECTIVE: No chest pain or SOB  OBJECTIVE Filed Vitals:   02/11/15 2000 02/11/15 2004 02/12/15 0045 02/12/15 0401  BP:  109/53 104/52 102/45  Pulse: 56 54 57 60  Temp:  97.9 F (36.6 C) 97.2 F (36.2 C) 97.8 F (36.6 C)  TempSrc:  Oral Oral Oral  Resp: 19 17 19 18   Height:      Weight:    210 lb 1.6 oz (95.3 kg)  SpO2: 97% 97% 100% 100%    Intake/Output Summary (Last 24 hours) at 02/12/15 0730 Last data filed at 02/11/15 2007  Gross per 24 hour  Intake    360 ml  Output    850 ml  Net   -490 ml   Filed Weights   02/11/15 0836 02/12/15 0401  Weight: 212 lb (96.163 kg) 210 lb 1.6 oz (95.3 kg)    PHYSICAL EXAM General: Well developed, well nourished, male in no acute distress. Head: Normocephalic, atraumatic.  Neck: Supple without bruits, JVD not elevated. Lungs:  Resp regular and unlabored, CTA. Heart: RRR, S1, S2, no S3, S4, or murmur; no rub. Abdomen: Soft, non-tender, non-distended, BS + x 4.  Extremities: No clubbing, cyanosis, no edema. Right radial cath site without ecchymosis or hematoma Neuro: Alert and oriented X 3. Moves all extremities spontaneously. Psych: Normal affect.  LABS: CBC: Recent Labs  02/12/15 0343  WBC 7.6  HGB 13.9  HCT 41.8  MCV 86.4  PLT 141*   Basic Metabolic Panel: Recent Labs  02/12/15 0343  NA 137  K 3.6  CL 109  CO2 24  GLUCOSE 98  BUN 12  CREATININE 0.90  CALCIUM 8.7   Cardiac Enzymes: Recent Labs  02/11/15 1100  CKTOTAL 42   TELE:  Sinus rhythm, frequently sinus bradycardia, first-degree AV block  Current Medications:  . aspirin EC  81 mg Oral Daily  . atorvastatin  80 mg Oral Daily  . clopidogrel  75 mg Oral Q breakfast  . dorzolamide-timolol  1  drop Both Eyes BID  . isosorbide mononitrate  30 mg Oral Daily  . latanoprost  1 drop Both Eyes QHS  . lisinopril  10 mg Oral Daily      ASSESSMENT AND PLAN: Active Problems:   Unstable angina s/p DES mLAD for in-stent restenosis EF 45-50 percent No other obstructive disease, jailed diagonal branch has 30% stenosis. M.D. advise on continuing Imdur 30 mg daily. Otherwise, continue aspirin, statin, Plavix, lisinopril.   Sinus bradycardia Asymptomatic with a heart rate in the high 40s and low 50s, not on any rate-lowering medications, follow  Plan, cardiac rehabilitation to see, discharge today. He requests a new prescription for nitroglycerin, will arrange  Signed, Theodore DemarkRhonda Barrett , PA-C 7:30 AM 02/12/2015  I have examined the patient and reviewed assessment and plan and discussed with patient.  Agree with above as stated.  Doing well post PCI.  COntinue isosorbide for now.  Can stop as outpatient if he is doing well. Went over importance of DAPT without interruption.  Mainor Hellmann S.

## 2015-02-15 ENCOUNTER — Telehealth: Payer: Self-pay | Admitting: Cardiovascular Disease

## 2015-02-15 NOTE — Telephone Encounter (Signed)
Spoke with patient who states he has a cracked tooth and wants to know if he will need antibiotics prior to having dental work.  Patient had stent placed last week.  I reviewed with Dr. Elease HashimotoNahser and he advised that since the stent is so new, that he would like for the patient to take prophylactic antibiotic prior to dental work.  I advised patient that he will not be able to stop Plavix at this point and to call the dentist to find out if he/she is willing to do work with patient on Plavix.  Patient verbalized understanding and agreement and states dental office is closed today; he will contact them tomorrow and if dental work will be done, he will notify me to send antibiotics to pharmacy.

## 2015-02-15 NOTE — Telephone Encounter (Signed)
New Msg ° ° ° ° ° ° ° °Pt calling, request call back.  ° ° °No details given.  °

## 2015-02-16 MED ORDER — AMOXICILLIN 500 MG PO CAPS: ORAL_CAPSULE | ORAL | Status: DC

## 2015-02-16 NOTE — Telephone Encounter (Signed)
Follow Up  Pt requested to speak w/ Elon JesterMichele. Please call back and discuss.

## 2015-02-16 NOTE — Telephone Encounter (Signed)
Spoke with patient who states he will have dental exam today for cracked tooth.  I advised that Dr. Elease HashimotoNahser does want patient to take prophylactic antibiotic therapy and sent Rx to patient's preferred pharmacy for Amoxicillin 2 g to be taken 1 hour before appointment.  Patient verbalized understanding and agreement.

## 2015-03-09 ENCOUNTER — Other Ambulatory Visit: Payer: Self-pay | Admitting: *Deleted

## 2015-03-09 DIAGNOSIS — E785 Hyperlipidemia, unspecified: Secondary | ICD-10-CM

## 2015-03-09 MED ORDER — OMEGA-3-ACID ETHYL ESTERS 1 G PO CAPS: 2.0000 g | ORAL_CAPSULE | Freq: Every day | ORAL | Status: DC

## 2015-03-11 ENCOUNTER — Encounter (HOSPITAL_COMMUNITY)
Admission: RE | Admit: 2015-03-11 | Discharge: 2015-03-11 | Disposition: A | Discharge status: Home / Self Care | Payer: Medicare Other | Source: Ambulatory Visit | Attending: Cardiovascular Disease | Admitting: Cardiovascular Disease

## 2015-03-11 DIAGNOSIS — Z48812 Encounter for surgical aftercare following surgery on the circulatory system: Secondary | ICD-10-CM | POA: Insufficient documentation

## 2015-03-11 DIAGNOSIS — Z955 Presence of coronary angioplasty implant and graft: Secondary | ICD-10-CM | POA: Insufficient documentation

## 2015-03-11 NOTE — Progress Notes (Signed)
Cardiac Rehab Medication Review by a Pharmacist  Does the patient  feel that his/her medications are working for him/her?  yes  Has the patient been experiencing any side effects to the medications prescribed?  no  Does the patient measure his/her own blood pressure or blood glucose at home?  no   Does the patient have any problems obtaining medications due to transportation or finances?   no  Understanding of regimen: excellent Understanding of indications: excellent Potential of compliance: excellent    Pharmacist comments: Patient had an excellent understanding of his medications.  He states that he feels his medications are working for him.    Anthony [name], PharmD Clinical Pharmacist - Resident Pager: [phone] 4/21/20168:47 AM

## 2015-03-12 ENCOUNTER — Other Ambulatory Visit: Payer: Self-pay | Admitting: *Deleted

## 2015-03-12 MED ORDER — CLOPIDOGREL BISULFATE 75 MG PO TABS: 75.0000 mg | ORAL_TABLET | Freq: Every day | ORAL | Status: DC

## 2015-03-15 ENCOUNTER — Encounter (HOSPITAL_COMMUNITY): Payer: Self-pay

## 2015-03-15 ENCOUNTER — Encounter (HOSPITAL_COMMUNITY)
Admission: RE | Admit: 2015-03-15 | Discharge: 2015-03-15 | Disposition: A | Discharge status: Home / Self Care | Payer: Medicare Other | Source: Ambulatory Visit | Attending: Cardiovascular Disease | Admitting: Cardiovascular Disease

## 2015-03-15 ENCOUNTER — Encounter: Payer: Self-pay | Admitting: Cardiovascular Disease

## 2015-03-15 DIAGNOSIS — Z48812 Encounter for surgical aftercare following surgery on the circulatory system: Secondary | ICD-10-CM | POA: Diagnosis not present

## 2015-03-15 DIAGNOSIS — Z955 Presence of coronary angioplasty implant and graft: Secondary | ICD-10-CM | POA: Diagnosis not present

## 2015-03-15 NOTE — Progress Notes (Signed)
Pt started cardiac rehab today.  Pt tolerated light exercise without difficulty. VSS, telemetry-sinus rhythm, first degree AV block, occ PAC and PVC, asymptomatic.  Medication list reconciled.  Pt verbalized compliance with medications and denies barriers to compliance. PHQ-0. Pt exhibits positive coping skills, hopeful outlook with supportive family. No psychosocial needs identified at this time, no psychosocial interventions necessary.    Pt enjoys gardening, fishing, and hunting.   Pt cardiac rehab short term goal is  to decrease his dyspnea on exertion.    Pt long term cardiac rehab goal is to improve strength and stamina.  Pt encouraged regular participation in cardiac rehab scheduled activities as well as exercising on his own will increase his ability to achieve these goals. Pt has history of CAD therefore is familiar with risk factor reduction however he is encouraged to participate in lifestyle modification education classes available to enhance his ability to decrease his risk factors and improve his lifestyle.    Pt oriented to exercise equipment and routine.  Understanding verbalized.

## 2015-03-17 ENCOUNTER — Encounter (HOSPITAL_COMMUNITY)
Admission: RE | Admit: 2015-03-17 | Discharge: 2015-03-17 | Disposition: A | Discharge status: Home / Self Care | Payer: Medicare Other | Source: Ambulatory Visit | Attending: Cardiovascular Disease | Admitting: Cardiovascular Disease

## 2015-03-17 ENCOUNTER — Ambulatory Visit (INDEPENDENT_AMBULATORY_CARE_PROVIDER_SITE_OTHER): Payer: Medicare Other | Admitting: Cardiovascular Disease

## 2015-03-17 ENCOUNTER — Encounter: Payer: Self-pay | Admitting: Cardiovascular Disease

## 2015-03-17 VITALS — BP 116/60 | HR 56 | Ht 70.5 in | Wt 212.6 lb

## 2015-03-17 DIAGNOSIS — E785 Hyperlipidemia, unspecified: Secondary | ICD-10-CM

## 2015-03-17 DIAGNOSIS — I2510 Atherosclerotic heart disease of native coronary artery without angina pectoris: Secondary | ICD-10-CM

## 2015-03-17 DIAGNOSIS — I10 Essential (primary) hypertension: Secondary | ICD-10-CM

## 2015-03-17 DIAGNOSIS — Z48812 Encounter for surgical aftercare following surgery on the circulatory system: Secondary | ICD-10-CM | POA: Diagnosis not present

## 2015-03-17 NOTE — Progress Notes (Signed)
QUALITY OF LIFE SCORE REVIEW Patient quality of life overall very good.  Pt has no concerns or limitations. Pt is troubled today by the death of his neighbor in MVA yesterday.    Offered emotional support and reassurance.  No needs identified, no intervention indicated.  Will continue to monitor and intervene as necessary.

## 2015-03-17 NOTE — Patient Instructions (Signed)
Medication Instructions:  Your physician recommends that you continue on your current medications as directed. Please refer to the Current Medication list given to you today.   Labwork: Your physician recommends that you return for lab work (cholesterol, liver, bmet) in: 6 months on the day of or a few days before your office visit with Dr. Elease HashimotoNahser.  You will need to FAST for this appointment - nothing to eat or drink after midnight the night before except water.   Testing/Procedures: None  Follow-Up: Your physician wants you to follow-up in: 6 months with Dr. Elease HashimotoNahser.  You will receive a reminder letter in the mail two months in advance. If you don't receive a letter, please call our office to schedule the follow-up appointment.

## 2015-03-17 NOTE — Progress Notes (Signed)
Cardiology Office Note   Date:  03/17/2015   ID:  Anthony Carpenter, DOB Dec 15, 1940, MRN [id number]  PCP:  Lenora Boys, MD  Cardiologist:   Vesta Mixer, MD   Chief Complaint  Patient presents with  . Coronary Artery Disease    1. Coronary artery disease-status post PTCA and stenting of his mid LAD. We placed a 2.75 x 20 mm Taxus stent. It was post dilated using a 3.0 Quantum Ma[place]ick, 07/28/05  2. Hyperlipidemia-   3. Possible bicuspid aortic valve   History of Present Illness:  Anthony Carpenter is doing well. No chest pain or dyspnea. He plans on putting in a garden this summer . He's building his house this year. He was admitted to the hospital with some chest pain/abdominal pain. A Myoview study did not reveal any ischemia.  He has felt better. No further episodes of chest pain. He forgot to take his meds this am.  Feb. 26, 2014: Anthony Carpenter is doing well. He has not had any angina. He remains active - still building houses.   July 14, 2013:  Anthony Carpenter is doing Well. No angina.   July 14, 2014:  Anthony Carpenter is doing well. Still working No CP, no dyspnea.   Dec. 11, 2015;  Anthony Carpenter was in the hospital for CP recently. He was having vague chest discomfort. Cath showed a 60 % instent restenosis. Pains have resolved. He is back doing all of his normal activities. BP has been lower   February 05, 2015:  Anthony Carpenter is a 74 y.o. male who presents for some CP and DOE for the past month. 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 Will last as long as he's working . Eases up if he stops.   March 17, 2015:  Anthony Carpenter is a 74 y.o. male who presents for follow up of an episode of unstable angina.  I saw him in March for symptoms worisome of UAP.  CAth showed.   Left main: No obstructive disease.   Left Anterior Descending Artery: Large caliber vessel that courses to the apex. The mid segment is stented. The old stent has 99% restenosis. The  distal vessel is free of obstructive disease. The Diagonal branch is moderate in caliber with ostial 30% stenosis where the vessel is jailed by the LAD stent.  Circumflex Artery: Large caliber vessel with moderate caliber obtuse marginal branch. The obtuse marginal branch has proximal 50% stenosis.  Right Coronary Artery: Large dominant vessel with 30% mid stenosis.  Left Ventricular Angiogram: LVEF=45-50% with hypokinesis of the anterior wall and apex.  He had 3.0 x 16 mm Promus Premier DES in the mid LAD. The stent was post-dilated with a 3.25 x 12 mm [place] balloon x 1. The stenosis was taken from 99% down to 0%.   He is feeling much better.   Not shortness of breath   Participating in cardiac rehab. Bruising from the plavix.   But no real    Past Medical History  Diagnosis Date  . Hyperlipidemia   . Coronary artery disease     a. CAD s/p PTCA/Taxus stent to mid-prox LAD 2006  b. cath 10/02/14 with 60% focal instent restenosis in LAD prox to the Diag takeoff, 30% mid AV groove left circ stenosis and luminal irregularities in a large dominant RCA. FFR of LAD was 0.85 and not felt to be HD significant to warrant PCI.  Marland Kitchen Bradycardia   . Bicuspid aortic valve   .  Aortic stenosis, mild   . Carotid bruit present     a. Right side  . Pulmonary nodule seen on imaging study     a. seen on CT during 09/2014 admission, may require follow up imaging   . Myocardial infarction 2006  . Hypertension     Past Surgical History  Procedure Laterality Date  . Cardiac catheterization  07/28/2005    Est EF of 55% -- Single-vessel obstructive disease in the proximal/mid left anterior descending -- Preserved left ventricular systolic function with good anterior wall motion  . Coronary angioplasty with stent placement  07/28/2005    Successful PTCA and stenting of the mid and proximal left anterior descending artery.  The patient is stable leaving the catheterization laboratory -- Vesta MixerPhilip J. Sadira Standard, M.D.      . Cataracts    . Left heart catheterization with coronary angiogram N/A 10/02/2014    Procedure: LEFT HEART CATHETERIZATION WITH CORONARY ANGIOGRAM;  Surgeon: Lennette Biharihomas A Kelly, MD;  [facility];  Service: Cardiovascular;  Laterality: N/A;  . Coronary stent placement  02/11/2015    3.0 x 16 mm Promus Premier DES to the mLAD for ISR  . Colonoscopy  ? 2009  . Left heart catheterization with coronary angiogram N/A 02/11/2015    Procedure: LEFT HEART CATHETERIZATION WITH CORONARY ANGIOGRAM;  Surgeon: Kathleene Hazelhristopher D McAlhany, MD; LAD 99% ISR, CFX okay, OM1 50%, RCA 30%, D1 30% (jailed by the LAD stent), EF 45-50 %     Current Outpatient Prescriptions  Medication Sig Dispense Refill  . amoxicillin (AMOXIL) 500 MG capsule Take 4 caps 1 hour before dental procedure 4 capsule 0  . aspirin EC 81 MG tablet Take 81 mg by mouth daily.    Marland Kitchen. atorvastatin (LIPITOR) 80 MG tablet Take 1 tablet (80 mg total) by mouth daily. 90 tablet 3  . clopidogrel (PLAVIX) 75 MG tablet Take 1 tablet (75 mg total) by mouth daily with breakfast. 90 tablet 3  . dorzolamide-timolol (COSOPT) 22.3-6.8 MG/ML ophthalmic solution Place 1 drop into both eyes 2 (two) times daily.     . isosorbide mononitrate (IMDUR) 30 MG 24 hr tablet Take 1 tablet (30 mg total) by mouth daily. 90 tablet 3  . latanoprost (XALATAN) 0.005 % ophthalmic solution Place 1 drop into both eyes at bedtime.     Marland Kitchen. lisinopril (PRINIVIL,ZESTRIL) 10 MG tablet Take 1 tablet (10 mg total) by mouth daily. 90 tablet 2  . Multiple Vitamin (MULITIVITAMIN WITH MINERALS) TABS Take 1 tablet by mouth daily.    . Multiple Vitamins-Minerals (ICAPS AREDS FORMULA PO) Take by mouth daily. Patient reports taking 2 tablets daily    . nitroGLYCERIN (NITROSTAT) 0.4 MG SL tablet Place 1 tablet (0.4 mg total) under the tongue every 5 (five) minutes as needed. For chest pain. 25 tablet 8  . omega-3 acid ethyl esters (LOVAZA) 1 G capsule Take 2 capsules (2 g total) by mouth daily.  180 capsule 3   No current facility-administered medications for this visit.    Allergies:   Review of patient's allergies indicates no known allergies.    Social History:  The patient  reports that he has never smoked. He has never used smokeless tobacco. He reports that he does not drink alcohol or use illicit drugs.   Family History:  The patient's family history includes Cancer in an other family member; Lung cancer in his father. There is no history of Heart disease.    ROS:  Please see the history of  present illness.    Review of Systems: Constitutional:  denies fever, chills, diaphoresis, appetite change and fatigue.  HEENT: denies photophobia, eye pain, redness, hearing loss, ear pain, congestion, sore throat, rhinorrhea, sneezing, neck pain, neck stiffness and tinnitus.  Respiratory: denies SOB, DOE, cough, chest tightness, and wheezing.  Cardiovascular: denies chest pain, palpitations and leg swelling.  Gastrointestinal: denies nausea, vomiting, abdominal pain, diarrhea, constipation, blood in stool.  Genitourinary: denies dysuria, urgency, frequency, hematuria, flank pain and difficulty urinating.  Musculoskeletal: denies  myalgias, back pain, joint swelling, arthralgias and gait problem.   Skin: denies pallor, rash and wound.  Neurological: denies dizziness, seizures, syncope, weakness, light-headedness, numbness and headaches.   Hematological: denies adenopathy, easy bruising, personal or family bleeding history.  Psychiatric/ Behavioral: denies suicidal ideation, mood changes, confusion, nervousness, sleep disturbance and agitation.       All other systems are reviewed and negative.    PHYSICAL EXAM: VS:  BP 116/60 mmHg  Pulse 56  Ht 5' 10.5" (1.791 m)  Wt 212 lb 9.6 oz (96.435 kg)  BMI 30.06 kg/m2 , BMI Body mass index is 30.06 kg/(m^2). GEN: Well nourished, well developed, in no acute distress HEENT: normal Neck: no JVD, carotid bruits, or masses Cardiac:  RRR; no murmurs, rubs, or gallops,no edema  Respiratory:  clear to auscultation bilaterally, normal work of breathing GI: soft, nontender, nondistended, + BS MS: no deformity or atrophy, right radial cath site looks great , pulses are intact Skin: warm and dry, no rash Neuro:  Strength and sensation are intact Psych: normal   EKG:  EKG is not ordered today. The ekg ordered today demonstrates    Recent Labs: 10/01/2014: Pro B Natriuretic peptide (BNP) 241.6* 10/02/2014: ALT 42 02/12/2015: BUN 12; Creatinine 0.90; Hemoglobin 13.9; Platelets 141*; Potassium 3.6; Sodium 137    Lipid Panel    Component Value Date/Time   CHOL 128 07/14/2014 1008   TRIG 65.0 07/14/2014 1008   HDL 37.90* 07/14/2014 1008   CHOLHDL 3 07/14/2014 1008   VLDL 13.0 07/14/2014 1008   LDLCALC 77 07/14/2014 1008      Wt Readings from Last 3 Encounters:  03/17/15 212 lb 9.6 oz (96.435 kg)  03/11/15 212 lb 1.3 oz (96.2 kg)  02/12/15 210 lb 1.6 oz (95.3 kg)      Other studies Reviewed: Additional studies/ records that were reviewed today include: . Review of the above records demonstrates:    ASSESSMENT AND PLAN:   1. Coronary artery disease-status post PTCA and stenting of his mid LAD. We placed a 2.75 x 20 mm Taxus stent,  Now with a 3.0 x 16 mm Promus Premier DES in the mid LAD. The stent was post-dilated with a 3.25 x 12 mm [place] balloon.   he's doing very well. He's not had any episodes of chest pain.  continue same medications.  2. Hyperlipidemia-  his labs at his medical doctor's office looked okay recent. We'll see her again in 6 months and we'll check fasting labs at that time.  3. Possible bicuspid aortic valve-    Current medicines are reviewed at length with the patient today.  The patient does not have concerns regarding medicines.  The following changes have been made:  no change  Labs/ tests ordered today include:  No orders of the defined types were placed in this encounter.      Disposition:   FU with me in 6 months      Signed, Jonnelle Lawniczak, Deloris Ping, MD  03/17/2015 12:06  PM    [facility] [address], [place], [place]  [address] Phone: [phone]; Fax: [phone]

## 2015-03-19 ENCOUNTER — Encounter (HOSPITAL_COMMUNITY)
Admission: RE | Admit: 2015-03-19 | Discharge: 2015-03-19 | Disposition: A | Discharge status: Home / Self Care | Payer: Medicare Other | Source: Ambulatory Visit | Attending: Cardiovascular Disease | Admitting: Cardiovascular Disease

## 2015-03-19 DIAGNOSIS — Z48812 Encounter for surgical aftercare following surgery on the circulatory system: Secondary | ICD-10-CM | POA: Diagnosis not present

## 2015-03-22 ENCOUNTER — Encounter (HOSPITAL_COMMUNITY)
Admission: RE | Admit: 2015-03-22 | Discharge: 2015-03-22 | Disposition: A | Discharge status: Home / Self Care | Payer: Medicare Other | Source: Ambulatory Visit | Attending: Cardiovascular Disease | Admitting: Cardiovascular Disease

## 2015-03-22 DIAGNOSIS — Z48812 Encounter for surgical aftercare following surgery on the circulatory system: Secondary | ICD-10-CM | POA: Diagnosis present

## 2015-03-22 DIAGNOSIS — Z955 Presence of coronary angioplasty implant and graft: Secondary | ICD-10-CM | POA: Insufficient documentation

## 2015-03-22 NOTE — Progress Notes (Signed)
Reviewed home exercise guidelines with patient including endpoints, temperature precautions, target heart rate and rate of perceived exertion. Pt plans to walk and use a stationary bike as his mode of home exercise. Pt voices understanding of instructions given.  Worth Debby FreibergLee Griswold Academic Intern  Artist Paislinty M Keysi Oelkers, MS, ACSM CCEP

## 2015-03-24 ENCOUNTER — Encounter (HOSPITAL_COMMUNITY)
Admission: RE | Admit: 2015-03-24 | Discharge: 2015-03-24 | Disposition: A | Discharge status: Home / Self Care | Payer: Medicare Other | Source: Ambulatory Visit | Attending: Cardiovascular Disease | Admitting: Cardiovascular Disease

## 2015-03-24 DIAGNOSIS — Z48812 Encounter for surgical aftercare following surgery on the circulatory system: Secondary | ICD-10-CM | POA: Diagnosis not present

## 2015-03-26 ENCOUNTER — Encounter (HOSPITAL_COMMUNITY)
Admission: RE | Admit: 2015-03-26 | Discharge: 2015-03-26 | Disposition: A | Discharge status: Home / Self Care | Payer: Medicare Other | Source: Ambulatory Visit | Attending: Cardiovascular Disease | Admitting: Cardiovascular Disease

## 2015-03-26 DIAGNOSIS — Z48812 Encounter for surgical aftercare following surgery on the circulatory system: Secondary | ICD-10-CM | POA: Diagnosis not present

## 2015-03-29 ENCOUNTER — Encounter (HOSPITAL_COMMUNITY)
Admission: RE | Admit: 2015-03-29 | Discharge: 2015-03-29 | Disposition: A | Discharge status: Home / Self Care | Payer: Medicare Other | Source: Ambulatory Visit | Attending: Cardiovascular Disease | Admitting: Cardiovascular Disease

## 2015-03-29 DIAGNOSIS — Z48812 Encounter for surgical aftercare following surgery on the circulatory system: Secondary | ICD-10-CM | POA: Diagnosis not present

## 2015-03-31 ENCOUNTER — Encounter (HOSPITAL_COMMUNITY)
Admission: RE | Admit: 2015-03-31 | Discharge: 2015-03-31 | Disposition: A | Discharge status: Home / Self Care | Payer: Medicare Other | Source: Ambulatory Visit | Attending: Cardiovascular Disease | Admitting: Cardiovascular Disease

## 2015-03-31 DIAGNOSIS — Z48812 Encounter for surgical aftercare following surgery on the circulatory system: Secondary | ICD-10-CM | POA: Diagnosis not present

## 2015-04-02 ENCOUNTER — Encounter (HOSPITAL_COMMUNITY)
Admission: RE | Admit: 2015-04-02 | Discharge: 2015-04-02 | Disposition: A | Discharge status: Home / Self Care | Payer: Medicare Other | Source: Ambulatory Visit | Attending: Cardiovascular Disease | Admitting: Cardiovascular Disease

## 2015-04-02 DIAGNOSIS — Z48812 Encounter for surgical aftercare following surgery on the circulatory system: Secondary | ICD-10-CM | POA: Diagnosis not present

## 2015-04-05 ENCOUNTER — Encounter (HOSPITAL_COMMUNITY)
Admission: RE | Admit: 2015-04-05 | Discharge: 2015-04-05 | Disposition: A | Discharge status: Home / Self Care | Payer: Medicare Other | Source: Ambulatory Visit | Attending: Cardiovascular Disease | Admitting: Cardiovascular Disease

## 2015-04-05 DIAGNOSIS — Z48812 Encounter for surgical aftercare following surgery on the circulatory system: Secondary | ICD-10-CM | POA: Diagnosis not present

## 2015-04-05 NOTE — Progress Notes (Signed)
30 day psychosocial reassessment  No psychosocial needs identified, no interventions necessary.

## 2015-04-07 ENCOUNTER — Encounter (HOSPITAL_COMMUNITY)
Admission: RE | Admit: 2015-04-07 | Discharge: 2015-04-07 | Disposition: A | Discharge status: Home / Self Care | Payer: Medicare Other | Source: Ambulatory Visit | Attending: Cardiovascular Disease | Admitting: Cardiovascular Disease

## 2015-04-07 DIAGNOSIS — Z48812 Encounter for surgical aftercare following surgery on the circulatory system: Secondary | ICD-10-CM | POA: Diagnosis not present

## 2015-04-07 NOTE — Progress Notes (Signed)
Aram CandelaJohn R Delman 74 y.o. male Nutrition Note Spoke with pt.  Nutrition Survey reviewed with pt. Pt is following Step 2 of the Therapeutic Lifestyle Changes diet according to [employer] food survey. Pt eats out "about 10 times a week mostly for lunch because I work." Alternatives to eating out every day discussed.  Pt wants to lose wt. Pt has been trying to lose wt by decreasing portion sizes. Wt loss tips reviewed. Pt expressed understanding of the information reviewed. Pt aware of nutrition education classes offered and plans on attending nutrition classes. No results found for: HGBA1C   Wt Readings from Last 3 Encounters:  03/17/15 212 lb 9.6 oz (96.435 kg)  03/11/15 212 lb 1.3 oz (96.2 kg)  02/12/15 210 lb 1.6 oz (95.3 kg)   Nutrition Diagnosis ? Food-and nutrition-related knowledge deficit related to lack of exposure to information as related to diagnosis of: ? CVD ?  ? 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 Benefits of adopting Therapeutic Lifestyle Changes discussed when [employer] reviewed. ? Pt to attend the Portion Distortion class ? Pt to attend the  ? Nutrition I class                     ? Nutrition II class ? Continue client-centered nutrition education by RD, as part of interdisciplinary care.  Goal(s) ? Pt to make good choices when eating out at restaurants. ? Pt to identify food quantities necessary to achieve: ? wt loss to a goal wt of 188-206 lb (85.3-93.5 kg) at graduation from cardiac rehab.  ? Pt to describe the benefit of including fruits, vegetables, whole grains, and low-fat dairy products in a heart healthy meal plan.  Monitor and Evaluate progress toward nutrition goal with team.  Mickle PlumbEdna Quency Tober, M.Ed, RD, LDN, CDE 04/07/2015 9:25 AM

## 2015-04-09 ENCOUNTER — Encounter (HOSPITAL_COMMUNITY): Payer: Medicare Other

## 2015-04-12 ENCOUNTER — Encounter (HOSPITAL_COMMUNITY)
Admission: RE | Admit: 2015-04-12 | Discharge: 2015-04-12 | Disposition: A | Discharge status: Home / Self Care | Payer: Medicare Other | Source: Ambulatory Visit | Attending: Cardiovascular Disease | Admitting: Cardiovascular Disease

## 2015-04-12 DIAGNOSIS — Z48812 Encounter for surgical aftercare following surgery on the circulatory system: Secondary | ICD-10-CM | POA: Diagnosis not present

## 2015-04-14 ENCOUNTER — Encounter (HOSPITAL_COMMUNITY)
Admission: RE | Admit: 2015-04-14 | Discharge: 2015-04-14 | Disposition: A | Discharge status: Home / Self Care | Payer: Medicare Other | Source: Ambulatory Visit | Attending: Cardiovascular Disease | Admitting: Cardiovascular Disease

## 2015-04-14 DIAGNOSIS — Z48812 Encounter for surgical aftercare following surgery on the circulatory system: Secondary | ICD-10-CM | POA: Diagnosis not present

## 2015-04-16 ENCOUNTER — Encounter (HOSPITAL_COMMUNITY)
Admission: RE | Admit: 2015-04-16 | Discharge: 2015-04-16 | Disposition: A | Discharge status: Home / Self Care | Payer: Medicare Other | Source: Ambulatory Visit | Attending: Cardiovascular Disease | Admitting: Cardiovascular Disease

## 2015-04-16 DIAGNOSIS — Z48812 Encounter for surgical aftercare following surgery on the circulatory system: Secondary | ICD-10-CM | POA: Diagnosis not present

## 2015-04-21 ENCOUNTER — Encounter (HOSPITAL_COMMUNITY)
Admission: RE | Admit: 2015-04-21 | Discharge: 2015-04-21 | Disposition: A | Discharge status: Home / Self Care | Payer: Medicare Other | Source: Ambulatory Visit | Attending: Cardiovascular Disease | Admitting: Cardiovascular Disease

## 2015-04-21 DIAGNOSIS — Z955 Presence of coronary angioplasty implant and graft: Secondary | ICD-10-CM | POA: Diagnosis not present

## 2015-04-21 DIAGNOSIS — Z48812 Encounter for surgical aftercare following surgery on the circulatory system: Secondary | ICD-10-CM | POA: Diagnosis not present

## 2015-04-23 ENCOUNTER — Encounter (HOSPITAL_COMMUNITY)
Admission: RE | Admit: 2015-04-23 | Discharge: 2015-04-23 | Disposition: A | Discharge status: Home / Self Care | Payer: Medicare Other | Source: Ambulatory Visit | Attending: Cardiovascular Disease | Admitting: Cardiovascular Disease

## 2015-04-23 DIAGNOSIS — Z48812 Encounter for surgical aftercare following surgery on the circulatory system: Secondary | ICD-10-CM | POA: Diagnosis not present

## 2015-04-26 ENCOUNTER — Encounter (HOSPITAL_COMMUNITY)
Admission: RE | Admit: 2015-04-26 | Discharge: 2015-04-26 | Disposition: A | Discharge status: Home / Self Care | Payer: Medicare Other | Source: Ambulatory Visit | Attending: Cardiovascular Disease | Admitting: Cardiovascular Disease

## 2015-04-26 DIAGNOSIS — Z48812 Encounter for surgical aftercare following surgery on the circulatory system: Secondary | ICD-10-CM | POA: Diagnosis not present

## 2015-04-28 ENCOUNTER — Encounter (HOSPITAL_COMMUNITY)
Admission: RE | Admit: 2015-04-28 | Discharge: 2015-04-28 | Disposition: A | Discharge status: Home / Self Care | Payer: Medicare Other | Source: Ambulatory Visit | Attending: Cardiovascular Disease | Admitting: Cardiovascular Disease

## 2015-04-28 DIAGNOSIS — Z48812 Encounter for surgical aftercare following surgery on the circulatory system: Secondary | ICD-10-CM | POA: Diagnosis not present

## 2015-04-30 ENCOUNTER — Encounter (HOSPITAL_COMMUNITY)
Admission: RE | Admit: 2015-04-30 | Discharge: 2015-04-30 | Disposition: A | Discharge status: Home / Self Care | Payer: Medicare Other | Source: Ambulatory Visit | Attending: Cardiovascular Disease | Admitting: Cardiovascular Disease

## 2015-04-30 DIAGNOSIS — Z48812 Encounter for surgical aftercare following surgery on the circulatory system: Secondary | ICD-10-CM | POA: Diagnosis not present

## 2015-05-03 ENCOUNTER — Encounter (HOSPITAL_COMMUNITY)
Admission: RE | Admit: 2015-05-03 | Discharge: 2015-05-03 | Disposition: A | Discharge status: Home / Self Care | Payer: Medicare Other | Source: Ambulatory Visit | Attending: Cardiovascular Disease | Admitting: Cardiovascular Disease

## 2015-05-03 DIAGNOSIS — Z48812 Encounter for surgical aftercare following surgery on the circulatory system: Secondary | ICD-10-CM | POA: Diagnosis not present

## 2015-05-05 ENCOUNTER — Encounter (HOSPITAL_COMMUNITY)
Admission: RE | Admit: 2015-05-05 | Discharge: 2015-05-05 | Disposition: A | Discharge status: Home / Self Care | Payer: Medicare Other | Source: Ambulatory Visit | Attending: Cardiovascular Disease | Admitting: Cardiovascular Disease

## 2015-05-05 DIAGNOSIS — Z48812 Encounter for surgical aftercare following surgery on the circulatory system: Secondary | ICD-10-CM | POA: Diagnosis not present

## 2015-05-07 ENCOUNTER — Encounter (HOSPITAL_COMMUNITY)
Admission: RE | Admit: 2015-05-07 | Discharge: 2015-05-07 | Disposition: A | Discharge status: Home / Self Care | Payer: Medicare Other | Source: Ambulatory Visit | Attending: Cardiovascular Disease | Admitting: Cardiovascular Disease

## 2015-05-07 DIAGNOSIS — Z48812 Encounter for surgical aftercare following surgery on the circulatory system: Secondary | ICD-10-CM | POA: Diagnosis not present

## 2015-05-10 ENCOUNTER — Encounter (HOSPITAL_COMMUNITY)
Admission: RE | Admit: 2015-05-10 | Discharge: 2015-05-10 | Disposition: A | Discharge status: Home / Self Care | Payer: Medicare Other | Source: Ambulatory Visit | Attending: Cardiovascular Disease | Admitting: Cardiovascular Disease

## 2015-05-10 DIAGNOSIS — Z48812 Encounter for surgical aftercare following surgery on the circulatory system: Secondary | ICD-10-CM | POA: Diagnosis not present

## 2015-05-12 ENCOUNTER — Encounter (HOSPITAL_COMMUNITY)
Admission: RE | Admit: 2015-05-12 | Discharge: 2015-05-12 | Disposition: A | Discharge status: Home / Self Care | Payer: Medicare Other | Source: Ambulatory Visit | Attending: Cardiovascular Disease | Admitting: Cardiovascular Disease

## 2015-05-12 DIAGNOSIS — Z48812 Encounter for surgical aftercare following surgery on the circulatory system: Secondary | ICD-10-CM | POA: Diagnosis not present

## 2015-05-14 ENCOUNTER — Encounter (HOSPITAL_COMMUNITY)
Admission: RE | Admit: 2015-05-14 | Discharge: 2015-05-14 | Disposition: A | Discharge status: Home / Self Care | Payer: Medicare Other | Source: Ambulatory Visit | Attending: Cardiovascular Disease | Admitting: Cardiovascular Disease

## 2015-05-14 DIAGNOSIS — Z48812 Encounter for surgical aftercare following surgery on the circulatory system: Secondary | ICD-10-CM | POA: Diagnosis not present

## 2015-05-14 NOTE — Progress Notes (Signed)
60 day psychosocial assessment:  No needs identified, no interventions necessary. Pt reports decreased dyspnea on exertion since beginning cardiac rehab.  Will continue to monitor.

## 2015-05-17 ENCOUNTER — Encounter (HOSPITAL_COMMUNITY)
Admission: RE | Admit: 2015-05-17 | Discharge: 2015-05-17 | Disposition: A | Discharge status: Home / Self Care | Payer: Medicare Other | Source: Ambulatory Visit | Attending: Cardiovascular Disease | Admitting: Cardiovascular Disease

## 2015-05-17 DIAGNOSIS — Z48812 Encounter for surgical aftercare following surgery on the circulatory system: Secondary | ICD-10-CM | POA: Diagnosis not present

## 2015-05-19 ENCOUNTER — Encounter (HOSPITAL_COMMUNITY)
Admission: RE | Admit: 2015-05-19 | Discharge: 2015-05-19 | Disposition: A | Discharge status: Home / Self Care | Payer: Medicare Other | Source: Ambulatory Visit | Attending: Cardiovascular Disease | Admitting: Cardiovascular Disease

## 2015-05-19 DIAGNOSIS — Z48812 Encounter for surgical aftercare following surgery on the circulatory system: Secondary | ICD-10-CM | POA: Diagnosis not present

## 2015-05-19 NOTE — Progress Notes (Signed)
Mr Anthony Carpenter obtained a 1/2 cm skin tear getting off the airdyne at cardiac rehab to his right outer forearm. The area was cleaned with soap and water. I also cleaned Mr Anthony Carpenter's  Right forearm with alcohol. I placed a small guaze and opsite to the patients skin tear. The patient was instructed to be careful getting on and off the exercise equipment at cardiac rehab. The patient states understanding and will monitor his forearm at home.

## 2015-05-21 ENCOUNTER — Encounter (HOSPITAL_COMMUNITY): Admission: RE | Admit: 2015-05-21 | Payer: Medicare Other | Source: Ambulatory Visit

## 2015-05-26 ENCOUNTER — Encounter (HOSPITAL_COMMUNITY)
Admission: RE | Admit: 2015-05-26 | Discharge: 2015-05-26 | Disposition: A | Discharge status: Home / Self Care | Payer: Medicare Other | Source: Ambulatory Visit | Attending: Cardiovascular Disease | Admitting: Cardiovascular Disease

## 2015-05-26 DIAGNOSIS — Z955 Presence of coronary angioplasty implant and graft: Secondary | ICD-10-CM | POA: Diagnosis not present

## 2015-05-26 DIAGNOSIS — Z48812 Encounter for surgical aftercare following surgery on the circulatory system: Secondary | ICD-10-CM | POA: Insufficient documentation

## 2015-05-28 ENCOUNTER — Encounter (HOSPITAL_COMMUNITY)
Admission: RE | Admit: 2015-05-28 | Discharge: 2015-05-28 | Disposition: A | Discharge status: Home / Self Care | Payer: Medicare Other | Source: Ambulatory Visit | Attending: Cardiovascular Disease | Admitting: Cardiovascular Disease

## 2015-05-28 DIAGNOSIS — Z48812 Encounter for surgical aftercare following surgery on the circulatory system: Secondary | ICD-10-CM | POA: Diagnosis not present

## 2015-05-31 ENCOUNTER — Encounter (HOSPITAL_COMMUNITY)
Admission: RE | Admit: 2015-05-31 | Discharge: 2015-05-31 | Disposition: A | Discharge status: Home / Self Care | Payer: Medicare Other | Source: Ambulatory Visit | Attending: Cardiovascular Disease | Admitting: Cardiovascular Disease

## 2015-05-31 DIAGNOSIS — Z48812 Encounter for surgical aftercare following surgery on the circulatory system: Secondary | ICD-10-CM | POA: Diagnosis not present

## 2015-06-02 ENCOUNTER — Encounter (HOSPITAL_COMMUNITY)
Admission: RE | Admit: 2015-06-02 | Discharge: 2015-06-02 | Disposition: A | Discharge status: Home / Self Care | Payer: Medicare Other | Source: Ambulatory Visit | Attending: Cardiovascular Disease | Admitting: Cardiovascular Disease

## 2015-06-02 DIAGNOSIS — Z48812 Encounter for surgical aftercare following surgery on the circulatory system: Secondary | ICD-10-CM | POA: Diagnosis not present

## 2015-06-04 ENCOUNTER — Encounter (HOSPITAL_COMMUNITY)
Admission: RE | Admit: 2015-06-04 | Discharge: 2015-06-04 | Disposition: A | Discharge status: Home / Self Care | Payer: Medicare Other | Source: Ambulatory Visit | Attending: Cardiovascular Disease | Admitting: Cardiovascular Disease

## 2015-06-04 DIAGNOSIS — Z48812 Encounter for surgical aftercare following surgery on the circulatory system: Secondary | ICD-10-CM | POA: Diagnosis not present

## 2015-06-07 ENCOUNTER — Encounter (HOSPITAL_COMMUNITY)
Admission: RE | Admit: 2015-06-07 | Discharge: 2015-06-07 | Disposition: A | Discharge status: Home / Self Care | Payer: Medicare Other | Source: Ambulatory Visit | Attending: Cardiovascular Disease | Admitting: Cardiovascular Disease

## 2015-06-07 DIAGNOSIS — Z48812 Encounter for surgical aftercare following surgery on the circulatory system: Secondary | ICD-10-CM | POA: Diagnosis not present

## 2015-06-09 ENCOUNTER — Encounter (HOSPITAL_COMMUNITY)
Admission: RE | Admit: 2015-06-09 | Discharge: 2015-06-09 | Disposition: A | Discharge status: Home / Self Care | Payer: Medicare Other | Source: Ambulatory Visit | Attending: Cardiovascular Disease | Admitting: Cardiovascular Disease

## 2015-06-09 DIAGNOSIS — Z48812 Encounter for surgical aftercare following surgery on the circulatory system: Secondary | ICD-10-CM | POA: Diagnosis not present

## 2015-06-11 ENCOUNTER — Encounter (HOSPITAL_COMMUNITY)
Admission: RE | Admit: 2015-06-11 | Discharge: 2015-06-11 | Disposition: A | Discharge status: Home / Self Care | Payer: Medicare Other | Source: Ambulatory Visit | Attending: Cardiovascular Disease | Admitting: Cardiovascular Disease

## 2015-06-11 DIAGNOSIS — Z48812 Encounter for surgical aftercare following surgery on the circulatory system: Secondary | ICD-10-CM | POA: Diagnosis not present

## 2015-06-14 ENCOUNTER — Encounter (HOSPITAL_COMMUNITY)
Admission: RE | Admit: 2015-06-14 | Discharge: 2015-06-14 | Disposition: A | Discharge status: Home / Self Care | Payer: Medicare Other | Source: Ambulatory Visit | Attending: Cardiovascular Disease | Admitting: Cardiovascular Disease

## 2015-06-14 ENCOUNTER — Encounter (HOSPITAL_COMMUNITY): Payer: Self-pay

## 2015-06-14 DIAGNOSIS — Z48812 Encounter for surgical aftercare following surgery on the circulatory system: Secondary | ICD-10-CM | POA: Diagnosis not present

## 2015-06-14 NOTE — Progress Notes (Signed)
Pt graduated from cardiac rehab program today with completion of 36 exercise sessions in Phase II. Pt maintained good attendance and progressed nicely during his participation in rehab as evidenced by increased MET level.   Medication list reconciled. Repeat  PHQ score- 0 .  Pt has made significant lifestyle changes and should be commended for his success. Pt feels he has achieved his goals during cardiac rehab which include resuming exercise routine without chest pain, dyspnea and fatigue.  Pt is also pleased with his weight loss.   Pt plans to continue exercise on his own at home with stationary bicycle and treadmill.  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  Pt educated about warning signs/symptoms and when to notify MD.  Understanding verbalized.

## 2015-06-16 ENCOUNTER — Encounter (HOSPITAL_COMMUNITY): Payer: Medicare Other

## 2015-06-18 ENCOUNTER — Encounter (HOSPITAL_COMMUNITY): Payer: Medicare Other

## 2015-06-21 ENCOUNTER — Encounter (HOSPITAL_COMMUNITY): Payer: Medicare Other

## 2015-06-23 ENCOUNTER — Encounter (HOSPITAL_COMMUNITY): Payer: Medicare Other

## 2015-06-25 ENCOUNTER — Encounter (HOSPITAL_COMMUNITY): Payer: Medicare Other

## 2015-09-13 ENCOUNTER — Encounter: Payer: Self-pay | Admitting: Cardiovascular Disease

## 2015-09-13 ENCOUNTER — Ambulatory Visit (INDEPENDENT_AMBULATORY_CARE_PROVIDER_SITE_OTHER): Payer: Medicare Other | Admitting: Cardiovascular Disease

## 2015-09-13 ENCOUNTER — Other Ambulatory Visit (INDEPENDENT_AMBULATORY_CARE_PROVIDER_SITE_OTHER): Payer: Medicare Other | Admitting: *Deleted

## 2015-09-13 VITALS — BP 122/64 | HR 45 | Ht 70.0 in | Wt 209.8 lb

## 2015-09-13 DIAGNOSIS — I10 Essential (primary) hypertension: Secondary | ICD-10-CM | POA: Diagnosis not present

## 2015-09-13 DIAGNOSIS — I2510 Atherosclerotic heart disease of native coronary artery without angina pectoris: Secondary | ICD-10-CM | POA: Diagnosis not present

## 2015-09-13 DIAGNOSIS — E785 Hyperlipidemia, unspecified: Secondary | ICD-10-CM

## 2015-09-13 LAB — HEPATIC FUNCTION PANEL
ALBUMIN: 3.9 g/dL (ref 3.6–5.1)
ALK PHOS: 61 U/L (ref 40–115)
ALT: 20 U/L (ref 9–46)
AST: 20 U/L (ref 10–35)
BILIRUBIN DIRECT: 0.2 mg/dL (ref <=0.2)
BILIRUBIN INDIRECT: 0.7 mg/dL (ref 0.2–1.2)
Total Bilirubin: 0.9 mg/dL (ref 0.2–1.2)
Total Protein: 6.7 g/dL (ref 6.1–8.1)

## 2015-09-13 LAB — BASIC METABOLIC PANEL
BUN: 14 mg/dL (ref 7–25)
CALCIUM: 9 mg/dL (ref 8.6–10.3)
CHLORIDE: 107 mmol/L (ref 98–110)
CO2: 23 mmol/L (ref 20–31)
Creat: 0.91 mg/dL (ref 0.70–1.18)
GLUCOSE: 90 mg/dL (ref 65–99)
Potassium: 3.7 mmol/L (ref 3.5–5.3)
Sodium: 139 mmol/L (ref 135–146)

## 2015-09-13 LAB — LIPID PANEL
Cholesterol: 103 mg/dL — ABNORMAL LOW (ref 125–200)
HDL: 32 mg/dL — AB (ref >=40)
LDL CALC: 59 mg/dL (ref <130)
Total CHOL/HDL Ratio: 3.2 Ratio (ref <=5.0)
Triglycerides: 62 mg/dL (ref <150)
VLDL: 12 mg/dL (ref <30)

## 2015-09-13 MED ORDER — LISINOPRIL 10 MG PO TABS: 10.0000 mg | ORAL_TABLET | Freq: Every day | ORAL | Status: DC

## 2015-09-13 MED ORDER — OMEGA-3-ACID ETHYL ESTERS 1 G PO CAPS
2.0000 g | ORAL_CAPSULE | Freq: Every day | ORAL | Status: DC
Start: 2015-09-13 — End: 2015-09-15

## 2015-09-13 NOTE — Patient Instructions (Signed)
Medication Instructions:  Your physician recommends that you continue on your current medications as directed. Please refer to the Current Medication list given to you today.   Labwork: TODAY - cholesterol, liver, basic metabolic panel   Testing/Procedures: None Ordered   Follow-Up: Your physician wants you to follow-up in: 6 months with Dr. Nahser.  You will receive a reminder letter in the mail two months in advance. If you don't receive a letter, please call our office to schedule the follow-up appointment.     

## 2015-09-13 NOTE — Addendum Note (Signed)
Addended by: Tonita PhoenixBOWDEN, ROBIN K on: 09/13/2015 09:33 AM   Modules accepted: Orders

## 2015-09-13 NOTE — Progress Notes (Signed)
Cardiology Office Note   Date:  09/13/2015   ID:  Anthony Carpenter, DOB 1940/12/02, MRN [id number]  PCP:  Lenora Boys, MD  Cardiologist:   Anthony Mixer, MD   Chief Complaint  Patient presents with  . Coronary Artery Disease    1. Coronary artery disease-status post PTCA and stenting of his mid LAD.  ( February 11, 2015)    We placed a 2.75 x 20 mm Taxus stent. It was post dilated using a 3.0 Quantum Maverick, 07/28/05  2. Hyperlipidemia-   3. Possible bicuspid aortic valve   History of Present Illness:  Anthony Carpenter is doing well. No chest pain or dyspnea. He plans on putting in a garden this summer . He's building his house this year. He was admitted to the hospital with some chest pain/abdominal pain. A Myoview study did not reveal any ischemia.  He has felt better. No further episodes of chest pain. He forgot to take his meds this am.  Feb. 26, 2014: Anthony Carpenter is doing well. He has not had any angina. He remains active - still building houses.   July 14, 2013:  Anthony Carpenter is doing Well. No angina.   July 14, 2014:  Anthony Carpenter is doing well. Still working No CP, no dyspnea.   Dec. 11, 2015;  Anthony Carpenter was in the hospital for CP recently. He was having vague chest discomfort. Cath showed a 60 % instent restenosis. Pains have resolved. He is back doing all of his normal activities. BP has been lower   February 05, 2015:  Anthony Carpenter is a 74 y.o. male who presents for some CP and DOE for the past month. 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 Will last as long as he's working . Eases up if he stops.   March 17, 2015:  Anthony Carpenter is a 73 y.o. male who presents for follow up of an episode of unstable angina.  I saw him in March for symptoms worisome of UAP.  CAth showed.   Left main: No obstructive disease.   Left Anterior Descending Artery: Large caliber vessel that courses to the apex. The mid segment is stented. The old stent  has 99% restenosis. The distal vessel is free of obstructive disease. The Diagonal branch is moderate in caliber with ostial 30% stenosis where the vessel is jailed by the LAD stent.  Circumflex Artery: Large caliber vessel with moderate caliber obtuse marginal branch. The obtuse marginal branch has proximal 50% stenosis.  Right Coronary Artery: Large dominant vessel with 30% mid stenosis.  Left Ventricular Angiogram: LVEF=45-50% with hypokinesis of the anterior wall and apex.  He had 3.0 x 16 mm Promus Premier DES in the mid LAD. The stent was post-dilated with a 3.25 x 12 mm [place] balloon x 1. The stenosis was taken from 99% down to 0%.   He is feeling much better.   Not shortness of breath   Participating in cardiac rehab. Bruising from the plavix.   But no real   Oct. 24, 2016:  Anthony Carpenter is doing well. Walking regularly  Has several projects doing on. Developing some land  Has some bruising and occasional nose bleeds.    Past Medical History  Diagnosis Date  . Hyperlipidemia   . Coronary artery disease     a. CAD s/p PTCA/Taxus stent to mid-prox LAD 2006  b. cath 10/02/14 with 60% focal instent restenosis in LAD prox to the Diag takeoff, 30% mid AV groove left  circ stenosis and luminal irregularities in a large dominant RCA. FFR of LAD was 0.85 and not felt to be HD significant to warrant PCI.  Marland Kitchen Bradycardia   . Bicuspid aortic valve   . Aortic stenosis, mild   . Carotid bruit present     a. Right side  . Pulmonary nodule seen on imaging study     a. seen on CT during 09/2014 admission, may require follow up imaging   . Myocardial infarction (HCC) 2006  . Hypertension     Past Surgical History  Procedure Laterality Date  . Cardiac catheterization  07/28/2005    Est EF of 55% -- Single-vessel obstructive disease in the proximal/mid left anterior descending -- Preserved left ventricular systolic function with good anterior wall motion  . Coronary angioplasty with stent  placement  07/28/2005    Successful PTCA and stenting of the mid and proximal left anterior descending artery.  The patient is stable leaving the catheterization laboratory -- Anthony Carpenter, M.D.    . Cataracts    . Left heart catheterization with coronary angiogram N/A 10/02/2014    Procedure: LEFT HEART CATHETERIZATION WITH CORONARY ANGIOGRAM;  Surgeon: Anthony Bihari, MD;  Location: [facility] CATH LAB;  Service: Cardiovascular;  Laterality: N/A;  . Coronary stent placement  02/11/2015    3.0 x 16 mm Promus Premier DES to the mLAD for ISR  . Colonoscopy  ? 2009  . Left heart catheterization with coronary angiogram N/A 02/11/2015    Procedure: LEFT HEART CATHETERIZATION WITH CORONARY ANGIOGRAM;  Surgeon: Anthony Hazel, MD; LAD 99% ISR, CFX okay, OM1 50%, RCA 30%, D1 30% (jailed by the LAD stent), EF 45-50 %     Current Outpatient Prescriptions  Medication Sig Dispense Refill  . amoxicillin (AMOXIL) 500 MG capsule Take 4 caps 1 hour before dental procedure (Patient not taking: Reported on 06/14/2015) 4 capsule 0  . aspirin EC 81 MG tablet Take 81 mg by mouth daily.    Marland Kitchen atorvastatin (LIPITOR) 80 MG tablet Take 1 tablet (80 mg total) by mouth daily. 90 tablet 3  . clopidogrel (PLAVIX) 75 MG tablet Take 1 tablet (75 mg total) by mouth daily with breakfast. 90 tablet 3  . dorzolamide-timolol (COSOPT) 22.3-6.8 MG/ML ophthalmic solution Place 1 drop into both eyes 2 (two) times daily.     . isosorbide mononitrate (IMDUR) 30 MG 24 hr tablet Take 1 tablet (30 mg total) by mouth daily. 90 tablet 3  . latanoprost (XALATAN) 0.005 % ophthalmic solution Place 1 drop into both eyes at bedtime.     Marland Kitchen lisinopril (PRINIVIL,ZESTRIL) 10 MG tablet Take 1 tablet (10 mg total) by mouth daily. 90 tablet 2  . Multiple Vitamin (MULITIVITAMIN WITH MINERALS) TABS Take 1 tablet by mouth daily.    . Multiple Vitamins-Minerals (ICAPS AREDS FORMULA PO) Take by mouth daily. Patient reports taking 2 tablets daily    .  nitroGLYCERIN (NITROSTAT) 0.4 MG SL tablet Place 1 tablet (0.4 mg total) under the tongue every 5 (five) minutes as needed. For chest pain. 25 tablet 8  . omega-3 acid ethyl esters (LOVAZA) 1 G capsule Take 2 capsules (2 g total) by mouth daily. 180 capsule 3   No current facility-administered medications for this visit.    Allergies:   Review of patient's allergies indicates no known allergies.    Social History:  The patient  reports that he has never smoked. He has never used smokeless tobacco. He reports that he does not drink  alcohol or use illicit drugs.   Family History:  The patient's family history includes Cancer in an other family member; Lung cancer in his father. There is no history of Heart disease.    ROS:  Please see the history of present illness.    Review of Systems: Constitutional:  denies fever, chills, diaphoresis, appetite change and fatigue.  HEENT: denies photophobia, eye pain, redness, hearing loss, ear pain, congestion, sore throat, rhinorrhea, sneezing, neck pain, neck stiffness and tinnitus.  Respiratory: denies SOB, DOE, cough, chest tightness, and wheezing.  Cardiovascular: denies chest pain, palpitations and leg swelling.  Gastrointestinal: denies nausea, vomiting, abdominal pain, diarrhea, constipation, blood in stool.  Genitourinary: denies dysuria, urgency, frequency, hematuria, flank pain and difficulty urinating.  Musculoskeletal: denies  myalgias, back pain, joint swelling, arthralgias and gait problem.   Skin: denies pallor, rash and wound.  Neurological: denies dizziness, seizures, syncope, weakness, light-headedness, numbness and headaches.   Hematological: denies adenopathy, easy bruising, personal or family bleeding history.  Psychiatric/ Behavioral: denies suicidal ideation, mood changes, confusion, nervousness, sleep disturbance and agitation.       All other systems are reviewed and negative.    PHYSICAL EXAM: VS:  BP 122/64 mmHg   Pulse 45  Ht 5\' 10"  (1.778 m)  Wt 209 lb 12.8 oz (95.165 kg)  BMI 30.10 kg/m2  SpO2 98% , BMI Body mass index is 30.1 kg/(m^2). GEN: Well nourished, well developed, in no acute distress HEENT: normal Neck: no JVD, carotid bruits, or masses Cardiac: RRR; no murmurs, rubs, or gallops,no edema  Respiratory:  clear to auscultation bilaterally, normal work of breathing GI: soft, nontender, nondistended, + BS MS: no deformity or atrophy, right radial cath site looks great , pulses are intact Skin: warm and dry, no rash Neuro:  Strength and sensation are intact Psych: normal   EKG:  EKG is not ordered today. The ekg ordered today demonstrates    Recent Labs: 10/01/2014: Pro B Natriuretic peptide (BNP) 241.6* 10/02/2014: ALT 42 02/12/2015: BUN 12; Creatinine, Ser 0.90; Hemoglobin 13.9; Platelets 141*; Potassium 3.6; Sodium 137    Lipid Panel    Component Value Date/Time   CHOL 128 07/14/2014 1008   TRIG 65.0 07/14/2014 1008   HDL 37.90* 07/14/2014 1008   CHOLHDL 3 07/14/2014 1008   VLDL 13.0 07/14/2014 1008   LDLCALC 77 07/14/2014 1008      Wt Readings from Last 3 Encounters:  09/13/15 209 lb 12.8 oz (95.165 kg)  03/17/15 212 lb 9.6 oz (96.435 kg)  03/11/15 212 lb 1.3 oz (96.2 kg)      Other studies Reviewed: Additional studies/ records that were reviewed today include: . Review of the above records demonstrates:    ASSESSMENT AND PLAN:   1. Coronary artery disease-status post PTCA and stenting of his mid LAD. We placed a 2.75 x 20 mm Taxus stent,  Now with a 3.0 x 16 mm Promus Premier DES in the mid LAD. The stent was post-dilated with a 3.25 x 12 mm [place] balloon.   he's doing very well. He's not had any episodes of chest pain.  continue same medications. Ill see him in 6 months    2. Hyperlipidemia-will check labs today   3. Possible bicuspid aortic valve-    Current medicines are reviewed at length with the patient today.  The patient does not have concerns  regarding medicines.  The following changes have been made:  no change  Labs/ tests ordered today include:  No orders of the  defined types were placed in this encounter.     Disposition:   FU with me in 6 months      Signed, Anthony Carpenter, Anthony Ping, MD  09/13/2015 9:09 AM    [facility] [address] [place], [place], [place]  [address] Phone: [phone]; Fax: [phone]

## 2015-09-15 ENCOUNTER — Telehealth: Payer: Self-pay | Admitting: Nurse Practitioner

## 2015-09-15 DIAGNOSIS — E785 Hyperlipidemia, unspecified: Secondary | ICD-10-CM

## 2015-09-15 MED ORDER — OMEGA-3-ACID ETHYL ESTERS 1 G PO CAPS
2.0000 g | ORAL_CAPSULE | Freq: Every day | ORAL | Status: DC
Start: 2015-09-15 — End: 2016-01-03

## 2015-09-15 MED ORDER — LISINOPRIL 10 MG PO TABS: 10.0000 mg | ORAL_TABLET | Freq: Every day | ORAL | Status: DC

## 2015-09-15 NOTE — Telephone Encounter (Signed)
Patient requests recent Rx refills to [facility] rather than [facility].  Lisinopril and generic Lovaza Rx sent.

## 2015-12-27 ENCOUNTER — Other Ambulatory Visit: Payer: Self-pay | Admitting: Cardiovascular Disease

## 2015-12-27 DIAGNOSIS — E785 Hyperlipidemia, unspecified: Secondary | ICD-10-CM

## 2015-12-27 NOTE — Telephone Encounter (Signed)
°*  STAT* If patient is at the pharmacy, call can be transferred to refill team.   1. Which medications need to be refilled? (please list name of each medication and dose if known) All Cardiac Meds   2. Which pharmacy/location (including street and city if local pharmacy) is medication to be sent to? [facility]   3. Do they need a 30 day or 90 day supply? 90

## 2015-12-28 MED ORDER — NITROGLYCERIN 0.4 MG SL SUBL: 0.4000 mg | SUBLINGUAL_TABLET | SUBLINGUAL | Status: DC | PRN

## 2015-12-28 MED ORDER — CLOPIDOGREL BISULFATE 75 MG PO TABS
75.0000 mg | ORAL_TABLET | Freq: Every day | ORAL | Status: DC
Start: 2015-12-28 — End: 2016-03-13

## 2015-12-28 MED ORDER — ISOSORBIDE MONONITRATE ER 30 MG PO TB24: 30.0000 mg | ORAL_TABLET | Freq: Every day | ORAL | Status: DC

## 2015-12-28 MED ORDER — ATORVASTATIN CALCIUM 80 MG PO TABS: 80.0000 mg | ORAL_TABLET | Freq: Every day | ORAL | Status: DC

## 2015-12-28 NOTE — Telephone Encounter (Signed)
Pt's Rx was sent to pt's pharmacy as requested. Confirmation received.  °

## 2015-12-28 NOTE — Telephone Encounter (Signed)
Follow up  Atorvastatin Clopidogrel Isosorbide Nitrostat

## 2015-12-28 NOTE — Telephone Encounter (Signed)
Please inquire about the name of the medications pt is requesting. Thank you

## 2016-01-03 ENCOUNTER — Other Ambulatory Visit: Payer: Self-pay | Admitting: *Deleted

## 2016-01-03 DIAGNOSIS — E785 Hyperlipidemia, unspecified: Secondary | ICD-10-CM

## 2016-01-03 MED ORDER — OMEGA-3-ACID ETHYL ESTERS 1 G PO CAPS: 2.0000 g | ORAL_CAPSULE | Freq: Every day | ORAL | Status: DC

## 2016-01-31 ENCOUNTER — Other Ambulatory Visit: Payer: Self-pay

## 2016-01-31 DIAGNOSIS — E785 Hyperlipidemia, unspecified: Secondary | ICD-10-CM

## 2016-01-31 MED ORDER — LISINOPRIL 10 MG PO TABS: 10.0000 mg | ORAL_TABLET | Freq: Every day | ORAL | Status: DC

## 2016-03-07 ENCOUNTER — Encounter: Payer: Self-pay | Admitting: Cardiovascular Disease

## 2016-03-13 ENCOUNTER — Encounter: Payer: Self-pay | Admitting: Cardiovascular Disease

## 2016-03-13 ENCOUNTER — Ambulatory Visit (INDEPENDENT_AMBULATORY_CARE_PROVIDER_SITE_OTHER): Payer: Medicare Other | Admitting: Cardiovascular Disease

## 2016-03-13 VITALS — BP 104/62 | HR 47 | Ht 70.0 in | Wt 212.8 lb

## 2016-03-13 DIAGNOSIS — I2510 Atherosclerotic heart disease of native coronary artery without angina pectoris: Secondary | ICD-10-CM | POA: Diagnosis not present

## 2016-03-13 DIAGNOSIS — I10 Essential (primary) hypertension: Secondary | ICD-10-CM | POA: Diagnosis not present

## 2016-03-13 DIAGNOSIS — E785 Hyperlipidemia, unspecified: Secondary | ICD-10-CM | POA: Diagnosis not present

## 2016-03-13 MED ORDER — ATORVASTATIN CALCIUM 80 MG PO TABS: 80.0000 mg | ORAL_TABLET | Freq: Every day | ORAL | Status: DC

## 2016-03-13 MED ORDER — CLOPIDOGREL BISULFATE 75 MG PO TABS: 75.0000 mg | ORAL_TABLET | Freq: Every day | ORAL | Status: DC

## 2016-03-13 MED ORDER — ISOSORBIDE MONONITRATE ER 30 MG PO TB24: 30.0000 mg | ORAL_TABLET | Freq: Every day | ORAL | Status: DC

## 2016-03-13 NOTE — Progress Notes (Signed)
Cardiology Office Note   Date:  03/13/2016   ID:  KOBYN KRAY, DOB 1941/02/12, MRN [id number]  PCP:  Lenora Boys, MD  Cardiologist:   Vesta Mixer, MD   Chief Complaint  Patient presents with  . Coronary Artery Disease    1. Coronary artery disease-status post PTCA and stenting of his mid LAD.  ( February 11, 2015)    We placed a 2.75 x 20 mm Taxus stent. It was post dilated using a 3.0 Quantum Maverick, 07/28/05  2. Hyperlipidemia-   3. Possible bicuspid aortic valve   History of Present Illness:  Anthony Carpenter is doing well. No chest pain or dyspnea. He plans on putting in a garden this summer . He's building his house this year. He was admitted to the hospital with some chest pain/abdominal pain. A Myoview study did not reveal any ischemia.  He has felt better. No further episodes of chest pain. He forgot to take his meds this am.  Feb. 26, 2014: Anthony Carpenter is doing well. He has not had any angina. He remains active - still building houses.   July 14, 2013:  Anthony Carpenter is doing Well. No angina.   July 14, 2014:  Anthony Carpenter is doing well. Still working No CP, no dyspnea.   Dec. 11, 2015;  Anthony Carpenter was in the hospital for CP recently. He was having vague chest discomfort. Cath showed a 60 % instent restenosis. Pains have resolved. He is back doing all of his normal activities. BP has been lower   February 05, 2015:  Anthony Carpenter is a 75 y.o. male who presents for some CP and DOE for the past month. 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 Will last as long as he's working . Eases up if he stops.   March 17, 2015:  Anthony Carpenter is a 75 y.o. male who presents for follow up of an episode of unstable angina.  I saw him in March for symptoms worisome of UAP.  CAth showed.   Left main: No obstructive disease.   Left Anterior Descending Artery: Large caliber vessel that courses to the apex. The mid segment is stented. The old stent  has 99% restenosis. The distal vessel is free of obstructive disease. The Diagonal branch is moderate in caliber with ostial 30% stenosis where the vessel is jailed by the LAD stent.  Circumflex Artery: Large caliber vessel with moderate caliber obtuse marginal branch. The obtuse marginal branch has proximal 50% stenosis.  Right Coronary Artery: Large dominant vessel with 30% mid stenosis.  Left Ventricular Angiogram: LVEF=45-50% with hypokinesis of the anterior wall and apex.  He had 3.0 x 16 mm Promus Premier DES in the mid LAD. The stent was post-dilated with a 3.25 x 12 mm [place] balloon x 1. The stenosis was taken from 99% down to 0%.   He is feeling much better.   Not shortness of breath   Participating in cardiac rehab. Bruising from the plavix.   But no real PC   Oct. 24, 2016:  Anthony Carpenter is doing well. Walking regularly  Has several projects doing on. Developing some land  Has some bruising and occasional nose bleeds.   March 13, 2016:  Staying active.    Occasional nose bleeds    Past Medical History  Diagnosis Date  . Hyperlipidemia   . Coronary artery disease     a. CAD s/p PTCA/Taxus stent to mid-prox LAD 2006  b. cath 10/02/14 with 60%  focal instent restenosis in LAD prox to the Diag takeoff, 30% mid AV groove left circ stenosis and luminal irregularities in a large dominant RCA. FFR of LAD was 0.85 and not felt to be HD significant to warrant PCI.  Marland Kitchen Bradycardia   . Bicuspid aortic valve   . Aortic stenosis, mild   . Carotid bruit present     a. Right side  . Pulmonary nodule seen on imaging study     a. seen on CT during 09/2014 admission, may require follow up imaging   . Myocardial infarction (HCC) 2006  . Hypertension     Past Surgical History  Procedure Laterality Date  . Cardiac catheterization  07/28/2005    Est EF of 55% -- Single-vessel obstructive disease in the proximal/mid left anterior descending -- Preserved left ventricular systolic function with  good anterior wall motion  . Coronary angioplasty with stent placement  07/28/2005    Successful PTCA and stenting of the mid and proximal left anterior descending artery.  The patient is stable leaving the catheterization laboratory -- Vesta Mixer, M.D.    . Cataracts    . Left heart catheterization with coronary angiogram N/A 10/02/2014    Procedure: LEFT HEART CATHETERIZATION WITH CORONARY ANGIOGRAM;  Surgeon: Lennette Bihari, MD;  Location: [facility] CATH LAB;  Service: Cardiovascular;  Laterality: N/A;  . Coronary stent placement  02/11/2015    3.0 x 16 mm Promus Premier DES to the mLAD for ISR  . Colonoscopy  ? 2009  . Left heart catheterization with coronary angiogram N/A 02/11/2015    Procedure: LEFT HEART CATHETERIZATION WITH CORONARY ANGIOGRAM;  Surgeon: Kathleene Hazel, MD; LAD 99% ISR, CFX okay, OM1 50%, RCA 30%, D1 30% (jailed by the LAD stent), EF 45-50 %     Current Outpatient Prescriptions  Medication Sig Dispense Refill  . aspirin EC 81 MG tablet Take 81 mg by mouth daily.    Marland Kitchen atorvastatin (LIPITOR) 80 MG tablet Take 1 tablet (80 mg total) by mouth daily. 90 tablet 0  . clopidogrel (PLAVIX) 75 MG tablet Take 1 tablet (75 mg total) by mouth daily with breakfast. 90 tablet 0  . dorzolamide-timolol (COSOPT) 22.3-6.8 MG/ML ophthalmic solution Place 1 drop into both eyes 2 (two) times daily.     . isosorbide mononitrate (IMDUR) 30 MG 24 hr tablet Take 1 tablet (30 mg total) by mouth daily. 90 tablet 0  . latanoprost (XALATAN) 0.005 % ophthalmic solution Place 1 drop into both eyes at bedtime.     Marland Kitchen lisinopril (PRINIVIL,ZESTRIL) 10 MG tablet Take 1 tablet (10 mg total) by mouth daily. 90 tablet 3  . Multiple Vitamin (MULITIVITAMIN WITH MINERALS) TABS Take 1 tablet by mouth daily.    . Multiple Vitamins-Minerals (ICAPS AREDS FORMULA PO) Take by mouth daily. Patient reports taking 2 tablets daily    . nitroGLYCERIN (NITROSTAT) 0.4 MG SL tablet Place 1 tablet (0.4 mg total) under  the tongue every 5 (five) minutes as needed. For chest pain. 25 tablet 0  . omega-3 acid ethyl esters (LOVAZA) 1 g capsule Take 2 capsules (2 g total) by mouth daily. 60 capsule 0  . amoxicillin (AMOXIL) 500 MG capsule Take 4 caps 1 hour before dental procedure (Patient not taking: Reported on 06/14/2015) 4 capsule 0   No current facility-administered medications for this visit.    Allergies:   Review of patient's allergies indicates no known allergies.    Social History:  The patient  reports that he has  never smoked. He has never used smokeless tobacco. He reports that he does not drink alcohol or use illicit drugs.   Family History:  The patient's family history includes Lung cancer in his father. There is no history of Heart disease.    ROS:  Please see the history of present illness.    Review of Systems: Constitutional:  denies fever, chills, diaphoresis, appetite change and fatigue.  HEENT: denies photophobia, eye pain, redness, hearing loss, ear pain, congestion, sore throat, rhinorrhea, sneezing, neck pain, neck stiffness and tinnitus.  Respiratory: denies SOB, DOE, cough, chest tightness, and wheezing.  Cardiovascular: denies chest pain, palpitations and leg swelling.  Gastrointestinal: denies nausea, vomiting, abdominal pain, diarrhea, constipation, blood in stool.  Genitourinary: denies dysuria, urgency, frequency, hematuria, flank pain and difficulty urinating.  Musculoskeletal: denies  myalgias, back pain, joint swelling, arthralgias and gait problem.   Skin: denies pallor, rash and wound.  Neurological: denies dizziness, seizures, syncope, weakness, light-headedness, numbness and headaches.   Hematological: denies adenopathy, easy bruising, personal or family bleeding history.  Psychiatric/ Behavioral: denies suicidal ideation, mood changes, confusion, nervousness, sleep disturbance and agitation.       All other systems are reviewed and negative.    PHYSICAL  EXAM: VS:  BP 104/62 mmHg  Pulse 47  Ht 5\' 10"  (1.778 m)  Wt 212 lb 12.8 oz (96.525 kg)  BMI 30.53 kg/m2 , BMI Body mass index is 30.53 kg/(m^2). GEN: Well nourished, well developed, in no acute distress HEENT: normal Neck: no JVD, carotid bruits, or masses Cardiac: RRR; no murmurs, rubs, or gallops,no edema  Respiratory:  clear to auscultation bilaterally, normal work of breathing GI: soft, nontender, nondistended, + BS MS: no deformity or atrophy, right radial cath site looks great , pulses are intact Skin: warm and dry, no rash Neuro:  Strength and sensation are intact Psych: normal   EKG:  EKG is ordered today. The ekg ordered today demonstrates  Sinus brady at 47.  Otherwise normal ECG    Recent Labs: 09/13/2015: ALT 20; BUN 14; Creat 0.91; Potassium 3.7; Sodium 139    Lipid Panel    Component Value Date/Time   CHOL 103* 09/13/2015 0933   TRIG 62 09/13/2015 0933   HDL 32* 09/13/2015 0933   CHOLHDL 3.2 09/13/2015 0933   VLDL 12 09/13/2015 0933   LDLCALC 59 09/13/2015 0933      Wt Readings from Last 3 Encounters:  03/13/16 212 lb 12.8 oz (96.525 kg)  09/13/15 209 lb 12.8 oz (95.165 kg)  03/17/15 212 lb 9.6 oz (96.435 kg)      Other studies Reviewed: Additional studies/ records that were reviewed today include: . Review of the above records demonstrates:    ASSESSMENT AND PLAN:   1. Coronary artery disease-status post PTCA and stenting of his mid LAD. We placed a 2.75 x 20 mm Taxus stent,  Now with a 3.0 x 16 mm Promus Premier DES in the mid LAD. The stent was post-dilated with a 3.25 x 12 mm [place] balloon.   he's doing very well. He's not had any episodes of chest pain.  continue same medications. Ill see him in 6 months    2. Hyperlipidemia-will check labs in 6 months    3. Possible bicuspid aortic valve-    Current medicines are reviewed at length with the patient today.  The patient does not have concerns regarding medicines.  The following changes  have been made:  no change  Labs/ tests ordered today include:  No orders of the defined types were placed in this encounter.     Disposition:   FU with me in 6 months      Signed, Siana Panameno, Deloris Ping, MD  03/13/2016 8:50 AM    [facility] [address] [place], [place], [place]  [address] Phone: [phone]; Fax: [phone]

## 2016-03-13 NOTE — Patient Instructions (Addendum)
Medication Instructions:  Your physician recommends that you continue on your current medications as directed. Please refer to the Current Medication list given to you today. Refills of your medications have been sent  Labwork: Your physician recommends that you return for lab work in: 6 months on the day of or a few days before your office visit with Dr. Elease HashimotoNahser.  You will need to FAST for this appointment - nothing to eat or drink after midnight the night before except water.   Testing/Procedures: None ordered   Follow-Up: Your physician wants you to follow-up in: 6 months with  Dr. Elease HashimotoNahser.  You will receive a reminder letter in the mail two months in advance. If you don't receive a letter, please call our office to schedule the follow-up appointment.   If you need a refill on your cardiac medications before your next appointment, please call your pharmacy.   Thank you for choosing [facility]! Eligha BridegroomMichelle Swinyer, RN [phone]

## 2016-09-08 ENCOUNTER — Other Ambulatory Visit: Payer: Self-pay | Admitting: Cardiovascular Disease

## 2016-09-08 DIAGNOSIS — E785 Hyperlipidemia, unspecified: Secondary | ICD-10-CM

## 2016-09-11 ENCOUNTER — Encounter (INDEPENDENT_AMBULATORY_CARE_PROVIDER_SITE_OTHER): Payer: Self-pay

## 2016-09-11 ENCOUNTER — Ambulatory Visit (INDEPENDENT_AMBULATORY_CARE_PROVIDER_SITE_OTHER): Payer: Medicare Other | Admitting: Cardiovascular Disease

## 2016-09-11 ENCOUNTER — Encounter: Payer: Self-pay | Admitting: Cardiovascular Disease

## 2016-09-11 VITALS — BP 116/60 | HR 58 | Ht 70.0 in | Wt 204.4 lb

## 2016-09-11 DIAGNOSIS — E782 Mixed hyperlipidemia: Secondary | ICD-10-CM | POA: Diagnosis not present

## 2016-09-11 DIAGNOSIS — I2510 Atherosclerotic heart disease of native coronary artery without angina pectoris: Secondary | ICD-10-CM

## 2016-09-11 LAB — LIPID PANEL
CHOLESTEROL: 101 mg/dL — AB (ref 125–200)
HDL: 36 mg/dL — ABNORMAL LOW (ref >=40)
LDL Cholesterol: 51 mg/dL (ref <130)
TRIGLYCERIDES: 68 mg/dL (ref <150)
Total CHOL/HDL Ratio: 2.8 Ratio (ref <=5.0)
VLDL: 14 mg/dL (ref <30)

## 2016-09-11 LAB — COMPREHENSIVE METABOLIC PANEL
ALT: 13 U/L (ref 9–46)
AST: 17 U/L (ref 10–35)
Albumin: 3.8 g/dL (ref 3.6–5.1)
Alkaline Phosphatase: 56 U/L (ref 40–115)
BILIRUBIN TOTAL: 0.8 mg/dL (ref 0.2–1.2)
BUN: 11 mg/dL (ref 7–25)
CALCIUM: 8.7 mg/dL (ref 8.6–10.3)
CO2: 25 mmol/L (ref 20–31)
Chloride: 108 mmol/L (ref 98–110)
Creat: 0.95 mg/dL (ref 0.70–1.18)
GLUCOSE: 99 mg/dL (ref 65–99)
POTASSIUM: 3.9 mmol/L (ref 3.5–5.3)
Sodium: 141 mmol/L (ref 135–146)
TOTAL PROTEIN: 6.1 g/dL (ref 6.1–8.1)

## 2016-09-11 MED ORDER — OMEGA-3-ACID ETHYL ESTERS 1 G PO CAPS
2.0000 g | ORAL_CAPSULE | Freq: Every day | ORAL | 3 refills | Status: DC
Start: 2016-09-11 — End: 2017-10-22

## 2016-09-11 NOTE — Patient Instructions (Signed)
Medication Instructions:  STOP Aspirin STOP Amoxicillin   Labwork: TODAY - cholesterol, complete metabolic panel   Testing/Procedures: None Ordered   Follow-Up: Your physician wants you to follow-up in: 6 months with Dr. Elease HashimotoNahser.  You will receive a reminder letter in the mail two months in advance. If you don't receive a letter, please call our office to schedule the follow-up appointment.   If you need a refill on your cardiac medications before your next appointment, please call your pharmacy.   Thank you for choosing [facility]! Eligha BridegroomMichelle Kianni Lheureux, RN [phone]

## 2016-09-11 NOTE — Progress Notes (Signed)
Cardiology Office Note   Date:  09/11/2016   ID:  Anthony Carpenter, DOB 06/30/1941, MRN [id number]  PCP:  Lenora BoysFRIED, ROBERT L, MD  Cardiologist:   Kristeen MissPhilip Lauralyn Shadowens, MD   Chief Complaint  Patient presents with  . Follow-up    CAD, HTN, hyperlipidemia    1. Coronary artery disease-status post PTCA and stenting of his mid LAD.  ( February 11, 2015)    We placed a 2.75 x 20 mm Taxus stent. It was post dilated using a 3.0 Quantum Maverick, 07/28/05  2. Hyperlipidemia-   3. Possible bicuspid aortic valve      Anthony Carpenter is doing well. No chest pain or dyspnea. He plans on putting in a garden this summer . He's building his house this year. He was admitted to the hospital with some chest pain/abdominal pain. A Myoview study did not reveal any ischemia.  He has felt better. No further episodes of chest pain. He forgot to take his meds this am.  Feb. 26, 2014: Anthony Carpenter is doing well. He has not had any angina. He remains active - still building houses.   July 14, 2013:  Anthony Carpenter is doing Well. No angina.   July 14, 2014:  Anthony Carpenter is doing well. Still working No CP, no dyspnea.   Dec. 11, 2015;  Anthony Carpenter was in the hospital for CP recently. He was having vague chest discomfort. Cath showed a 60 % instent restenosis. Pains have resolved. He is back doing all of his normal activities. BP has been lower   February 05, 2015:  Anthony Carpenter is a 75 y.o. male who presents for some CP and DOE for the past month. 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 Will last as long as he's working . Eases up if he stops.   March 17, 2015:  Anthony Carpenter is a [age] y.o. male who presents for follow up of an episode of unstable angina.  I saw him in March for symptoms worisome of UAP.  CAth showed.   Left main: No obstructive disease.   Left Anterior Descending Artery: Large caliber vessel that courses to the apex. The mid segment is stented. The old stent has 99%  restenosis. The distal vessel is free of obstructive disease. The Diagonal branch is moderate in caliber with ostial 30% stenosis where the vessel is jailed by the LAD stent.  Circumflex Artery: Large caliber vessel with moderate caliber obtuse marginal branch. The obtuse marginal branch has proximal 50% stenosis.  Right Coronary Artery: Large dominant vessel with 30% mid stenosis.  Left Ventricular Angiogram: LVEF=45-50% with hypokinesis of the anterior wall and apex.  He had 3.0 x 16 mm Promus Premier DES in the mid LAD. The stent was post-dilated with a 3.25 x 12 mm [place] balloon x 1. The stenosis was taken from 99% down to 0%.   He is feeling much better.   Not shortness of breath   Participating in cardiac rehab. Bruising from the plavix.   But no real PC   Oct. 24, 2016:  Anthony Carpenter is doing well. Walking regularly  Has several projects doing on. Developing some land  Has some bruising and occasional nose bleeds.   March 13, 2016:  Staying active.    Occasional nose bleeds   Oct. 23, 2017:  Doing well .  No CP or dyspnea. Cooked 80 gallons of [place] stew this weekend  Has occasional nose bleed   Past Medical History:  Diagnosis Date  .  Aortic stenosis, mild   . Bicuspid aortic valve   . Bradycardia   . Carotid bruit present    a. Right side  . Coronary artery disease    a. CAD s/p PTCA/Taxus stent to mid-prox LAD 2006  b. cath 10/02/14 with 60% focal instent restenosis in LAD prox to the Diag takeoff, 30% mid AV groove left circ stenosis and luminal irregularities in a large dominant RCA. FFR of LAD was 0.85 and not felt to be HD significant to warrant PCI.  Marland Kitchen Hyperlipidemia   . Hypertension   . Myocardial infarction 2006  . Pulmonary nodule seen on imaging study    a. seen on CT during 09/2014 admission, may require follow up imaging     Past Surgical History:  Procedure Laterality Date  . CARDIAC CATHETERIZATION  07/28/2005   Est EF of 55% -- Single-vessel  obstructive disease in the proximal/mid left anterior descending -- Preserved left ventricular systolic function with good anterior wall motion  . cataracts    . COLONOSCOPY  ? 2009  . CORONARY ANGIOPLASTY WITH STENT PLACEMENT  07/28/2005   Successful PTCA and stenting of the mid and proximal left anterior descending artery.  The patient is stable leaving the catheterization laboratory -- Vesta Mixer, M.D.    . CORONARY STENT PLACEMENT  02/11/2015   3.0 x 16 mm Promus Premier DES to the mLAD for ISR  . LEFT HEART CATHETERIZATION WITH CORONARY ANGIOGRAM N/A 10/02/2014   Procedure: LEFT HEART CATHETERIZATION WITH CORONARY ANGIOGRAM;  Surgeon: Lennette Bihari, MD;  Location: [facility] CATH LAB;  Service: Cardiovascular;  Laterality: N/A;  . LEFT HEART CATHETERIZATION WITH CORONARY ANGIOGRAM N/A 02/11/2015   Procedure: LEFT HEART CATHETERIZATION WITH CORONARY ANGIOGRAM;  Surgeon: Kathleene Hazel, MD; LAD 99% ISR, CFX okay, OM1 50%, RCA 30%, D1 30% (jailed by the LAD stent), EF 45-50 %     Current Outpatient Prescriptions  Medication Sig Dispense Refill  . aspirin EC 81 MG tablet Take 81 mg by mouth daily.    Marland Kitchen atorvastatin (LIPITOR) 80 MG tablet Take 1 tablet (80 mg total) by mouth daily. 90 tablet 3  . clopidogrel (PLAVIX) 75 MG tablet Take 1 tablet (75 mg total) by mouth daily with breakfast. 90 tablet 3  . dorzolamide-timolol (COSOPT) 22.3-6.8 MG/ML ophthalmic solution Place 1 drop into both eyes 2 (two) times daily.     . isosorbide mononitrate (IMDUR) 30 MG 24 hr tablet Take 1 tablet (30 mg total) by mouth daily. 90 tablet 3  . latanoprost (XALATAN) 0.005 % ophthalmic solution Place 1 drop into both eyes at bedtime.     Marland Kitchen lisinopril (PRINIVIL,ZESTRIL) 10 MG tablet Take 1 tablet (10 mg total) by mouth daily. 90 tablet 3  . Multiple Vitamin (MULITIVITAMIN WITH MINERALS) TABS Take 1 tablet by mouth daily.    . Multiple Vitamins-Minerals (ICAPS AREDS FORMULA PO) Take by mouth daily. Patient  reports taking 2 tablets daily    . nitroGLYCERIN (NITROSTAT) 0.4 MG SL tablet Place 1 tablet (0.4 mg total) under the tongue every 5 (five) minutes as needed. For chest pain. 25 tablet 0  . omega-3 acid ethyl esters (LOVAZA) 1 g capsule Take 2 capsules (2 g total) by mouth daily. 60 capsule 0  . amoxicillin (AMOXIL) 500 MG capsule Take 4 caps 1 hour before dental procedure (Patient not taking: Reported on 09/11/2016) 4 capsule 0   No current facility-administered medications for this visit.     Allergies:   Review of patient's  allergies indicates no known allergies.    Social History:  The patient  reports that he has never smoked. He has never used smokeless tobacco. He reports that he does not drink alcohol or use drugs.   Family History:  The patient's family history includes Lung cancer in his father.    ROS:  Please see the history of present illness.    Review of Systems: Constitutional:  denies fever, chills, diaphoresis, appetite change and fatigue.  HEENT: denies photophobia, eye pain, redness, hearing loss, ear pain, congestion, sore throat, rhinorrhea, sneezing, neck pain, neck stiffness and tinnitus.  Respiratory: denies SOB, DOE, cough, chest tightness, and wheezing.  Cardiovascular: denies chest pain, palpitations and leg swelling.  Gastrointestinal: denies nausea, vomiting, abdominal pain, diarrhea, constipation, blood in stool.  Genitourinary: denies dysuria, urgency, frequency, hematuria, flank pain and difficulty urinating.  Musculoskeletal: denies  myalgias, back pain, joint swelling, arthralgias and gait problem.   Skin: denies pallor, rash and wound.  Neurological: denies dizziness, seizures, syncope, weakness, light-headedness, numbness and headaches.   Hematological: denies adenopathy, easy bruising, personal or family bleeding history.  Psychiatric/ Behavioral: denies suicidal ideation, mood changes, confusion, nervousness, sleep disturbance and agitation.        All other systems are reviewed and negative.    PHYSICAL EXAM: VS:  BP 116/60 (BP Location: Right Arm, Patient Position: Sitting, Cuff Size: Normal)   Pulse (!) 58   Ht 5\' 10"  (1.778 m)   Wt 204 lb 6.4 oz (92.7 kg)   BMI 29.33 kg/m  , BMI Body mass index is 29.33 kg/m. GEN: Well nourished, well developed, in no acute distress  HEENT: normal  Neck: no JVD, carotid bruits, or masses Cardiac: RRR; no murmurs, rubs, or gallops,no edema  Respiratory:  clear to auscultation bilaterally, normal work of breathing GI: soft, nontender, nondistended, + BS MS: no deformity or atrophy, right radial cath site looks great , pulses are intact Skin: warm and dry, no rash Neuro:  Strength and sensation are intact Psych: normal   EKG:  EKG is not ordered today.    Recent Labs: 09/13/2015: ALT 20; BUN 14; Creat 0.91; Potassium 3.7; Sodium 139    Lipid Panel    Component Value Date/Time   CHOL 103 (L) 09/13/2015 0933   TRIG 62 09/13/2015 0933   HDL 32 (L) 09/13/2015 0933   CHOLHDL 3.2 09/13/2015 0933   VLDL 12 09/13/2015 0933   LDLCALC 59 09/13/2015 0933      Wt Readings from Last 3 Encounters:  09/11/16 204 lb 6.4 oz (92.7 kg)  03/13/16 212 lb 12.8 oz (96.5 kg)  09/13/15 209 lb 12.8 oz (95.2 kg)      Other studies Reviewed: Additional studies/ records that were reviewed today include: . Review of the above records demonstrates:    ASSESSMENT AND PLAN:   1. Coronary artery disease-status post PTCA and stenting of his mid LAD. We placed a 2.75 x 20 mm Taxus stent,  Now with a 3.0 x 16 mm Promus Premier DES in the mid LAD. The stent was post-dilated with a 3.25 x 12 mm [place] balloon.   he's doing very well. He's not had any episodes of chest pain. Will DC ASA ( nose bleeds ) and continue plavix.   Ill see him in 6 months    2. Hyperlipidemia-will check labs today   3. Possible bicuspid aortic valve-    Current medicines are reviewed at length with the patient  today.  The patient does not  have concerns regarding medicines.  The following changes have been made:  no change  Labs/ tests ordered today include:  No orders of the defined types were placed in this encounter.   Disposition:   FU with me in 6 months      Signed, Kristeen Miss, MD  09/11/2016 8:34 AM    [facility] [address] [place], [place], [place]  [address] Phone: [phone]; Fax: [phone]

## 2016-12-01 ENCOUNTER — Other Ambulatory Visit: Payer: Self-pay | Admitting: Cardiovascular Disease

## 2016-12-01 DIAGNOSIS — E785 Hyperlipidemia, unspecified: Secondary | ICD-10-CM

## 2017-03-08 ENCOUNTER — Encounter: Payer: Self-pay | Admitting: Cardiovascular Disease

## 2017-03-14 ENCOUNTER — Other Ambulatory Visit: Payer: Self-pay | Admitting: Cardiovascular Disease

## 2017-03-28 ENCOUNTER — Encounter: Payer: Self-pay | Admitting: Cardiovascular Disease

## 2017-03-28 ENCOUNTER — Ambulatory Visit (INDEPENDENT_AMBULATORY_CARE_PROVIDER_SITE_OTHER): Payer: Medicare Other | Admitting: Cardiovascular Disease

## 2017-03-28 VITALS — BP 97/62 | HR 45 | Ht 70.0 in | Wt 199.8 lb

## 2017-03-28 DIAGNOSIS — E782 Mixed hyperlipidemia: Secondary | ICD-10-CM | POA: Diagnosis not present

## 2017-03-28 DIAGNOSIS — I2510 Atherosclerotic heart disease of native coronary artery without angina pectoris: Secondary | ICD-10-CM | POA: Diagnosis not present

## 2017-03-28 LAB — COMPREHENSIVE METABOLIC PANEL
A/G RATIO: 1.7 (ref 1.2–2.2)
ALBUMIN: 4 g/dL (ref 3.5–4.8)
ALT: 16 IU/L (ref 0–44)
AST: 20 IU/L (ref 0–40)
Alkaline Phosphatase: 63 IU/L (ref 39–117)
BILIRUBIN TOTAL: 0.6 mg/dL (ref 0.0–1.2)
BUN / CREAT RATIO: 16 (ref 10–24)
BUN: 12 mg/dL (ref 8–27)
CHLORIDE: 100 mmol/L (ref 96–106)
CO2: 23 mmol/L (ref 18–29)
Calcium: 8.7 mg/dL (ref 8.6–10.2)
Creatinine, Ser: 0.77 mg/dL (ref 0.76–1.27)
GFR calc Af Amer: 102 mL/min/{1.73_m2} (ref >59)
GFR calc non Af Amer: 88 mL/min/{1.73_m2} (ref >59)
GLUCOSE: 98 mg/dL (ref 65–99)
Globulin, Total: 2.3 g/dL (ref 1.5–4.5)
Potassium: 4.2 mmol/L (ref 3.5–5.2)
Sodium: 139 mmol/L (ref 134–144)
Total Protein: 6.3 g/dL (ref 6.0–8.5)

## 2017-03-28 LAB — LIPID PANEL
Chol/HDL Ratio: 2.9 ratio (ref 0.0–5.0)
Cholesterol, Total: 108 mg/dL (ref 100–199)
HDL: 37 mg/dL — AB (ref >39)
LDL Calculated: 59 mg/dL (ref 0–99)
TRIGLYCERIDES: 59 mg/dL (ref 0–149)
VLDL CHOLESTEROL CAL: 12 mg/dL (ref 5–40)

## 2017-03-28 MED ORDER — ATORVASTATIN CALCIUM 80 MG PO TABS: 80.0000 mg | ORAL_TABLET | Freq: Every day | ORAL | 3 refills | Status: DC

## 2017-03-28 MED ORDER — NITROGLYCERIN 0.4 MG SL SUBL: 0.4000 mg | SUBLINGUAL_TABLET | SUBLINGUAL | 6 refills | Status: DC | PRN

## 2017-03-28 NOTE — Progress Notes (Signed)
Cardiology Office Note   Date:  03/28/2017   ID:  Anthony Carpenter, DOB November 14, 1941, MRN [id number]  PCP:  Marinda Elk, MD  Cardiologist:   Kristeen Miss, MD   Chief Complaint  Patient presents with  . Coronary Artery Disease  . Hypertension  . Hyperlipidemia    1. Coronary artery disease-status post PTCA and stenting of his mid LAD.  ( February 11, 2015)    We placed a 2.75 x 20 mm Taxus stent. It was post dilated using a 3.0 Quantum Maverick, 07/28/05  2. Hyperlipidemia-   3. Possible bicuspid aortic valve   Anthony Carpenter is doing well. No chest pain or dyspnea. He plans on putting in a garden this summer . He's building his house this year. He was admitted to the hospital with some chest pain/abdominal pain. A Myoview study did not reveal any ischemia.  He has felt better. No further episodes of chest pain. He forgot to take his meds this am.  Feb. 26, 2014: Anthony Carpenter is doing well. He has not had any angina. He remains active - still building houses.   July 14, 2013:  Anthony Carpenter is doing Well. No angina.   July 14, 2014:  Anthony Carpenter is doing well. Still working No CP, no dyspnea.   Dec. 11, 2015;  Anthony Carpenter was in the hospital for CP recently. He was having vague chest discomfort. Cath showed a 60 % instent restenosis. Pains have resolved. He is back doing all of his normal activities. BP has been lower   February 05, 2015:  Anthony Carpenter is a 76 y.o. male who presents for some CP and DOE for the past month. 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 Will last as long as he's working . Eases up if he stops.   March 17, 2015:  Anthony Carpenter is a 76 y.o. male who presents for follow up of an episode of unstable angina.  I saw him in March for symptoms worisome of UAP.  CAth showed.   Left main: No obstructive disease.   Left Anterior Descending Artery: Large caliber vessel that courses to the apex. The mid segment is stented. The old stent  has 99% restenosis. The distal vessel is free of obstructive disease. The Diagonal branch is moderate in caliber with ostial 30% stenosis where the vessel is jailed by the LAD stent.  Circumflex Artery: Large caliber vessel with moderate caliber obtuse marginal branch. The obtuse marginal branch has proximal 50% stenosis.  Right Coronary Artery: Large dominant vessel with 30% mid stenosis.  Left Ventricular Angiogram: LVEF=45-50% with hypokinesis of the anterior wall and apex.  He had 3.0 x 16 mm Promus Premier DES in the mid LAD. The stent was post-dilated with a 3.25 x 12 mm [place] balloon x 1. The stenosis was taken from 99% down to 0%.   He is feeling much better.   Not shortness of breath   Participating in cardiac rehab. Bruising from the plavix.   But no real PC   Oct. 24, 2016:  Anthony Carpenter is doing well. Walking regularly  Has several projects doing on. Developing some land  Has some bruising and occasional nose bleeds.   March 13, 2016:  Staying active.    Occasional nose bleeds   Oct. 23, 2017:  Doing well .  No CP or dyspnea. Cooked 80 gallons of [place] stew this weekend  Has occasional nose bleed   Mar 28, 2017:  Doing well.   No  angina Working on his housing development    Past Medical History:  Diagnosis Date  . Aortic stenosis, mild   . Bicuspid aortic valve   . Bradycardia   . Carotid bruit present    a. Right side  . Coronary artery disease    a. CAD s/p PTCA/Taxus stent to mid-prox LAD 2006  b. cath 10/02/14 with 60% focal instent restenosis in LAD prox to the Diag takeoff, 30% mid AV groove left circ stenosis and luminal irregularities in a large dominant RCA. FFR of LAD was 0.85 and not felt to be HD significant to warrant PCI.  Marland Kitchen Hyperlipidemia   . Hypertension   . Myocardial infarction (HCC) 2006  . Pulmonary nodule seen on imaging study    a. seen on CT during 09/2014 admission, may require follow up imaging     Past Surgical History:  Procedure  Laterality Date  . CARDIAC CATHETERIZATION  07/28/2005   Est EF of 55% -- Single-vessel obstructive disease in the proximal/mid left anterior descending -- Preserved left ventricular systolic function with good anterior wall motion  . cataracts    . COLONOSCOPY  ? 2009  . CORONARY ANGIOPLASTY WITH STENT PLACEMENT  07/28/2005   Successful PTCA and stenting of the mid and proximal left anterior descending artery.  The patient is stable leaving the catheterization laboratory -- Vesta Mixer, M.D.    . CORONARY STENT PLACEMENT  02/11/2015   3.0 x 16 mm Promus Premier DES to the mLAD for ISR  . LEFT HEART CATHETERIZATION WITH CORONARY ANGIOGRAM N/A 10/02/2014   Procedure: LEFT HEART CATHETERIZATION WITH CORONARY ANGIOGRAM;  Surgeon: Lennette Bihari, MD;  Location: [facility] CATH LAB;  Service: Cardiovascular;  Laterality: N/A;  . LEFT HEART CATHETERIZATION WITH CORONARY ANGIOGRAM N/A 02/11/2015   Procedure: LEFT HEART CATHETERIZATION WITH CORONARY ANGIOGRAM;  Surgeon: Kathleene Hazel, MD; LAD 99% ISR, CFX okay, OM1 50%, RCA 30%, D1 30% (jailed by the LAD stent), EF 45-50 %     Current Outpatient Prescriptions  Medication Sig Dispense Refill  . atorvastatin (LIPITOR) 80 MG tablet Take 1 tablet (80 mg total) by mouth daily. 90 tablet 3  . clopidogrel (PLAVIX) 75 MG tablet Take 1 tablet (75 mg total) by mouth daily with breakfast. 90 tablet 1  . dorzolamide-timolol (COSOPT) 22.3-6.8 MG/ML ophthalmic solution Place 1 drop into both eyes 2 (two) times daily.     . isosorbide mononitrate (IMDUR) 30 MG 24 hr tablet Take 1 tablet (30 mg total) by mouth daily. 90 tablet 1  . latanoprost (XALATAN) 0.005 % ophthalmic solution Place 1 drop into both eyes at bedtime.     Marland Kitchen lisinopril (PRINIVIL,ZESTRIL) 10 MG tablet Take 1 tablet (10 mg total) by mouth daily. 90 tablet 2  . Multiple Vitamin (MULITIVITAMIN WITH MINERALS) TABS Take 1 tablet by mouth daily.    . Multiple Vitamins-Minerals (ICAPS AREDS FORMULA  PO) Take by mouth daily. Patient reports taking 2 tablets daily    . nitroGLYCERIN (NITROSTAT) 0.4 MG SL tablet Place 1 tablet (0.4 mg total) under the tongue every 5 (five) minutes as needed. For chest pain. 25 tablet 0  . omega-3 acid ethyl esters (LOVAZA) 1 g capsule Take 2 capsules (2 g total) by mouth daily. 180 capsule 3   No current facility-administered medications for this visit.     Allergies:   Patient has no known allergies.    Social History:  The patient  reports that he has never smoked. He has never used  smokeless tobacco. He reports that he does not drink alcohol or use drugs.   Family History:  The patient's family history includes Lung cancer in his father.    ROS:  Please see the history of present illness.    Review of Systems: Constitutional:  denies fever, chills, diaphoresis, appetite change and fatigue.  HEENT: denies photophobia, eye pain, redness, hearing loss, ear pain, congestion, sore throat, rhinorrhea, sneezing, neck pain, neck stiffness and tinnitus.  Respiratory: denies SOB, DOE, cough, chest tightness, and wheezing.  Cardiovascular: denies chest pain, palpitations and leg swelling.  Gastrointestinal: denies nausea, vomiting, abdominal pain, diarrhea, constipation, blood in stool.  Genitourinary: denies dysuria, urgency, frequency, hematuria, flank pain and difficulty urinating.  Musculoskeletal: denies  myalgias, back pain, joint swelling, arthralgias and gait problem.   Skin: denies pallor, rash and wound.  Neurological: denies dizziness, seizures, syncope, weakness, light-headedness, numbness and headaches.   Hematological: denies adenopathy, easy bruising, personal or family bleeding history.  Psychiatric/ Behavioral: denies suicidal ideation, mood changes, confusion, nervousness, sleep disturbance and agitation.       All other systems are reviewed and negative.    PHYSICAL EXAM: VS:  BP 97/62   Pulse (!) 45   Ht 5\' 10"  (1.778 m)   Wt  199 lb 12.8 oz (90.6 kg)   BMI 28.67 kg/m  , BMI Body mass index is 28.67 kg/m. GEN: Well nourished, well developed, in no acute distress  HEENT: normal  Neck: no JVD, carotid bruits, or masses Cardiac: RRR; no murmurs, rubs, or gallops,no edema  Respiratory:  clear to auscultation bilaterally, normal work of breathing GI: soft, nontender, nondistended, + BS MS: no deformity or atrophy, right radial cath site looks great , pulses are intact Skin: warm and dry, no rash Neuro:  Strength and sensation are intact Psych: normal   EKG:  EKG is ordered today. Sinus brady at 45  with 1st degree AV block     Recent Labs: 09/11/2016: ALT 13; BUN 11; Creat 0.95; Potassium 3.9; Sodium 141    Lipid Panel    Component Value Date/Time   CHOL 101 (L) 09/11/2016 0904   TRIG 68 09/11/2016 0904   HDL 36 (L) 09/11/2016 0904   CHOLHDL 2.8 09/11/2016 0904   VLDL 14 09/11/2016 0904   LDLCALC 51 09/11/2016 0904      Wt Readings from Last 3 Encounters:  03/28/17 199 lb 12.8 oz (90.6 kg)  09/11/16 204 lb 6.4 oz (92.7 kg)  03/13/16 212 lb 12.8 oz (96.5 kg)      Other studies Reviewed: Additional studies/ records that were reviewed today include: . Review of the above records demonstrates:    ASSESSMENT AND PLAN:   1. Coronary artery disease-status post PTCA and stenting of his mid LAD. We placed a 2.75 x 20 mm Taxus stent,  Now with a 3.0 x 16 mm Promus Premier DES in the mid LAD. The stent was post-dilated with a 3.25 x 12 mm [place] balloon.   he's doing very well. He's not had any episodes of chest pain. Will DC ASA ( nose bleeds ) and continue plavix.   Ill see him in 6 months    2. Hyperlipidemia-will check labs today     Current medicines are reviewed at length with the patient today.  The patient does not have concerns regarding medicines.  The following changes have been made:  no change  Labs/ tests ordered today include:  No orders of the defined types were placed in this  encounter.   Disposition:   FU with me in 6 months      Signed, Kristeen MissPhilip Whitfield Dulay, MD  03/28/2017 9:54 AM    [facility] [address], [place], [place]  [address] Phone: [phone]; Fax: [phone]

## 2017-03-28 NOTE — Patient Instructions (Signed)
Medication Instructions:  STOP Imdur (Isosorbide mononitrate)   Labwork: TODAY - cholesterol, complete metabolic panel   Testing/Procedures: None Ordered   Follow-Up: Your physician wants you to follow-up in: 6 months with Dr. Elease HashimotoNahser.  You will receive a reminder letter in the mail two months in advance. If you don't receive a letter, please call our office to schedule the follow-up appointment.   If you need a refill on your cardiac medications before your next appointment, please call your pharmacy.   Thank you for choosing [facility]! Eligha BridegroomMichelle Swinyer, RN [phone]

## 2017-09-21 ENCOUNTER — Encounter: Payer: Self-pay | Admitting: Cardiovascular Disease

## 2017-10-04 ENCOUNTER — Ambulatory Visit: Payer: Medicare Other | Admitting: Cardiovascular Disease

## 2017-10-04 ENCOUNTER — Encounter: Payer: Self-pay | Admitting: Cardiovascular Disease

## 2017-10-04 VITALS — BP 122/60 | HR 48 | Ht 70.0 in | Wt 201.6 lb

## 2017-10-04 DIAGNOSIS — I350 Nonrheumatic aortic (valve) stenosis: Secondary | ICD-10-CM | POA: Diagnosis not present

## 2017-10-04 DIAGNOSIS — I2510 Atherosclerotic heart disease of native coronary artery without angina pectoris: Secondary | ICD-10-CM

## 2017-10-04 DIAGNOSIS — R238 Other skin changes: Secondary | ICD-10-CM

## 2017-10-04 DIAGNOSIS — R233 Spontaneous ecchymoses: Secondary | ICD-10-CM

## 2017-10-04 LAB — BASIC METABOLIC PANEL
BUN/Creatinine Ratio: 12 (ref 10–24)
BUN: 11 mg/dL (ref 8–27)
CO2: 25 mmol/L (ref 20–29)
CREATININE: 0.93 mg/dL (ref 0.76–1.27)
Calcium: 9.1 mg/dL (ref 8.6–10.2)
Chloride: 105 mmol/L (ref 96–106)
GFR, EST AFRICAN AMERICAN: 91 mL/min/{1.73_m2} (ref >59)
GFR, EST NON AFRICAN AMERICAN: 79 mL/min/{1.73_m2} (ref >59)
Glucose: 100 mg/dL — ABNORMAL HIGH (ref 65–99)
Potassium: 4.2 mmol/L (ref 3.5–5.2)
SODIUM: 142 mmol/L (ref 134–144)

## 2017-10-04 LAB — LIPID PANEL
CHOL/HDL RATIO: 2.8 ratio (ref 0.0–5.0)
Cholesterol, Total: 108 mg/dL (ref 100–199)
HDL: 38 mg/dL — AB (ref >39)
LDL CALC: 59 mg/dL (ref 0–99)
TRIGLYCERIDES: 54 mg/dL (ref 0–149)
VLDL CHOLESTEROL CAL: 11 mg/dL (ref 5–40)

## 2017-10-04 LAB — HEPATIC FUNCTION PANEL
ALK PHOS: 75 IU/L (ref 39–117)
ALT: 19 IU/L (ref 0–44)
AST: 19 IU/L (ref 0–40)
Albumin: 4.2 g/dL (ref 3.5–4.8)
Bilirubin Total: 0.8 mg/dL (ref 0.0–1.2)
Bilirubin, Direct: 0.2 mg/dL (ref 0.00–0.40)
Total Protein: 6.6 g/dL (ref 6.0–8.5)

## 2017-10-04 LAB — CBC
HEMATOCRIT: 42.9 % (ref 37.5–51.0)
Hemoglobin: 14.4 g/dL (ref 13.0–17.7)
MCH: 29.9 pg (ref 26.6–33.0)
MCHC: 33.6 g/dL (ref 31.5–35.7)
MCV: 89 fL (ref 79–97)
PLATELETS: 152 10*3/uL (ref 150–379)
RBC: 4.81 x10E6/uL (ref 4.14–5.80)
RDW: 13.9 % (ref 12.3–15.4)
WBC: 6.3 10*3/uL (ref 3.4–10.8)

## 2017-10-04 NOTE — Patient Instructions (Signed)
Medication Instructions:  Your physician recommends that you continue on your current medications as directed. Please refer to the Current Medication list given to you today.   Labwork: TODAY - cholesterol, basic metabolic panel, CBC, liver panel   Testing/Procedures: Your physician has requested that you have an echocardiogram. Echocardiography is a painless test that uses sound waves to create images of your heart. It provides your doctor with information about the size and shape of your heart and how well your heart's chambers and valves are working. This procedure takes approximately one hour. There are no restrictions for this procedure.   Follow-Up: Your physician wants you to follow-up in: 6 months with Dr. Elease HashimotoNahser.  You will receive a reminder letter in the mail two months in advance. If you don't receive a letter, please call our office to schedule the follow-up appointment.   If you need a refill on your cardiac medications before your next appointment, please call your pharmacy.   Thank you for choosing [facility]! Eligha BridegroomMichelle Baelynn Schmuhl, RN [phone]

## 2017-10-04 NOTE — Progress Notes (Signed)
Cardiology Office Note   Date:  10/04/2017   ID:  Anthony Carpenter, DOB 02/08/1941, MRN [id number]  PCP:  Marinda ElkFried, Robert, MD  Cardiologist:   Kristeen MissPhilip Anastasios Melander, MD   Chief Complaint  Patient presents with  . Coronary Artery Disease    1. Coronary artery disease-status post PTCA and stenting of his mid LAD.  ( February 11, 2015)    We placed a 2.75 x 20 mm Taxus stent. It was post dilated using a 3.0 Quantum Maverick, 07/28/05  2. Hyperlipidemia-   3. Possible bicuspid aortic valve   Anthony Carpenter is doing well. No chest pain or dyspnea. He plans on putting in a garden this summer . He's building his house this year. He was admitted to the hospital with some chest pain/abdominal pain. A Myoview study did not reveal any ischemia.  He has felt better. No further episodes of chest pain. He forgot to take his meds this am.  Feb. 26, 2014: Anthony Carpenter is doing well. He has not had any angina. He remains active - still building houses.   July 14, 2013:  Anthony Carpenter is doing Well. No angina.   July 14, 2014:  Anthony Carpenter is doing well. Still working No CP, no dyspnea.   Dec. 11, 2015;  Anthony Carpenter was in the hospital for CP recently. He was having vague chest discomfort. Cath showed a 60 % instent restenosis. Pains have resolved. He is back doing all of his normal activities. BP has been lower   February 05, 2015:  Anthony Carpenter is a 76 y.o. male who presents for some CP and DOE for the past month. 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 Will last as long as he's working . Eases up if he stops.   March 17, 2015:  Anthony Carpenter is a 76 y.o. male who presents for follow up of an episode of unstable angina.  I saw him in March for symptoms worisome of UAP.  CAth showed.   Left main: No obstructive disease.   Left Anterior Descending Artery: Large caliber vessel that courses to the apex. The mid segment is stented. The old stent has 99% restenosis. The distal  vessel is free of obstructive disease. The Diagonal branch is moderate in caliber with ostial 30% stenosis where the vessel is jailed by the LAD stent.  Circumflex Artery: Large caliber vessel with moderate caliber obtuse marginal branch. The obtuse marginal branch has proximal 50% stenosis.  Right Coronary Artery: Large dominant vessel with 30% mid stenosis.  Left Ventricular Angiogram: LVEF=45-50% with hypokinesis of the anterior wall and apex.  He had 3.0 x 16 mm Promus Premier DES in the mid LAD. The stent was post-dilated with a 3.25 x 12 mm [place] balloon x 1. The stenosis was taken from 99% down to 0%.   He is feeling much better.   Not shortness of breath   Participating in cardiac rehab. Bruising from the plavix.   But no real PC   Oct. 24, 2016:  Anthony Carpenter is doing well. Walking regularly  Has several projects doing on. Developing some land  Has some bruising and occasional nose bleeds.   March 13, 2016:  Staying active.    Occasional nose bleeds   Oct. 23, 2017:  Doing well .  No CP or dyspnea. Cooked 80 gallons of [place] stew this weekend  Has occasional nose bleed   Mar 28, 2017:  Doing well.   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   Has lots of bruising but not so much nosebleeding. We have stopped the ASA and continued the plavix.    Past Medical History:  Diagnosis Date  . Aortic stenosis, mild   . Bicuspid aortic valve   . Bradycardia   . Carotid bruit present    a. Right side  . Coronary artery disease    a. CAD s/p PTCA/Taxus stent to mid-prox LAD 2006  b. cath 10/02/14 with 60% focal instent restenosis in LAD prox to the Diag takeoff, 30% mid AV groove left circ stenosis and luminal irregularities in a large dominant RCA. FFR of LAD was 0.85 and not felt to be HD significant to warrant PCI.  Marland Kitchen. Hyperlipidemia   . Hypertension   . Myocardial infarction (HCC) 2006  . Pulmonary nodule seen on imaging study     a. seen on CT during 09/2014 admission, may require follow up imaging     Past Surgical History:  Procedure Laterality Date  . CARDIAC CATHETERIZATION  07/28/2005   Est EF of 55% -- Single-vessel obstructive disease in the proximal/mid left anterior descending -- Preserved left ventricular systolic function with good anterior wall motion  . cataracts    . COLONOSCOPY  ? 2009  . CORONARY ANGIOPLASTY WITH STENT PLACEMENT  07/28/2005   Successful PTCA and stenting of the mid and proximal left anterior descending artery.  The patient is stable leaving the catheterization laboratory -- Vesta MixerPhilip J. N[employer]ef [employer], M.D.    . CORONARY STENT PLACEMENT  02/11/2015   3.0 x 16 mm Promus Premier DES to the mLAD for ISR  . LEFT HEART CATHETERIZATION WITH CORONARY ANGIOGRAM N/A 10/02/2014   Procedure: LEFT HEART CATHETERIZATION WITH CORONARY ANGIOGRAM;  Surgeon: Lennette Biharihomas A Kelly, MD;  [facility];  Service: Cardiovascular;  Laterality: N/A;  . LEFT HEART CATHETERIZATION WITH CORONARY ANGIOGRAM N/A 02/11/2015   Procedure: LEFT HEART CATHETERIZATION WITH CORONARY ANGIOGRAM;  Surgeon: Kathleene Hazelhristopher D McAlhany, MD; LAD 99% ISR, CFX okay, OM1 50%, RCA 30%, D1 30% (jailed by the LAD stent), EF 45-50 %     Current Outpatient Medications  Medication Sig Dispense Refill  . atorvastatin (LIPITOR) 80 MG tablet Take 1 tablet (80 mg total) by mouth daily. 90 tablet 3  . clopidogrel (PLAVIX) 75 MG tablet Take 1 tablet (75 mg total) by mouth daily with breakfast. 90 tablet 1  . dorzolamide-timolol (COSOPT) 22.3-6.8 MG/ML ophthalmic solution Place 1 drop into both eyes 2 (two) times daily.     Marland Kitchen. latanoprost (XALATAN) 0.005 % ophthalmic solution Place 1 drop into both eyes at bedtime.     Marland Kitchen. lisinopril (PRINIVIL,ZESTRIL) 10 MG tablet Take 1 tablet (10 mg total) by mouth daily. 90 tablet 2  . Multiple Vitamin (MULITIVITAMIN WITH MINERALS) TABS Take 1 tablet by mouth daily.    . Multiple Vitamins-Minerals (ICAPS AREDS FORMULA  PO) Take by mouth daily. Patient reports taking 2 tablets daily    . nitroGLYCERIN (NITROSTAT) 0.4 MG SL tablet Place 1 tablet (0.4 mg total) under the tongue every 5 (five) minutes as needed. For chest pain. 25 tablet 6  . omega-3 acid ethyl esters (LOVAZA) 1 g capsule Take 2 capsules (2 g total) by mouth daily. 180 capsule 3   No current facility-administered medications for this visit.     Allergies:   Patient has no known allergies.    Social History:  The patient  reports that  has never smoked. he has never used smokeless  tobacco. He reports that he does not drink alcohol or use drugs.   Family History:  The patient's family history includes Cancer in his unknown relative; Lung cancer in his father.    ROS:  Please see the history of present illness.      All other systems are reviewed and negative.   Physical Exam: Blood pressure 122/60, pulse (!) 48, height 5\' 10"  (1.778 m), weight 201 lb 9.6 oz (91.4 kg), SpO2 97 %.  GEN:  Well nourished, well developed in no acute distress HEENT: Normal NECK: No JVD; No carotid bruits LYMPHATICS: No lymphadenopathy CARDIAC: RR, no murmurs, rubs, gallops RESPIRATORY:  Clear to auscultation without rales, wheezing or rhonchi  ABDOMEN: Soft, non-tender, non-distended MUSCULOSKELETAL:  No edema; No deformity  SKIN: Warm and dry NEUROLOGIC:  Alert and oriented x 3    EKG:      Recent Labs: 03/28/2017: ALT 16; BUN 12; Creatinine, Ser 0.77; Potassium 4.2; Sodium 139    Lipid Panel    Component Value Date/Time   CHOL 108 03/28/2017 1033   TRIG 59 03/28/2017 1033   HDL 37 (L) 03/28/2017 1033   CHOLHDL 2.9 03/28/2017 1033   CHOLHDL 2.8 09/11/2016 0904   VLDL 14 09/11/2016 0904   LDLCALC 59 03/28/2017 1033      Wt Readings from Last 3 Encounters:  10/04/17 201 lb 9.6 oz (91.4 kg)  03/28/17 199 lb 12.8 oz (90.6 kg)  09/11/16 204 lb 6.4 oz (92.7 kg)      Other studies Reviewed: Additional studies/ records that were reviewed  today include: . Review of the above records demonstrates:    ASSESSMENT AND PLAN:   1. Coronary artery disease-status post PTCA and stenting of his mid LAD. We placed a 2.75 x 20 mm Taxus stent,  Now with a 3.0 x 16 mm Promus Premier DES in the mid LAD.    No angina.  He is having lots of bruising.  We discussed the possibility of stopping the Plavix and restarting aspirin.  I told him that if the bruising got to be really significant and standing in this way of doing his normal activities then we probably should change the Plavix to aspirin.  If the bruising is only a nuisance then I would suggest we continue with Plavix.  We will check a platelet count today.  Continue medications for now.  Ill see him in 6 months    2. Hyperlipidemia-  Check lipids, liver enz and BMP    3.  Mild aortic stenosis:   Will repeat echo    Current medicines are reviewed at length with the patient today.  The patient does not have concerns regarding medicines.  The following changes have been made:  no change  Labs/ tests ordered today include:  No orders of the defined types were placed in this encounter.   Disposition:   FU with me in 6 months      Signed, Kristeen Miss, MD  10/04/2017 9:51 AM    [facility] [address] [place], [place], [place]  [address] Phone: [phone]; Fax: [phone]

## 2017-10-15 ENCOUNTER — Ambulatory Visit (HOSPITAL_COMMUNITY): Payer: Medicare Other | Attending: Cardiovascular Disease

## 2017-10-15 ENCOUNTER — Other Ambulatory Visit: Payer: Self-pay

## 2017-10-15 DIAGNOSIS — R238 Other skin changes: Secondary | ICD-10-CM

## 2017-10-15 DIAGNOSIS — I060 Rheumatic aortic stenosis: Secondary | ICD-10-CM | POA: Insufficient documentation

## 2017-10-15 DIAGNOSIS — I420 Dilated cardiomyopathy: Secondary | ICD-10-CM | POA: Diagnosis not present

## 2017-10-15 DIAGNOSIS — R233 Spontaneous ecchymoses: Secondary | ICD-10-CM

## 2017-10-15 DIAGNOSIS — I2510 Atherosclerotic heart disease of native coronary artery without angina pectoris: Secondary | ICD-10-CM | POA: Diagnosis not present

## 2017-10-15 DIAGNOSIS — I350 Nonrheumatic aortic (valve) stenosis: Secondary | ICD-10-CM

## 2017-10-22 ENCOUNTER — Other Ambulatory Visit: Payer: Self-pay | Admitting: Cardiovascular Disease

## 2017-10-22 DIAGNOSIS — E785 Hyperlipidemia, unspecified: Secondary | ICD-10-CM

## 2017-10-22 DIAGNOSIS — E782 Mixed hyperlipidemia: Secondary | ICD-10-CM

## 2017-11-12 ENCOUNTER — Telehealth: Payer: Self-pay | Admitting: Cardiovascular Disease

## 2017-11-12 NOTE — Telephone Encounter (Signed)
Spoke with pt, he is having nose bleeds on a daily basis. He is able to get them stopped but it takes a while. Discussed the patient using saline nasal spray and humidifier in the home. Will forward to dr Elease Hashimotonahser, there is mention in the last ov note the possibility of stopping plavix and switching to aspirin. Pt agreed with this plan.

## 2017-11-12 NOTE — Telephone Encounter (Signed)
Patient calling, states that he would like to discuss his medication.

## 2017-11-14 NOTE — Telephone Encounter (Signed)
Spoke with pt, he is doing better with no problems for the last 2 days. He is going to see how it goes the rest of the week and if has problems he will let us know.

## 2017-11-14 NOTE — Telephone Encounter (Signed)
Agree with the suggestions from Deliah Goodyebra Mathis, RN. If the nosebleeds continue, I would DC Plavix and restart ASA 81 mg a day

## 2018-01-15 ENCOUNTER — Telehealth: Payer: Self-pay

## 2018-01-15 NOTE — Telephone Encounter (Signed)
**Note De-Identified Sanchez Hemmer Obfuscation** Reviewed with Marcelino DusterMichelle, Dr Harvie BridgeNahser's nurse, and the pts triglycerides have never been 500 or greater. Answered no on form and faxed it back to [facility].

## 2018-01-15 NOTE — Telephone Encounter (Signed)
Received a form from [facility] with one question to complete. Question: Was the pts triglyceride level greater than or equal to 500 mg/dl. Answer: No  According to the pts chart his triglyceride have never been that high.

## 2018-01-16 NOTE — Telephone Encounter (Signed)
Received another form from [employer] to complete on the pts Omega-3 coverage determination. I have completed the form and faxed it back to [employer].

## 2018-01-29 NOTE — Telephone Encounter (Signed)
The pt called the office stating that he received a letter from his insurance company stating that they are denying coverage on Omega-3 because his Triglycerides are not at 500 or greater.  The pts Triglyceride level on 10/04/17 was 54.  He wants to know what Dr Elease HashimotoNahser wants him to do.  He is aware that I am forwarding this message to Dr Elease HashimotoNahser for advisement.

## 2018-01-30 NOTE — Telephone Encounter (Signed)
Spoke with patient and advised him that he may d/c Lovaza when he completes his current Rx. I advised him that it is not harmful to continue, but there is not enough clinical evidence to show that he should pay a large amount out of pocket for the medication. He verbalized understanding and agreement with plan and thanked me for the call.

## 2018-01-30 NOTE — Telephone Encounter (Signed)
He may DC if he would like  Seems to not be as beneficial as we once thought

## 2018-04-27 ENCOUNTER — Other Ambulatory Visit: Payer: Self-pay | Admitting: Cardiovascular Disease

## 2018-05-16 ENCOUNTER — Ambulatory Visit (HOSPITAL_BASED_OUTPATIENT_CLINIC_OR_DEPARTMENT_OTHER)
Admission: RE | Admit: 2018-05-16 | Discharge: 2018-05-16 | Disposition: A | Discharge status: Home / Self Care | Payer: Medicare Other | Source: Ambulatory Visit | Attending: Family Medicine | Admitting: Family Medicine

## 2018-05-16 ENCOUNTER — Other Ambulatory Visit: Payer: Self-pay | Admitting: Family Medicine

## 2018-05-16 DIAGNOSIS — I861 Scrotal varices: Secondary | ICD-10-CM | POA: Diagnosis not present

## 2018-05-16 DIAGNOSIS — N50811 Right testicular pain: Secondary | ICD-10-CM | POA: Diagnosis present

## 2018-05-20 ENCOUNTER — Ambulatory Visit: Payer: Medicare Other | Admitting: Cardiovascular Disease

## 2018-05-20 ENCOUNTER — Encounter (INDEPENDENT_AMBULATORY_CARE_PROVIDER_SITE_OTHER): Payer: Self-pay

## 2018-05-20 ENCOUNTER — Encounter: Payer: Self-pay | Admitting: Cardiovascular Disease

## 2018-05-20 VITALS — BP 110/64 | HR 48 | Ht 70.0 in | Wt 202.0 lb

## 2018-05-20 DIAGNOSIS — E782 Mixed hyperlipidemia: Secondary | ICD-10-CM | POA: Diagnosis not present

## 2018-05-20 DIAGNOSIS — I2510 Atherosclerotic heart disease of native coronary artery without angina pectoris: Secondary | ICD-10-CM | POA: Diagnosis not present

## 2018-05-20 LAB — HEPATIC FUNCTION PANEL
ALK PHOS: 75 IU/L (ref 39–117)
ALT: 17 IU/L (ref 0–44)
AST: 19 IU/L (ref 0–40)
Albumin: 4.1 g/dL (ref 3.5–4.8)
BILIRUBIN, DIRECT: 0.21 mg/dL (ref 0.00–0.40)
Bilirubin Total: 0.6 mg/dL (ref 0.0–1.2)
TOTAL PROTEIN: 6.5 g/dL (ref 6.0–8.5)

## 2018-05-20 LAB — LIPID PANEL
Chol/HDL Ratio: 2.7 ratio (ref 0.0–5.0)
Cholesterol, Total: 110 mg/dL (ref 100–199)
HDL: 41 mg/dL (ref >39)
LDL Calculated: 57 mg/dL (ref 0–99)
TRIGLYCERIDES: 62 mg/dL (ref 0–149)
VLDL CHOLESTEROL CAL: 12 mg/dL (ref 5–40)

## 2018-05-20 LAB — BASIC METABOLIC PANEL
BUN / CREAT RATIO: 12 (ref 10–24)
BUN: 11 mg/dL (ref 8–27)
CO2: 23 mmol/L (ref 20–29)
CREATININE: 0.93 mg/dL (ref 0.76–1.27)
Calcium: 9.3 mg/dL (ref 8.6–10.2)
Chloride: 105 mmol/L (ref 96–106)
GFR, EST AFRICAN AMERICAN: 91 mL/min/{1.73_m2} (ref >59)
GFR, EST NON AFRICAN AMERICAN: 78 mL/min/{1.73_m2} (ref >59)
GLUCOSE: 92 mg/dL (ref 65–99)
Potassium: 4.2 mmol/L (ref 3.5–5.2)
SODIUM: 142 mmol/L (ref 134–144)

## 2018-05-20 NOTE — Patient Instructions (Signed)

## 2018-05-20 NOTE — Progress Notes (Signed)
Cardiology Office Note   Date:  05/20/2018   ID:  Anthony Carpenter, DOB 1940-12-24, MRN [id number]  PCP:  Joycelyn Rua, MD  Cardiologist:   Kristeen Miss, MD   Chief Complaint  Patient presents with  . Coronary Artery Disease    1. Coronary artery disease-status post PTCA and stenting of his mid LAD.  ( February 11, 2015)    We placed a 2.75 x 20 mm Taxus stent. It was post dilated using a 3.0 Quantum Maverick, 07/28/05  2. Hyperlipidemia-   3. Possible bicuspid aortic valve   Anthony Carpenter is doing well. No chest pain or dyspnea. He plans on putting in a garden this summer . He's building his house this year. He was admitted to the hospital with some chest pain/abdominal pain. A Myoview study did not reveal any ischemia.  He has felt better. No further episodes of chest pain. He forgot to take his meds this am.  Feb. 26, 2014: Anthony Carpenter is doing well. He has not had any angina. He remains active - still building houses.   July 14, 2013:  Anthony Carpenter is doing Well. No angina.   July 14, 2014:  Anthony Carpenter is doing well. Still working No CP, no dyspnea.   Dec. 11, 2015;  Anthony Carpenter was in the hospital for CP recently. He was having vague chest discomfort. Cath showed a 60 % instent restenosis. Pains have resolved. He is back doing all of his normal activities. BP has been lower   February 05, 2015:  Anthony Carpenter is a 77 y.o. male who presents for some CP and DOE for the past month. 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 Will last as long as he's working . Eases up if he stops.   March 17, 2015:  Anthony Carpenter is a 77 y.o. male who presents for follow up of an episode of unstable angina.  I saw him in March for symptoms worisome of UAP.  CAth showed.   Left main: No obstructive disease.   Left Anterior Descending Artery: Large caliber vessel that courses to the apex. The mid segment is stented. The old stent has 99% restenosis. The distal  vessel is free of obstructive disease. The Diagonal branch is moderate in caliber with ostial 30% stenosis where the vessel is jailed by the LAD stent.  Circumflex Artery: Large caliber vessel with moderate caliber obtuse marginal branch. The obtuse marginal branch has proximal 50% stenosis.  Right Coronary Artery: Large dominant vessel with 30% mid stenosis.  Left Ventricular Angiogram: LVEF=45-50% with hypokinesis of the anterior wall and apex.  He had 3.0 x 16 mm Promus Premier DES in the mid LAD. The stent was post-dilated with a 3.25 x 12 mm [place] balloon x 1. The stenosis was taken from 99% down to 0%.   He is feeling much better.   Not shortness of breath   Participating in cardiac rehab. Bruising from the plavix.   But no real PC   Oct. 24, 2016:  Anthony Carpenter is doing well. Walking regularly  Has several projects doing on. Developing some land  Has some bruising and occasional nose bleeds.   March 13, 2016:  Staying active.    Occasional nose bleeds   Oct. 23, 2017:  Doing well .  No CP or dyspnea. Cooked 80 gallons of [place] stew this weekend  Has occasional nose bleed   Mar 28, 2017:  Doing well.   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   Has lots of bruising but not so much nosebleeding. We have stopped the ASA and continued the plavix.    May 20, 2018: Anthony Carpenter is seen today for follow-up of his coronary artery disease and hyperlipidemia No CP ,  No dyspnea.   Still bruising.   Is still on Plavix,   Is off ASA  No recent nose bleeds  Past Medical History:  Diagnosis Date  . Aortic stenosis, mild   . Bicuspid aortic valve   . Bradycardia   . Carotid bruit present    a. Right side  . Coronary artery disease    a. CAD s/p PTCA/Taxus stent to mid-prox LAD 2006  b. cath 10/02/14 with 60% focal instent restenosis in LAD prox to the Diag takeoff, 30% mid AV groove left circ stenosis and luminal irregularities in a large  dominant RCA. FFR of LAD was 0.85 and not felt to be HD significant to warrant PCI.  Marland Kitchen Hyperlipidemia   . Hypertension   . Myocardial infarction (HCC) 2006  . Pulmonary nodule seen on imaging study    a. seen on CT during 09/2014 admission, may require follow up imaging     Past Surgical History:  Procedure Laterality Date  . CARDIAC CATHETERIZATION  07/28/2005   Est EF of 55% -- Single-vessel obstructive disease in the proximal/mid left anterior descending -- Preserved left ventricular systolic function with good anterior wall motion  . cataracts    . COLONOSCOPY  ? 2009  . CORONARY ANGIOPLASTY WITH STENT PLACEMENT  07/28/2005   Successful PTCA and stenting of the mid and proximal left anterior descending artery.  The patient is stable leaving the catheterization laboratory -- Vesta Mixer, M.D.    . CORONARY STENT PLACEMENT  02/11/2015   3.0 x 16 mm Promus Premier DES to the mLAD for ISR  . LEFT HEART CATHETERIZATION WITH CORONARY ANGIOGRAM N/A 10/02/2014   Procedure: LEFT HEART CATHETERIZATION WITH CORONARY ANGIOGRAM;  Surgeon: Lennette Bihari, MD;  [facility] LAB;  Service: Cardiovascular;  Laterality: N/A;  . LEFT HEART CATHETERIZATION WITH CORONARY ANGIOGRAM N/A 02/11/2015   Procedure: LEFT HEART CATHETERIZATION WITH CORONARY ANGIOGRAM;  Surgeon: Kathleene Hazel, MD; LAD 99% ISR, CFX okay, OM1 50%, RCA 30%, D1 30% (jailed by the LAD stent), EF 45-50 %     Current Outpatient Medications  Medication Sig Dispense Refill  . atorvastatin (LIPITOR) 80 MG tablet Take 1 tablet (80 mg total) by mouth daily. Please keep upcoming appt for future refills. Thank you 90 tablet 1  . clopidogrel (PLAVIX) 75 MG tablet Take 75 mg by mouth daily.    . dorzolamide-timolol (COSOPT) 22.3-6.8 MG/ML ophthalmic solution Place 1 drop into both eyes 2 (two) times daily.     Marland Kitchen latanoprost (XALATAN) 0.005 % ophthalmic solution Place 1 drop into both eyes at bedtime.     Marland Kitchen lisinopril  (PRINIVIL,ZESTRIL) 10 MG tablet Take 10 mg by mouth daily.    . Multiple Vitamin (MULITIVITAMIN WITH MINERALS) TABS Take 1 tablet by mouth daily.    . Multiple Vitamins-Minerals (ICAPS AREDS FORMULA PO) Take by mouth daily. Patient reports taking 2 tablets daily    . nitroGLYCERIN (NITROSTAT) 0.4 MG SL tablet Place 1 tablet (0.4 mg total) under the tongue every 5 (five) minutes as needed. For chest pain. 25 tablet 6  . finasteride (PROSCAR) 5 MG tablet Take 5 mg by mouth daily.  2   No current facility-administered medications  for this visit.     Allergies:   Patient has no known allergies.    Social History:  The patient  reports that he has never smoked. He has never used smokeless tobacco. He reports that he does not drink alcohol or use drugs.   Family History:  The patient's family history includes Cancer in his unknown relative; Lung cancer in his father.    ROS:   Noted in current history, otherwise review of systems is negative.  Physical Exam: Blood pressure 110/64, pulse (!) 48, height 5\' 10"  (1.778 m), weight 202 lb (91.6 kg), SpO2 98 %.  GEN:  Well nourished, well developed in no acute distress HEENT: Normal NECK: No JVD; No carotid bruits LYMPHATICS: No lymphadenopathy CARDIAC: RR,  Soft systolic murmur  RESPIRATORY:  Clear to auscultation without rales, wheezing or rhonchi  ABDOMEN: Soft, non-tender, non-distended MUSCULOSKELETAL:  No edema; No deformity  SKIN: Warm and dry NEUROLOGIC:  Alert and oriented x 3   EKG: Sinus bradycardia with first-degree AV block.  Heart rate is 48.    Recent Labs: 10/04/2017: ALT 19; BUN 11; Creatinine, Ser 0.93; Hemoglobin 14.4; Platelets 152; Potassium 4.2; Sodium 142    Lipid Panel    Component Value Date/Time   CHOL 108 10/04/2017 1024   TRIG 54 10/04/2017 1024   HDL 38 (L) 10/04/2017 1024   CHOLHDL 2.8 10/04/2017 1024   CHOLHDL 2.8 09/11/2016 0904   VLDL 14 09/11/2016 0904   LDLCALC 59 10/04/2017 1024      Wt  Readings from Last 3 Encounters:  05/20/18 202 lb (91.6 kg)  10/04/17 201 lb 9.6 oz (91.4 kg)  03/28/17 199 lb 12.8 oz (90.6 kg)      Other studies Reviewed: Additional studies/ records that were reviewed today include: . Review of the above records demonstrates:    ASSESSMENT AND PLAN:   1. Coronary artery disease-status post PTCA and stenting of his mid LAD. We placed a 2.75 x 20 mm Taxus stent,  Now with a 3.0 x 16 mm Promus Premier DES in the mid LAD.   He denies any episodes of angina.  Continue current medications.   2. Hyperlipidemia-  Lipids have been well controlled.  Continue current medications.  Will check labs today.  3.  Mild aortic stenosis:      Current medicines are reviewed at length with the patient today.  The patient does not have concerns regarding medicines.  The following changes have been made:  no change  Labs/ tests ordered today include:  No orders of the defined types were placed in this encounter.   Disposition:   FU with me in 6 months      Signed, Kristeen MissPhilip Ambree Frances, MD  05/20/2018 9:13 AM    [facility] [address], [place], [place]  [address] Phone: [phone](336) [phone]; Fax: [phone]

## 2018-06-25 ENCOUNTER — Telehealth: Payer: Self-pay | Admitting: Cardiovascular Disease

## 2018-06-25 NOTE — Telephone Encounter (Signed)
° °  [facility] Pre-operative Risk Assessment    Request for surgical clearance:  1. What type of surgery is being performed? Colonoscopy   2. When is this surgery scheduled? 07/02/2018  3. What type of clearance is required (medical clearance vs. Pharmacy clearance to hold med vs. Both)? Both   4. Are there any medications that need to be held prior to surgery and how long? Plavix and advise how long to hold the medication.  5. Practice name and name of physician performing surgery? [facility], Endoscopy, Dr. Watt Climes   6. What is your office phone number [phone]   7.   What is your office fax number [phone]  8.   Anesthesia type (None, local, MAC, general) ? IV sedation    Anthony Carpenter 06/25/2018, 3:10 PM  _________________________________________________________________   (provider comments below)

## 2018-06-26 ENCOUNTER — Encounter: Payer: Self-pay | Admitting: Physician Assistant

## 2018-06-26 NOTE — Telephone Encounter (Signed)
   Primary Cardiologist:Philip Nahser, MD  Chart reviewed as part of pre-operative protocol coverage.  Patient with hx of CAD s/p Taxus DES to LAD in 2006 and PCI with DES to the LAD 2/2 ISR in 01/2015 in the setting of Unstable angina. Last seen by Dr. Elease HashimotoNahser in 05/2018.  L/M for patient to call so that we can assess for clinical changes since last visit.  I will route to Dr. Elease HashimotoNahser to see if he agrees with holding Clopidogrel for the procedure.  Tereso NewcomerScott Pennye Beeghly, PA-C  06/26/2018, 2:55 PM

## 2018-06-26 NOTE — Telephone Encounter (Signed)
This encounter was created in error - please disregard.

## 2018-06-26 NOTE — Telephone Encounter (Signed)
New message   [facility] Please return call.

## 2018-06-26 NOTE — Telephone Encounter (Signed)
Pt is at low risk for colonoscopy. I would prefer that he be on some antiplatelet medication. Would it be possible for him to take ASA 81 mg while holding plavix for the procedure and then start the plavix back after the procedure? OK to hold Plavix for 5 days prior to procedure.

## 2018-06-26 NOTE — Telephone Encounter (Addendum)
I spoke with patient today.  He is doing well since last seen.  He denies chest pain, shortness of breath.  He is able to proceed with the colonoscopy.  Call back staff: Please contact surgeon's office.    We would like the patient to take ASA while he is off of Plavix for his colonoscopy.    Please ask surgeon if he is ok with the patient being on ASA for the procedure. Tereso NewcomerScott Melisssa Donner, PA-C    06/26/2018 3:48 PM

## 2018-06-27 NOTE — Telephone Encounter (Signed)
Spoke with Barb from requesting office and she states that it is okay for pt to take Aspirin while of his Plavix

## 2018-06-28 NOTE — Telephone Encounter (Signed)
Spoke with pt he is aware to take aspirin while off his Plavix.

## 2018-07-18 ENCOUNTER — Telehealth: Payer: Self-pay | Admitting: Cardiovascular Disease

## 2018-07-18 NOTE — Telephone Encounter (Signed)
New Message            [facility] Pre-operative Risk Assessment    Request for surgical clearance:  1. What type of surgery is being performed? Thulium laser of the prostate  2. When is this surgery scheduled? Pending clearance  3. What type of clearance is required (medical clearance vs. Pharmacy clearance to hold med vs. Both)? Both  4. Are there any medications that need to be held prior to surgery and how long? Plavix held for 5 days prior    5. Practice name and name of physician performing surgery? [facility]  6. What is your office phone number [phone] ext [phone]    7.   What is your office fax number [phone]  8.   Anesthesia type (None, local, MAC, general) ? General   Anthony Carpenter 07/18/2018, 3:50 PM  _________________________________________________________________   (provider comments below)

## 2018-07-18 NOTE — Telephone Encounter (Signed)
Patient seen by Dr. Elease HashimotoNahser 05/20/2018 and was doing well from a cardiac standpoint.  Last PCI was 2016.  I will route this to Dr. Elease HashimotoNahser to ask him to address whether it is okay to hold the Plavix for 5 days.  Theodore Demarkhonda Anjolina Byrer, PA-C 07/18/2018 5:59 PM Beeper [phone]

## 2018-07-19 NOTE — Telephone Encounter (Signed)
Call back staff:  **The patient can hold Plavix 5 days prior to procedure.  However, he will need to take ASA 81 mg QD while he is off of the Plavix.  Please make sure he knows to take ASA 81 mg QD while he is off of Plavix.  Once he resumes Plavix, he can stop ASA.**  . Clearance letter has been faxed to the requesting surgeon. . Please contact the surgeon's office to ensure it has been received. . This phone note will be removed from the preop pool. . Please sign encounter when completed.  Tereso NewcomerScott Weaver, PA-C    07/19/2018 10:32 AM

## 2018-07-19 NOTE — Telephone Encounter (Signed)
Anthony Carpenter is at low risk for laser prostate procedure. He may hold his Plavix for 5 days prior to procedure  I would prefer that he continue ASA 81 mg during the time that he is off the Plavix

## 2018-07-19 NOTE — Telephone Encounter (Signed)
Pt has been notified of Pre Op medication recommendations per Dr. Melburn PopperNasher and Tereso NewcomerScott Weaver, [facility]. Pt agreeable to hold Plavix 5 days prior to procedure and take ASA 81 mg daily while off the Plavix. Pt agreeable to once resuming Plavix he will stop the ASA. I will fax this plan of care to the surgeon's office as well. Pt thanked me for the call. Pt verbalized understanding with verbal read back x 2 to the instructions.

## 2018-07-24 ENCOUNTER — Other Ambulatory Visit: Payer: Self-pay | Admitting: Urology

## 2018-07-29 NOTE — Patient Instructions (Signed)
JOEVANI MATCZAK  07/29/2018   Your procedure is scheduled on: 08-06-18   Report to [facility] Main  Entrance    Report to admitting at 7:30AM    Call this number if you have problems the morning of surgery [phone]     Remember: Do not eat food or drink liquids :After Midnight.     Take these medicines the morning of surgery with A SIP OF WATER: ATORVASTATIN, FINASTERIDE                                 You may not have any metal on your body including hair pins and              piercings  Do not wear jewelry, make-up, lotions, powders or perfumes, deodorant                 Men may shave face and neck.   Do not bring valuables to the hospital. [place] IS NOT             RESPONSIBLE   FOR VALUABLES.  Contacts, dentures or bridgework may not be worn into surgery.       Patients discharged the day of surgery will not be allowed to drive home.  Name and phone number of your driver:  Special Instructions: N/A              Please read over the following fact sheets you were given: _____________________________________________________________________             [facility] - Preparing for Surgery Before surgery, you can play an important role.  Because skin is not sterile, your skin needs to be as free of germs as possible.  You can reduce the number of germs on your skin by washing with CHG (chlorahexidine gluconate) soap before surgery.  CHG is an antiseptic cleaner which kills germs and bonds with the skin to continue killing germs even after washing. Please DO NOT use if you have an allergy to CHG or antibacterial soaps.  If your skin becomes reddened/irritated stop using the CHG and inform your nurse when you arrive at Short Stay. Do not shave (including legs and underarms) for at least 48 hours prior to the first CHG shower.  You may shave your face/neck. Please follow these instructions carefully:  1.  Shower with CHG Soap the night before  surgery and the  morning of Surgery.  2.  If you choose to wash your hair, wash your hair first as usual with your  normal  shampoo.  3.  After you shampoo, rinse your hair and body thoroughly to remove the  shampoo.                           4.  Use CHG as you would any other liquid soap.  You can apply chg directly  to the skin and wash                       Gently with a scrungie or clean washcloth.  5.  Apply the CHG Soap to your body ONLY FROM THE NECK DOWN.   Do not use on face/ open  Wound or open sores. Avoid contact with eyes, ears mouth and genitals (private parts).                       Wash face,  Genitals (private parts) with your normal soap.             6.  Wash thoroughly, paying special attention to the area where your surgery  will be performed.  7.  Thoroughly rinse your body with warm water from the neck down.  8.  DO NOT shower/wash with your normal soap after using and rinsing off  the CHG Soap.                9.  Pat yourself dry with a clean towel.            10.  Wear clean pajamas.            11.  Place clean sheets on your bed the night of your first shower and do not  sleep with pets. Day of Surgery : Do not apply any lotions/deodorants the morning of surgery.  Please wear clean clothes to the hospital/surgery center.  FAILURE TO FOLLOW THESE INSTRUCTIONS MAY RESULT IN THE CANCELLATION OF YOUR SURGERY PATIENT SIGNATURE_________________________________  NURSE SIGNATURE__________________________________  ________________________________________________________________________

## 2018-07-29 NOTE — Progress Notes (Signed)
Cardiac clearance Dr Elease Hashimoto , see tele note epic 07-18-18   EKG 05-20-18 Epic   ECHO 10-15-17 Epic

## 2018-07-30 ENCOUNTER — Encounter (HOSPITAL_COMMUNITY)
Admission: RE | Admit: 2018-07-30 | Discharge: 2018-07-30 | Disposition: A | Discharge status: Home / Self Care | Payer: Medicare Other | Source: Ambulatory Visit | Attending: Urology | Admitting: Urology

## 2018-07-30 ENCOUNTER — Other Ambulatory Visit: Payer: Self-pay

## 2018-07-30 ENCOUNTER — Encounter (HOSPITAL_COMMUNITY): Payer: Self-pay

## 2018-07-30 DIAGNOSIS — Z01812 Encounter for preprocedural laboratory examination: Secondary | ICD-10-CM | POA: Insufficient documentation

## 2018-07-30 DIAGNOSIS — N400 Enlarged prostate without lower urinary tract symptoms: Secondary | ICD-10-CM | POA: Insufficient documentation

## 2018-07-30 HISTORY — DX: Unspecified glaucoma: H40.9

## 2018-07-30 LAB — BASIC METABOLIC PANEL
ANION GAP: 8 (ref 5–15)
BUN: 12 mg/dL (ref 8–23)
CALCIUM: 9.3 mg/dL (ref 8.9–10.3)
CO2: 28 mmol/L (ref 22–32)
Chloride: 108 mmol/L (ref 98–111)
Creatinine, Ser: 0.92 mg/dL (ref 0.61–1.24)
GFR calc Af Amer: >60 mL/min (ref >60)
GFR calc non Af Amer: >60 mL/min (ref >60)
Glucose, Bld: 105 mg/dL — ABNORMAL HIGH (ref 70–99)
Potassium: 4.3 mmol/L (ref 3.5–5.1)
Sodium: 144 mmol/L (ref 135–145)

## 2018-07-30 LAB — CBC
HCT: 45.2 % (ref 39.0–52.0)
HEMOGLOBIN: 14.8 g/dL (ref 13.0–17.0)
MCH: 29.8 pg (ref 26.0–34.0)
MCHC: 32.7 g/dL (ref 30.0–36.0)
MCV: 90.9 fL (ref 78.0–100.0)
Platelets: 141 10*3/uL — ABNORMAL LOW (ref 150–400)
RBC: 4.97 MIL/uL (ref 4.22–5.81)
RDW: 13.9 % (ref 11.5–15.5)
WBC: 6.4 10*3/uL (ref 4.0–10.5)

## 2018-08-05 NOTE — H&P (Signed)
Office Visit Report     07/17/2018   --------------------------------------------------------------------------------   Anthony JonesJohn R. Carpenter  MRN: [id number]  PRIMARY CARE:    DOB: 1941-03-16, 77 year old Male  REFERRING:  Thao Aurther Loft(Terry) Conley RollsLe, DO  SSN:   PROVIDER:  Jerilee FieldMatthew Rabecka Brendel, M.D.    LOCATION:  [facility] [phone]   --------------------------------------------------------------------------------   CC: I have symptoms of an enlarged prostate.  HPI: Anthony Carpenter is a 77 year-old male established patient who is here for symptoms of enlarged prostate.  He first noticed the symptoms approximately 12/21/2017. His symptoms have gotten worse over the last year. He has been treated with Proscar. The patient has never had a surgical procedure for bladder outlet obstruction to his prostate.   Patient has significant lower urinary tract symptoms. His AUA symptom score is 27 with urgency, weak stream and intermittent flow. He also strains to void. He's been on finasteride since about 02/2018. PSA prior to finasteride was 1.9 to April 2019. His PVR was 261.   He did not want to start alpha blockers because he has glaucoma and read that alpha blockers can affect the eyes.   He returns for cystoscopy. PVR 248 ml. He c/o weak stream , inc emptying.     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   Tobacco Use Assessment Completed: Used Tobacco in last 30 days? Does not use smokeless tobacco. Has never drank.  Drinks 4+ caffeinated drinks per day. 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    REVIEW OF SYSTEMS:    GU Review Male:   Patient denies frequent urination, hard to postpone urination, burning/ pain with urination, get up at night to urinate, leakage of urine, stream starts and stops, trouble starting your stream, have to strain to urinate , erection problems, and penile pain.  Gastrointestinal (Upper):   Patient denies nausea, vomiting, and indigestion/ heartburn.  Gastrointestinal (Lower):   Patient denies diarrhea and constipation.  Constitutional:   Patient denies fever, night sweats, weight loss, and fatigue.  Skin:   Patient denies skin rash/ lesion and itching.  Eyes:   Patient denies blurred vision and double vision.  Ears/ Nose/ Throat:   Patient denies sore throat and sinus problems.  Hematologic/Lymphatic:   Patient denies swollen glands and easy bruising.  Cardiovascular:   Patient denies leg swelling and chest pains.  Respiratory:   Patient denies cough and shortness of breath.  Endocrine:   Patient denies excessive thirst.  Musculoskeletal:   Patient denies back pain and joint pain.  Neurological:   Patient denies headaches and dizziness.  Psychologic:   Patient denies depression and anxiety.   VITAL SIGNS:      07/17/2018 08:09 AM  BP 128/62 mmHg  Pulse 49 /min   GU PHYSICAL  EXAMINATION:    Urethral Meatus: Normal size. No lesion, no wart, no discharge, no polyp. Normal location.  Penis: Circumcised, no warts, no cracks. No dorsal Peyronie's plaques, no left corporal Peyronie's plaques, no right corporal Peyronie's plaques, no scarring, no warts. No balanitis, no meatal stenosis.   MULTI-SYSTEM PHYSICAL  EXAMINATION:    Constitutional: Well-nourished. No physical deformities. Normally developed. Good grooming.  Neck: Neck symmetrical, not swollen. Normal tracheal position.  Respiratory: No labored breathing, no use of accessory muscles.   Cardiovascular: Normal temperature, normal extremity pulses, no swelling, no varicosities.  Skin: No paleness, no jaundice, no cyanosis. No lesion, no ulcer, no rash.  Neurologic / Psychiatric: Oriented to time, oriented to place, oriented to person. No depression, no anxiety, no agitation.  Gastrointestinal: No mass, no tenderness, no rigidity, non obese abdomen.     PAST DATA REVIEWED:  Source Of History:  Patient   PROCEDURES:         Flexible Cystoscopy - 52000  Risks, benefits, and some of the potential complications of the procedure were discussed with the patient. All questions were answered. Informed consent was obtained. Antibiotic prophylaxis was given -- Cephalexin. Sterile technique and intraurethral analgesia were used.  Meatus:  Normal size. Normal location. Normal condition.  Urethra:  No strictures.  External Sphincter:  Normal.  Verumontanum:  Normal.  Prostate:  Obstructing. Enlarged median lobe. No hyperplasia.  Bladder Neck:  Non-obstructing.  Ureteral Orifices:  Normal location. Normal size. Normal shape. Effluxed clear urine.  Bladder:  Moderate trabeculation. No tumors. Normal mucosa. No stones.      The lower urinary tract was carefully examined. The procedure was well-tolerated and without complications. Antibiotic instructions were given. Instructions were given to call the office immediately for bloody urine, difficulty urinating, painful urination, fever, chills, nausea, vomiting or other illness. The patient stated that he understood these instructions and would comply with them.        PVR Ultrasound - 16109  Scanned Volume: 248 cc         Urinalysis Dipstick Dipstick Cont'd  Color: Yellow Bilirubin: Neg mg/dL   Appearance: Clear Ketones: Neg mg/dL  Specific Gravity: 6.045 Blood: Neg ery/uL  pH: 5.5 Protein: Neg mg/dL  Glucose: Neg mg/dL Urobilinogen: 0.2 mg/dL    Nitrites: Neg    Leukocyte Esterase: Neg leu/uL    ASSESSMENT:      ICD-10 Details  1 GU:   BPH w/LUTS - N40.1   2   Weak Urinary Stream - R39.12    PLAN:           Document Letter(s):  Created for Patient: Clinical Summary         Notes:   bph, weak stream - I discussed with the patient the nature r/b/a to laser vaporization of prostate including side effects of the procedure, expected post-op course and likelihood of success. We discussed flow symptoms and irritative symptoms typically improve, but frequency and urgency can persist and rarely worsen. We also discussed risk of bleeding, infection, stricture, sexual dysfunction and incontinence among others. We discussed alternatives such as medication, TURP and Urolift. All questions answered. He elects to proceed. We will get clearance from Dr. Melburn Popper.   cc: Dr. Conley Rolls     * Signed by Jerilee Field, M.D. on 07/17/18 at 4:39 PM (EDT)*     The information contained in this medical record document is considered private and confidential patient information. This information can only be used for the medical diagnosis and/or medical services  that are being provided by the patient's selected caregivers. This information can only be distributed outside of the patient's care if the patient agrees and signs waivers of authorization for this information to be sent to an outside source or route.

## 2018-08-06 ENCOUNTER — Ambulatory Visit (HOSPITAL_COMMUNITY)
Admission: RE | Admit: 2018-08-06 | Discharge: 2018-08-06 | Disposition: A | Discharge status: Home / Self Care | Payer: Medicare Other | Source: Ambulatory Visit | Attending: Urology | Admitting: Urology

## 2018-08-06 ENCOUNTER — Ambulatory Visit (HOSPITAL_COMMUNITY): Payer: Medicare Other | Admitting: Anesthesiology

## 2018-08-06 ENCOUNTER — Encounter (HOSPITAL_COMMUNITY): Payer: Self-pay

## 2018-08-06 ENCOUNTER — Other Ambulatory Visit: Payer: Self-pay

## 2018-08-06 ENCOUNTER — Encounter (HOSPITAL_COMMUNITY)
Admission: RE | Disposition: A | Discharge status: Home / Self Care | Payer: Self-pay | Source: Ambulatory Visit | Attending: Urology

## 2018-08-06 DIAGNOSIS — H409 Unspecified glaucoma: Secondary | ICD-10-CM | POA: Insufficient documentation

## 2018-08-06 DIAGNOSIS — I10 Essential (primary) hypertension: Secondary | ICD-10-CM | POA: Insufficient documentation

## 2018-08-06 DIAGNOSIS — E7800 Pure hypercholesterolemia, unspecified: Secondary | ICD-10-CM | POA: Diagnosis not present

## 2018-08-06 DIAGNOSIS — R3912 Poor urinary stream: Secondary | ICD-10-CM | POA: Diagnosis not present

## 2018-08-06 DIAGNOSIS — Z7902 Long term (current) use of antithrombotics/antiplatelets: Secondary | ICD-10-CM | POA: Diagnosis not present

## 2018-08-06 DIAGNOSIS — I2510 Atherosclerotic heart disease of native coronary artery without angina pectoris: Secondary | ICD-10-CM | POA: Diagnosis not present

## 2018-08-06 DIAGNOSIS — N138 Other obstructive and reflux uropathy: Secondary | ICD-10-CM

## 2018-08-06 DIAGNOSIS — I252 Old myocardial infarction: Secondary | ICD-10-CM | POA: Insufficient documentation

## 2018-08-06 DIAGNOSIS — N401 Enlarged prostate with lower urinary tract symptoms: Secondary | ICD-10-CM | POA: Diagnosis not present

## 2018-08-06 DIAGNOSIS — Z79899 Other long term (current) drug therapy: Secondary | ICD-10-CM | POA: Diagnosis not present

## 2018-08-06 HISTORY — PX: THULIUM LASER TURP (TRANSURETHRAL RESECTION OF PROSTATE): SHX6744

## 2018-08-06 LAB — PROTIME-INR
INR: 1.09
PROTHROMBIN TIME: 14 s (ref 11.4–15.2)

## 2018-08-06 SURGERY — THULIUM LASER TURP (TRANSURETHRAL RESECTION OF PROSTATE)
Anesthesia: General

## 2018-08-06 MED ORDER — DEXAMETHASONE SODIUM PHOSPHATE 10 MG/ML IJ SOLN
INTRAMUSCULAR | Status: DC | PRN
  Administered 2018-08-06: 10 mg via INTRAVENOUS

## 2018-08-06 MED ORDER — HYDROMORPHONE HCL 1 MG/ML IJ SOLN: 0.2500 mg | INTRAMUSCULAR | Status: DC | PRN

## 2018-08-06 MED ORDER — LACTATED RINGERS IV SOLN
INTRAVENOUS | Status: DC
  Administered 2018-08-06: 09:00:00 via INTRAVENOUS

## 2018-08-06 MED ORDER — FENTANYL CITRATE (PF) 100 MCG/2ML IJ SOLN
INTRAMUSCULAR | Status: DC | PRN
  Administered 2018-08-06: 25 ug via INTRAVENOUS
  Administered 2018-08-06: 50 ug via INTRAVENOUS
  Administered 2018-08-06: 25 ug via INTRAVENOUS

## 2018-08-06 MED ORDER — LIDOCAINE 2% (20 MG/ML) 5 ML SYRINGE
INTRAMUSCULAR | Status: AC
  Filled 2018-08-06: qty 5

## 2018-08-06 MED ORDER — GLYCOPYRROLATE PF 0.2 MG/ML IJ SOSY
PREFILLED_SYRINGE | INTRAMUSCULAR | Status: DC | PRN
  Administered 2018-08-06: .2 mg via INTRAVENOUS

## 2018-08-06 MED ORDER — MEPERIDINE HCL 50 MG/ML IJ SOLN: 6.2500 mg | INTRAMUSCULAR | Status: DC | PRN

## 2018-08-06 MED ORDER — SODIUM CHLORIDE 0.9 % IR SOLN
Status: DC | PRN
  Administered 2018-08-06: 15000 mL via INTRAVESICAL

## 2018-08-06 MED ORDER — ONDANSETRON HCL 4 MG/2ML IJ SOLN: 4.0000 mg | Freq: Once | INTRAMUSCULAR | Status: DC | PRN

## 2018-08-06 MED ORDER — BELLADONNA ALKALOIDS-OPIUM 16.2-60 MG RE SUPP
RECTAL | Status: DC | PRN
  Administered 2018-08-06: 1 via RECTAL

## 2018-08-06 MED ORDER — BELLADONNA ALKALOIDS-OPIUM 16.2-30 MG RE SUPP
RECTAL | Status: AC
  Filled 2018-08-06: qty 1

## 2018-08-06 MED ORDER — CEPHALEXIN 500 MG PO CAPS: 500.0000 mg | ORAL_CAPSULE | Freq: Every day | ORAL | 0 refills | Status: AC

## 2018-08-06 MED ORDER — DEXAMETHASONE SODIUM PHOSPHATE 10 MG/ML IJ SOLN
INTRAMUSCULAR | Status: AC
  Filled 2018-08-06: qty 1

## 2018-08-06 MED ORDER — PROPOFOL 10 MG/ML IV BOLUS
INTRAVENOUS | Status: DC | PRN
  Administered 2018-08-06: 10 mg via INTRAVENOUS

## 2018-08-06 MED ORDER — EPHEDRINE SULFATE-NACL 50-0.9 MG/10ML-% IV SOSY
PREFILLED_SYRINGE | INTRAVENOUS | Status: DC | PRN
  Administered 2018-08-06: 10 mg via INTRAVENOUS

## 2018-08-06 MED ORDER — LIDOCAINE 2% (20 MG/ML) 5 ML SYRINGE
INTRAMUSCULAR | Status: DC | PRN
  Administered 2018-08-06: 100 mg via INTRAVENOUS

## 2018-08-06 MED ORDER — ONDANSETRON HCL 4 MG/2ML IJ SOLN
INTRAMUSCULAR | Status: DC | PRN
  Administered 2018-08-06: 4 mg via INTRAVENOUS

## 2018-08-06 MED ORDER — EPHEDRINE 5 MG/ML INJ
INTRAVENOUS | Status: AC
  Filled 2018-08-06: qty 10

## 2018-08-06 MED ORDER — CEFAZOLIN SODIUM-DEXTROSE 2-4 GM/100ML-% IV SOLN
2.0000 g | Freq: Once | INTRAVENOUS | Status: AC
  Administered 2018-08-06: 2 g via INTRAVENOUS
  Filled 2018-08-06: qty 100

## 2018-08-06 MED ORDER — FENTANYL CITRATE (PF) 100 MCG/2ML IJ SOLN
INTRAMUSCULAR | Status: AC
  Filled 2018-08-06: qty 2

## 2018-08-06 MED ORDER — ONDANSETRON HCL 4 MG/2ML IJ SOLN
INTRAMUSCULAR | Status: AC
  Filled 2018-08-06: qty 2

## 2018-08-06 MED ORDER — CLOPIDOGREL BISULFATE 75 MG PO TABS: 75.0000 mg | ORAL_TABLET | Freq: Every day | ORAL | Status: DC

## 2018-08-06 SURGICAL SUPPLY — 13 items
BAG URINE DRAINAGE (UROLOGICAL SUPPLIES) ×3 IMPLANT
BAG URO CATCHER STRL LF (MISCELLANEOUS) ×3 IMPLANT
CATH TIEMANN FOLEY 18FR 5CC (CATHETERS) ×2 IMPLANT
GLOVE BIOGEL M STRL SZ7.5 (GLOVE) ×3 IMPLANT
GOWN STRL REUS W/TWL XL LVL3 (GOWN DISPOSABLE) ×3 IMPLANT
HOLDER FOLEY CATH W/STRAP (MISCELLANEOUS) IMPLANT
LASER REVOLIX PROCEDURE (MISCELLANEOUS) ×2 IMPLANT
MANIFOLD NEPTUNE II (INSTRUMENTS) ×3 IMPLANT
PACK CYSTO (CUSTOM PROCEDURE TRAY) ×3 IMPLANT
SYR 30ML LL (SYRINGE) IMPLANT
SYRINGE IRR TOOMEY STRL 70CC (SYRINGE) IMPLANT
TUBING CONNECTING 10 (TUBING) ×2 IMPLANT
TUBING CONNECTING 10' (TUBING) ×1

## 2018-08-06 NOTE — Anesthesia Preprocedure Evaluation (Signed)
Anesthesia Evaluation  Patient identified by MRN, date of birth, ID band Patient awake    Reviewed: Allergy & Precautions, NPO status , Patient's Chart, lab work & pertinent test results  Airway Mallampati: I  TM Distance: >3 FB Neck ROM: Full    Dental   Pulmonary    Pulmonary exam normal        Cardiovascular hypertension, Pt. on medications + CAD, + Past MI and + Cardiac Stents  Normal cardiovascular exam     Neuro/Psych    GI/Hepatic   Endo/Other    Renal/GU      Musculoskeletal   Abdominal   Peds  Hematology   Anesthesia Other Findings   Reproductive/Obstetrics                             Anesthesia Physical Anesthesia Plan  ASA: III  Anesthesia Plan: General   Post-op Pain Management:    Induction: Intravenous  PONV Risk Score and Plan: 2 and Ondansetron and Treatment may vary due to age or medical condition  Airway Management Planned: LMA  Additional Equipment:   Intra-op Plan:   Post-operative Plan: Extubation in OR  Informed Consent: I have reviewed the patients History and Physical, chart, labs and discussed the procedure including the risks, benefits and alternatives for the proposed anesthesia with the patient or authorized representative who has indicated his/her understanding and acceptance.     Plan Discussed with: CRNA and Surgeon  Anesthesia Plan Comments:         Anesthesia Quick Evaluation

## 2018-08-06 NOTE — Discharge Instructions (Signed)
Indwelling Urinary Catheter Care, Adult °Take good care of your catheter to keep it working and to prevent problems. °How to wear your catheter °Attach your catheter to your leg with tape (adhesive tape) or a leg strap. Make sure it is not too tight. If you use tape, remove any bits of tape that are already on the catheter. °How to wear a drainage bag °You should have: °· A large overnight bag. °· A small leg bag. ° °Overnight Bag °You may wear the overnight bag at any time. Always keep the bag below the level of your bladder but off the floor. When you sleep, put a clean plastic bag in a wastebasket. Then hang the bag inside the wastebasket. °Leg Bag °Never wear the leg bag at night. Always wear the leg bag below your knee. Keep the leg bag secure with a leg strap or tape. °How to care for your skin °· Clean the skin around the catheter at least once every day. °· Shower every day. Do not take baths. °· Put creams, lotions, or ointments on your genital area only as told by your doctor. °· Do not use powders, sprays, or lotions on your genital area. °How to clean your catheter and your skin °1. Wash your hands with soap and water. °2. Wet a washcloth in warm water and gentle (mild) soap. °3. Use the washcloth to clean the skin where the catheter enters your body. Clean downward and wipe away from the catheter in small circles. Do not wipe toward the catheter. °4. Pat the area dry with a clean towel. Make sure to clean off all soap. °How to care for your drainage bags °Empty your drainage bag when it is ?-½ full or at least 2-3 times a day. Replace your drainage bag once a month or sooner if it starts to smell bad or look dirty. Do not clean your drainage bag unless told by your doctor. °Emptying a drainage bag ° °Supplies Needed °· Rubbing alcohol. °· Gauze pad or cotton ball. °· Tape or a leg strap. ° °Steps °1. Wash your hands with soap and water. °2. Separate (detach) the bag from your leg. °3. Hold the bag over  the toilet or a clean container. Keep the bag below your hips and bladder. This stops pee (urine) from going back into the tube. °4. Open the pour spout at the bottom of the bag. °5. Empty the pee into the toilet or container. Do not let the pour spout touch any surface. °6. Put rubbing alcohol on a gauze pad or cotton ball. °7. Use the gauze pad or cotton ball to clean the pour spout. °8. Close the pour spout. °9. Attach the bag to your leg with tape or a leg strap. °10. Wash your hands. ° °Changing a drainage bag °Supplies Needed °· Alcohol wipes. °· A clean drainage bag. °· Adhesive tape or a leg strap. ° °Steps °1. Wash your hands with soap and water. °2. Separate the dirty bag from your leg. °3. Pinch the rubber catheter with your fingers so that pee does not spill out. °4. Separate the catheter tube from the drainage tube where these tubes connect (at the connection valve). Do not let the tubes touch any surface. °5. Clean the end of the catheter tube with an alcohol wipe. Use a different alcohol wipe to clean the end of the drainage tube. °6. Connect the catheter tube to the drainage tube of the clean bag. °7. Attach the new bag to   the leg with adhesive tape or a leg strap. °8. Wash your hands. ° °How to prevent infection and other problems °· Never pull on your catheter or try to remove it. Pulling can damage tissue in your body. °· Always wash your hands before and after touching your catheter. °· If a leg strap gets wet, replace it with a dry one. °· Drink enough fluids to keep your pee clear or pale yellow, or as told by your doctor. °· Do not let the drainage bag or tubing touch the floor. °· Wear cotton underwear. °· If you are male, wipe from front to back after you poop (have a bowel movement). °· Check on the catheter often to make sure it works and the tubing is not twisted. °Get help if: °· Your pee is cloudy. °· Your pee smells unusually bad. °· Your pee is not draining into the bag. °· Your  tube gets clogged. °· Your catheter starts to leak. °· Your bladder feels full. °Get help right away if: °· You have redness, swelling, or pain where the catheter enters your body. °· You have fluid, pus, or a bad smell coming from the area where the catheter enters your body. °· The area where the catheter enters your body feels warm. °· You have a fever. °· You have pain in your: °? Stomach (abdomen). °? Legs. °? Lower back. °? Bladder. °· You see blood fill the catheter. °· Your pee is pink or red. °· You feel sick to your stomach (nauseous). °· You throw up (vomit). °· You have chills. °· Your catheter gets pulled out. °This information is not intended to replace advice given to you by your health care provider. Make sure you discuss any questions you have with your health care provider. °Document Released: 03/03/2013 Document Revised: 10/04/2016 Document Reviewed: 04/21/2014 °Elsevier Interactive Patient Education © 2018 [employer] ° ° °Prostate Laser Surgery, Care After °This sheet gives you information about how to care for yourself after your procedure. Your health care provider may also give you more specific instructions. If you have problems or questions, contact your health care provider. °What can I expect after the procedure? °For the first few weeks after the procedure: °· You will feel a need to urinate often. °· You may have blood in your urine. °· You may feel a sudden need to urinate. ° °Once your urinary catheter is removed, you may have a burning feeling when you urinate, especially at the end of urination. This feeling usually passes within 3-5 days. °Follow these instructions at home: °Activity °· Return to your normal activities as told by your health care provider. Ask your health care provider what activities are safe for you. °· Do not do vigorous exercise for 1 week or as told by your health care provider. °· Do not lift anything that is heavier than 10 lb (4.5 kg) until your health  care provider say it is safe. °· Avoid sexual activity for 4-6 weeks or as told by your health care provider. °· Do not ride in a car for extended periods of time for 1 month or as told by your health care provider. °· Do not drive for 24 hours if you were given a medicine to help you relax (sedative). °Diet °· Eat foods that are high in fiber, such as fresh fruits and vegetables, whole grains, and beans. °· Drink enough fluid to keep your urine clear or pale yellow. °Medicines °· Take over-the-counter and prescription medicines,   including stool softeners, only as told by your health care provider. °· If you were prescribed an antibiotic medicine, take it as told by your health care provider. Do not stop taking the antibiotic even if you start to feel better. °General instructions °· If you were given elastic support stockings, wear them as told by your health care provider. °· Do not strain to have a bowel movement. Straining may lead to bleeding from the prostate and cause clots to form and cause trouble urinating. °· Keep all follow-up visits as told by your health care provider. This is important. °Contact a health care provider if: °· You have a fever or chills. °· You have spasms or pain with the urinary catheter still in place. °· Once the catheter has been removed, you experience difficulty starting your stream when attempting to urinate. °Get help right away if: °· There is a blockage in your catheter. °· Your catheter has been removed and you are suddenly unable to urinate. °· Your urine smells unusually bad. °· You start to have blood clots in your urine. °· The blood in your urine becomes persistent or gets thick. °· You develop chest pains. °· You develop shortness of breath. °· You develop swelling or pain in your leg. °Summary °· You may notice urinary symptoms for a few weeks after your procedure. °· Follow instructions from your health care provider regarding activity restrictions such as lifting,  exercise, and sexual activity. °· Contact your health care provider if you have any unusual symptoms during your recovery. °This information is not intended to replace advice given to you by your health care provider. Make sure you discuss any questions you have with your health care provider. °Document Released: 11/06/2005 Document Revised: 06/23/2016 Document Reviewed: 06/23/2016 °Elsevier Interactive Patient Education © 2018 [employer]. ° °

## 2018-08-06 NOTE — Anesthesia Procedure Notes (Signed)
Procedure Name: LMA Insertion Date/Time: 08/06/2018 9:50 AM Performed by: Doran ClayAlday, Lenell Lama R, CRNA Pre-anesthesia Checklist: Patient identified, Emergency Drugs available, Suction available, Patient being monitored and Timeout performed Patient Re-evaluated:Patient Re-evaluated prior to induction Oxygen Delivery Method: Circle system utilized Preoxygenation: Pre-oxygenation with 100% oxygen Induction Type: IV induction LMA: LMA inserted LMA Size: 5.0 Number of attempts: 1 Placement Confirmation: positive ETCO2 and breath sounds checked- equal and bilateral Tube secured with: Tape Dental Injury: Teeth and Oropharynx as per pre-operative assessment

## 2018-08-06 NOTE — Op Note (Signed)
Preoperative diagnosis: BPH, lower urinary tract symptoms, weak stream Postoperative diagnosis: Same  Procedure: Thulium laser vaporization of the prostate  Surgeon: Mena GoesEskridge  Anesthesia: General  Indication for procedure: 77 year old with symptomatic BPH who failed medical therapy and elected to proceed with holmium laser vaporization.  Findings: On cystoscopy there were a tall floppy lateral lobes obstructing as well as a tall obstructing median lobe.  Bladder was moderately trabeculated without stone or foreign body.  The trigone and ureteral orifices were identified and noted throughout the case.  Clear eflux noted.  On exam under anesthesia the prostate was smooth without hard area or nodules, 50 g.  I placed a B&O suppository.  Description of procedure: After consent was obtained patient brought to the operating room.  After adequate anesthesia he was placed in lithotomy position and prepped and draped in the usual fashion.  A timeout was performed to confirm the patient and procedure.  The cystoscope was passed per urethra and I carefully inspected the urethra, prostatic urethra and the bladder.  Identified the ureteral orifice on the left and right side.  I passed the laser fiber utilizing the laser continuous flow sheath.  The left and the right ureteral orifice were marked for easy identification and then I made an incision at 7:00 in line with the right ureteral orifice and brought this down to the verumontanum.  Similar incision was made on the left side to the bladder neck and down to the verumontanum.  I then vaporized the median lobe.  I then identified the lateral lobes and started from anterior to posterior bladder neck and work my way down toward the verumontanum on the left.  And then on the right.  Apical tissue remained and this was directly targeted lateral to the verumontanum on the right side and the left side and brought back proximally in line with the other incisions.  This  created an excellent channel with excellent hemostasis at low pressure.  There remains some obstructing lateral lobe tissue and this was vaporized.  The bladder was drained and on another inspection there was a flap of median lobe and some overhanging proximal lateral lobes all of which were vaporized.  The left and the and right ureteral orifice were inspected and noted to be normal.  Vaporization was not taken down distal to the verumontanum and I left some apical tissue.  However this created an excellent channel with good hemostasis.  The bladder and ureteral orifice on the left and the right side were inspected one more time and noted to be normal.  The bladder was filled with fluid and the scope removed.  An 1718 French coud catheter was placed in left to gravity drainage for 20 cc in the balloon.  Drainage and irrigation was clear.  A digital rectal exam was performed and I placed a B&O suppository.  He was awakened and taken to recovery room in stable condition.  Complications: None  Blood loss: Minimal  Specimens: None  Drains: 4918 French coud catheter  Disposition: Patient stable to PACU

## 2018-08-06 NOTE — Transfer of Care (Signed)
Immediate Anesthesia Transfer of Care Note  Patient: Anthony JonesJohn R Carpenter  Procedure(s) Performed: Morton PetersHULIUM LASER TURP (TRANSURETHRAL RESECTION OF PROSTATE) (N/A )  Patient Location: PACU  Anesthesia Type:General  Level of Consciousness: sedated  Airway & Oxygen Therapy: Patient Spontanous Breathing and Patient connected to face mask oxygen  Post-op Assessment: Report given to RN and Post -op Vital signs reviewed and stable  Post vital signs: Reviewed and stable  Last Vitals:  Vitals Value Taken Time  BP 115/55 08/06/2018 11:28 AM  Temp    Pulse 58 08/06/2018 11:29 AM  Resp 11 08/06/2018 11:29 AM  SpO2 100 % 08/06/2018 11:29 AM  Vitals shown include unvalidated device data.  Last Pain:  Vitals:   08/06/18 0758  TempSrc: Oral         Complications: No apparent anesthesia complications

## 2018-08-06 NOTE — Anesthesia Postprocedure Evaluation (Signed)
Anesthesia Post Note  Patient: Eber JonesJohn R Guirguis  Procedure(s) Performed: THULIUM LASER TURP (TRANSURETHRAL RESECTION OF PROSTATE) (N/A )     Patient location during evaluation: PACU Anesthesia Type: General Level of consciousness: awake and alert Pain management: pain level controlled Vital Signs Assessment: post-procedure vital signs reviewed and stable Respiratory status: spontaneous breathing, nonlabored ventilation, respiratory function stable and patient connected to nasal cannula oxygen Cardiovascular status: blood pressure returned to baseline and stable Postop Assessment: no apparent nausea or vomiting Anesthetic complications: no    Last Vitals:  Vitals:   08/06/18 1142 08/06/18 1145  BP:  (!) 117/55  Pulse: 64 (!) 58  Resp: 18 15  Temp:    SpO2: 100% 100%    Last Pain:  Vitals:   08/06/18 1145  TempSrc:   PainSc: 0-No pain                 Teralyn Mullins DAVID

## 2018-08-06 NOTE — Interval H&P Note (Signed)
History and Physical Interval Note:  08/06/2018 8:58 AM  Anthony Carpenter  has presented today for surgery, with the diagnosis of BENIGN PROSTATIC HYPERPLASIA, WEAK URINARY STREAM  The various methods of treatment have been discussed with the patient and family. After consideration of risks, benefits and other options for treatment, the patient has consented to  Procedure(s): THULIUM LASER TURP (TRANSURETHRAL RESECTION OF PROSTATE) (N/A) as a surgical intervention .  The patient's history has been reviewed, patient examined, no change in status, stable for surgery.  He is well today without complaint.  He said no dysuria or gross hematuria.  No fever.  Discussed again the nature risk benefits and alternatives to laser vaporization of the prostate and discussed expectations for obstructive and irritative symptoms-typically flow symptoms improve as well as frequency and urgency but sometimes the frequency and urgency can remain the same or worsen.  Discussed risk of incontinence, bleeding and stricture among others. I have reviewed the patient's chart and labs.  Questions were answered to the patient's and his wife's satisfaction.     Jerilee FieldMatthew Shatisha Falter

## 2018-08-07 ENCOUNTER — Encounter (HOSPITAL_COMMUNITY): Payer: Self-pay | Admitting: Urology

## 2018-09-19 DIAGNOSIS — M65342 Trigger finger, left ring finger: Secondary | ICD-10-CM | POA: Insufficient documentation

## 2018-09-25 ENCOUNTER — Other Ambulatory Visit: Payer: Self-pay | Admitting: Cardiovascular Disease

## 2018-09-25 DIAGNOSIS — E785 Hyperlipidemia, unspecified: Secondary | ICD-10-CM

## 2018-11-25 ENCOUNTER — Encounter: Payer: Self-pay | Admitting: Cardiovascular Disease

## 2018-11-25 ENCOUNTER — Ambulatory Visit: Payer: Medicare Other | Admitting: Cardiovascular Disease

## 2018-11-25 VITALS — BP 124/64 | HR 51 | Ht 70.0 in | Wt 199.0 lb

## 2018-11-25 DIAGNOSIS — R238 Other skin changes: Secondary | ICD-10-CM | POA: Diagnosis not present

## 2018-11-25 DIAGNOSIS — I2510 Atherosclerotic heart disease of native coronary artery without angina pectoris: Secondary | ICD-10-CM

## 2018-11-25 DIAGNOSIS — E782 Mixed hyperlipidemia: Secondary | ICD-10-CM

## 2018-11-25 DIAGNOSIS — R233 Spontaneous ecchymoses: Secondary | ICD-10-CM

## 2018-11-25 NOTE — Patient Instructions (Signed)

## 2018-11-25 NOTE — Progress Notes (Signed)
Cardiology Office Note   Date:  11/25/2018   ID:  Eber JonesJohn R Ange, DOB 10/15/1941, MRN [id number]  PCP:  Joycelyn RuaMeyers, Stephen, MD  Cardiologist:   Kristeen MissPhilip Rorie Delmore, MD   Chief Complaint  Patient presents with  . Coronary Artery Disease    1. Coronary artery disease-status post PTCA and stenting of his mid LAD.  ( February 11, 2015)    We placed a 2.75 x 20 mm Taxus stent. It was post dilated using a 3.0 Quantum Maverick, 07/28/05  2. Hyperlipidemia-   3. Possible bicuspid aortic valve   Raiford NobleRick is doing well. No chest pain or dyspnea. He plans on putting in a garden this summer . He's building his house this year. He was admitted to the hospital with some chest pain/abdominal pain. A Myoview study did not reveal any ischemia.  He has felt better. No further episodes of chest pain. He forgot to take his meds this am.  Feb. 26, 2014: Raiford NobleRick is doing well. He has not had any angina. He remains active - still building houses.   July 14, 2013:  Raiford NobleRick is doing Well. No angina.   July 14, 2014:  Raiford NobleRick is doing well. Still working No CP, no dyspnea.   Dec. 11, 2015;  Raiford NobleRick was in the hospital for CP recently. He was having vague chest discomfort. Cath showed a 60 % instent restenosis. Pains have resolved. He is back doing all of his normal activities. BP has been lower   February 05, 2015:  Eber JonesJohn R Reaser is a 78 y.o. male who presents for some CP and DOE for the past month. 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 Will last as long as he's working . Eases up if he stops.   March 17, 2015:  Eber JonesJohn R Rayson is a [age] y.o. male who presents for follow up of an episode of unstable angina.  I saw him in March for symptoms worisome of UAP.  CAth showed.   Left main: No obstructive disease.   Left Anterior Descending Artery: Large caliber vessel that courses to the apex. The mid segment is stented. The old stent has 99% restenosis. The distal  vessel is free of obstructive disease. The Diagonal branch is moderate in caliber with ostial 30% stenosis where the vessel is jailed by the LAD stent.  Circumflex Artery: Large caliber vessel with moderate caliber obtuse marginal branch. The obtuse marginal branch has proximal 50% stenosis.  Right Coronary Artery: Large dominant vessel with 30% mid stenosis.  Left Ventricular Angiogram: LVEF=45-50% with hypokinesis of the anterior wall and apex.  He had 3.0 x 16 mm Promus Premier DES in the mid LAD. The stent was post-dilated with a 3.25 x 12 mm [place] balloon x 1. The stenosis was taken from 99% down to 0%.   He is feeling much better.   Not shortness of breath   Participating in cardiac rehab. Bruising from the plavix.   But no real PC   Oct. 24, 2016:  Raiford NobleRick is doing well. Walking regularly  Has several projects doing on. Developing some land  Has some bruising and occasional nose bleeds.   March 13, 2016:  Staying active.    Occasional nose bleeds   Oct. 23, 2017:  Doing well .  No CP or dyspnea. Cooked 80 gallons of [place] stew this weekend  Has occasional nose bleed   Mar 28, 2017:  Doing well.   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   Has lots of bruising but not so much nosebleeding. We have stopped the ASA and continued the plavix.    May 20, 2018: Raiford NobleRick is seen today for follow-up of his coronary artery disease and hyperlipidemia No CP ,  No dyspnea.   Still bruising.   Is still on Plavix,   Is off ASA  No recent nose bleeds  Jan. 6, 2020 Has mild DOE if he exrcises vigorously No return of the angina he had last year  Past Medical History:  Diagnosis Date  . Aortic stenosis, mild   . Bicuspid aortic valve   . Bradycardia   . Carotid bruit present    a. Right side  . Coronary artery disease    a. CAD s/p PTCA/Taxus stent to mid-prox LAD 2006  b. cath 10/02/14 with 60% focal instent restenosis in LAD prox to  the Diag takeoff, 30% mid AV groove left circ stenosis and luminal irregularities in a large dominant RCA. FFR of LAD was 0.85 and not felt to be HD significant to warrant PCI.  Marland Kitchen. Glaucoma   . Hyperlipidemia   . Hypertension   . Myocardial infarction (HCC) 2006  . Pulmonary nodule seen on imaging study    a. seen on CT during 09/2014 admission, may require follow up imaging     Past Surgical History:  Procedure Laterality Date  . CARDIAC CATHETERIZATION  07/28/2005   Est EF of 55% -- Single-vessel obstructive disease in the proximal/mid left anterior descending -- Preserved left ventricular systolic function with good anterior wall motion  . cataracts  5-6 years ago  . COLONOSCOPY  ? 2009  . CORONARY ANGIOPLASTY WITH STENT PLACEMENT  07/28/2005   Successful PTCA and stenting of the mid and proximal left anterior descending artery.  The patient is stable leaving the catheterization laboratory -- Vesta MixerPhilip J. Leoma Folds, M.D.    . CORONARY STENT PLACEMENT  02/11/2015   3.0 x 16 mm Promus Premier DES to the mLAD for ISR  . LEFT HEART CATHETERIZATION WITH CORONARY ANGIOGRAM N/A 10/02/2014   Procedure: LEFT HEART CATHETERIZATION WITH CORONARY ANGIOGRAM;  Surgeon: Lennette Biharihomas A Kelly, MD;  [facility];  Service: Cardiovascular;  Laterality: N/A;  . LEFT HEART CATHETERIZATION WITH CORONARY ANGIOGRAM N/A 02/11/2015   Procedure: LEFT HEART CATHETERIZATION WITH CORONARY ANGIOGRAM;  Surgeon: Kathleene Hazelhristopher D McAlhany, MD; LAD 99% ISR, CFX okay, OM1 50%, RCA 30%, D1 30% (jailed by the LAD stent), EF 45-50 %  . THULIUM LASER TURP (TRANSURETHRAL RESECTION OF PROSTATE) N/A 08/06/2018   Procedure: THULIUM LASER TURP (TRANSURETHRAL RESECTION OF PROSTATE);  Surgeon: Jerilee FieldEskridge, Matthew, MD;  Location: [facility] ORS;  Service: Urology;  Laterality: N/A;     Current Outpatient Medications  Medication Sig Dispense Refill  . atorvastatin (LIPITOR) 80 MG tablet Take 1 tablet (80 mg total) by mouth daily at 6 PM. 90 tablet 2   . clopidogrel (PLAVIX) 75 MG tablet TAKE 1 TABLET DAILY WITH   BREAKFAST 90 tablet 2  . dorzolamide-timolol (COSOPT) 22.3-6.8 MG/ML ophthalmic solution Place 1 drop into both eyes 2 (two) times daily.     . finasteride (PROSCAR) 5 MG tablet Take 5 mg by mouth daily.  2  . latanoprost (XALATAN) 0.005 % ophthalmic solution Place 1 drop into both eyes at bedtime.     Marland Kitchen. lisinopril (PRINIVIL,ZESTRIL) 10 MG tablet TAKE 1 TABLET DAILY 90 tablet 2  . Multiple Vitamins-Minerals (ICAPS AREDS FORMULA PO) Take 1 tablet by mouth  2 (two) times daily.     . nitroGLYCERIN (NITROSTAT) 0.4 MG SL tablet Place 1 tablet (0.4 mg total) under the tongue every 5 (five) minutes as needed. For chest pain. 25 tablet 6   No current facility-administered medications for this visit.     Allergies:   Patient has no known allergies.    Social History:  The patient  reports that he has never smoked. He has never used smokeless tobacco. He reports that he does not drink alcohol or use drugs.   Family History:  The patient's family history includes Cancer in his unknown relative; Lung cancer in his father.    ROS:   Noted in current history, otherwise review of systems is negative.  Physical Exam: Blood pressure 124/64, pulse (!) 51, height 5\' 10"  (1.778 m), weight 199 lb (90.3 kg), SpO2 94 %.  GEN:  Well nourished, well developed in no acute distress HEENT: Normal NECK: No JVD; No carotid bruits LYMPHATICS: No lymphadenopathy CARDIAC: RRR, soft systolic murmur  RESPIRATORY:  Clear to auscultation without rales, wheezing or rhonchi  ABDOMEN: Soft, non-tender, non-distended MUSCULOSKELETAL:  No edema; No deformity  SKIN: Warm and dry NEUROLOGIC:  Alert and oriented x 3    EKG:      Recent Labs: 05/20/2018: ALT 17 07/30/2018: BUN 12; Creatinine, Ser 0.92; Hemoglobin 14.8; Platelets 141; Potassium 4.3; Sodium 144    Lipid Panel    Component Value Date/Time   CHOL 110 05/20/2018 0923   TRIG 62 05/20/2018  0923   HDL 41 05/20/2018 0923   CHOLHDL 2.7 05/20/2018 0923   CHOLHDL 2.8 09/11/2016 0904   VLDL 14 09/11/2016 0904   LDLCALC 57 05/20/2018 0923      Wt Readings from Last 3 Encounters:  11/25/18 199 lb (90.3 kg)  08/06/18 196 lb (88.9 kg)  07/30/18 196 lb (88.9 kg)      Other studies Reviewed: Additional studies/ records that were reviewed today include: . Review of the above records demonstrates:    ASSESSMENT AND PLAN:   1. Coronary artery disease-status post PTCA and stenting of his mid LAD. We placed a 2.75 x 20 mm Taxus stent,  Now with a 3.0 x 16 mm Promus Premier DES in the mid LAD.    no angina  Advised him to get out and exercise a bit   2. Hyperlipidemia-   Check labs at next ov    3.  Mild aortic stenosis:      Current medicines are reviewed at length with the patient today.  The patient does not have concerns regarding medicines.  The following changes have been made:  no change  Labs/ tests ordered today include:   Orders Placed This Encounter  Procedures  . Lipid Profile  . Basic Metabolic Panel (BMET)  . Hepatic function panel  . CBC    Disposition:   FU with me in 6 months      Signed, Kristeen Miss, MD  11/25/2018 6:03 PM    [facility] [address] [place], [place], [place]  [address] Phone: [phone]; Fax: [phone]

## 2018-12-04 ENCOUNTER — Emergency Department (HOSPITAL_BASED_OUTPATIENT_CLINIC_OR_DEPARTMENT_OTHER)
Admission: EM | Admit: 2018-12-04 | Discharge: 2018-12-04 | Disposition: A | Discharge status: Home / Self Care | Payer: Medicare Other | Attending: Emergency Medicine | Admitting: Emergency Medicine

## 2018-12-04 ENCOUNTER — Emergency Department (HOSPITAL_BASED_OUTPATIENT_CLINIC_OR_DEPARTMENT_OTHER): Payer: Medicare Other

## 2018-12-04 ENCOUNTER — Other Ambulatory Visit: Payer: Self-pay

## 2018-12-04 ENCOUNTER — Encounter (HOSPITAL_BASED_OUTPATIENT_CLINIC_OR_DEPARTMENT_OTHER): Payer: Self-pay | Admitting: *Deleted

## 2018-12-04 DIAGNOSIS — I10 Essential (primary) hypertension: Secondary | ICD-10-CM | POA: Diagnosis not present

## 2018-12-04 DIAGNOSIS — Y9389 Activity, other specified: Secondary | ICD-10-CM | POA: Insufficient documentation

## 2018-12-04 DIAGNOSIS — S20211A Contusion of right front wall of thorax, initial encounter: Secondary | ICD-10-CM | POA: Insufficient documentation

## 2018-12-04 DIAGNOSIS — S299XXA Unspecified injury of thorax, initial encounter: Secondary | ICD-10-CM | POA: Diagnosis present

## 2018-12-04 DIAGNOSIS — I252 Old myocardial infarction: Secondary | ICD-10-CM | POA: Diagnosis not present

## 2018-12-04 DIAGNOSIS — Z955 Presence of coronary angioplasty implant and graft: Secondary | ICD-10-CM | POA: Diagnosis not present

## 2018-12-04 DIAGNOSIS — I2510 Atherosclerotic heart disease of native coronary artery without angina pectoris: Secondary | ICD-10-CM | POA: Diagnosis not present

## 2018-12-04 DIAGNOSIS — W01198A Fall on same level from slipping, tripping and stumbling with subsequent striking against other object, initial encounter: Secondary | ICD-10-CM | POA: Diagnosis not present

## 2018-12-04 DIAGNOSIS — Y998 Other external cause status: Secondary | ICD-10-CM | POA: Diagnosis not present

## 2018-12-04 DIAGNOSIS — Y92000 Kitchen of unspecified non-institutional (private) residence as  the place of occurrence of the external cause: Secondary | ICD-10-CM | POA: Insufficient documentation

## 2018-12-04 DIAGNOSIS — W19XXXA Unspecified fall, initial encounter: Secondary | ICD-10-CM

## 2018-12-04 DIAGNOSIS — Z79899 Other long term (current) drug therapy: Secondary | ICD-10-CM | POA: Insufficient documentation

## 2018-12-04 DIAGNOSIS — Z7902 Long term (current) use of antithrombotics/antiplatelets: Secondary | ICD-10-CM | POA: Diagnosis not present

## 2018-12-04 MED ORDER — ACETAMINOPHEN 325 MG PO TABS
650.0000 mg | ORAL_TABLET | Freq: Once | ORAL | Status: AC
  Administered 2018-12-04: 650 mg via ORAL

## 2018-12-04 MED ORDER — ACETAMINOPHEN 325 MG PO TABS
ORAL_TABLET | ORAL | Status: AC
  Filled 2018-12-04: qty 2

## 2018-12-04 NOTE — ED Notes (Signed)
Pt given incentive spirometer and demonstrated proper use with teach back.

## 2018-12-04 NOTE — ED Triage Notes (Signed)
Pt fell this morning and fell against a kitchen cabinet. He is feeling pain on his right ribs and right hand.

## 2018-12-04 NOTE — ED Notes (Signed)
ED Provider at bedside. 

## 2018-12-04 NOTE — ED Provider Notes (Signed)
[facility] EMERGENCY DEPARTMENT Provider Note   CSN: [id number] Arrival date & time: 12/04/18  1358     History   Chief Complaint Chief Complaint  Patient presents with  . Fall    HPI Anthony Carpenter is a 78 y.o. male.  HPI 78 year old male who slipped in the kitchen today and fell striking the counter with his right lateral chest and presenting with right lateral rib and chest tenderness.  Worse with palpation.  Small bruise noted at the right lateral chest.  3 skin tears to his right hand and right forearm without active bleeding.  No head injury.  No neck pain.  No weakness of his arms or legs.  Ambulatory since the fall.  Denies abdominal pain.  No hip or back pain.  Symptoms are mild in severity.  No medication tried prior to arrival.   Past Medical History:  Diagnosis Date  . Aortic stenosis, mild   . Bicuspid aortic valve   . Bradycardia   . Carotid bruit present    a. Right side  . Coronary artery disease    a. CAD s/p PTCA/Taxus stent to mid-prox LAD 2006  b. cath 10/02/14 with 60% focal instent restenosis in LAD prox to the Diag takeoff, 30% mid AV groove left circ stenosis and luminal irregularities in a large dominant RCA. FFR of LAD was 0.85 and not felt to be HD significant to warrant PCI.  Marland Kitchen. Glaucoma   . Hyperlipidemia   . Hypertension   . Myocardial infarction (HCC) 2006  . Pulmonary nodule seen on imaging study    a. seen on CT during 09/2014 admission, may require follow up imaging     Patient Active Problem List   Diagnosis Date Noted  . Mixed hyperlipidemia 09/11/2016  . Unstable angina (HCC) 02/11/2015  . Aortic stenosis, mild   . Coronary artery disease   . Carotid bruit present   . Pulmonary nodule seen on imaging study   . HTN (hypertension) 10/02/2014  . Bradycardia 10/02/2014  . Unstable angina pectoris (HCC) 10/01/2014  . Aortic valve sclerosis 07/14/2013  . Dyslipidemia 07/07/2011    Past Surgical History:  Procedure  Laterality Date  . CARDIAC CATHETERIZATION  07/28/2005   Est EF of 55% -- Single-vessel obstructive disease in the proximal/mid left anterior descending -- Preserved left ventricular systolic function with good anterior wall motion  . cataracts  5-6 years ago  . COLONOSCOPY  ? 2009  . CORONARY ANGIOPLASTY WITH STENT PLACEMENT  07/28/2005   Successful PTCA and stenting of the mid and proximal left anterior descending artery.  The patient is stable leaving the catheterization laboratory -- Vesta MixerPhilip J. Nahser, M.D.    . CORONARY STENT PLACEMENT  02/11/2015   3.0 x 16 mm Promus Premier DES to the mLAD for ISR  . LEFT HEART CATHETERIZATION WITH CORONARY ANGIOGRAM N/A 10/02/2014   Procedure: LEFT HEART CATHETERIZATION WITH CORONARY ANGIOGRAM;  Surgeon: Lennette Biharihomas A Kelly, MD;  [facility];  Service: Cardiovascular;  Laterality: N/A;  . LEFT HEART CATHETERIZATION WITH CORONARY ANGIOGRAM N/A 02/11/2015   Procedure: LEFT HEART CATHETERIZATION WITH CORONARY ANGIOGRAM;  Surgeon: Kathleene Hazelhristopher D McAlhany, MD; LAD 99% ISR, CFX okay, OM1 50%, RCA 30%, D1 30% (jailed by the LAD stent), EF 45-50 %  . THULIUM LASER TURP (TRANSURETHRAL RESECTION OF PROSTATE) N/A 08/06/2018   Procedure: THULIUM LASER TURP (TRANSURETHRAL RESECTION OF PROSTATE);  Surgeon: Jerilee FieldEskridge, Matthew, MD;  Location: [facility];  Service: Urology;  Laterality: N/A;  Home Medications    Prior to Admission medications   Medication Sig Start Date End Date Taking? Authorizing Provider  atorvastatin (LIPITOR) 80 MG tablet Take 1 tablet (80 mg total) by mouth daily at 6 PM. 09/25/18   Nahser, Deloris Ping, MD  clopidogrel (PLAVIX) 75 MG tablet TAKE 1 TABLET DAILY WITH   BREAKFAST 09/25/18   Nahser, Deloris Ping, MD  dorzolamide-timolol (COSOPT) 22.3-6.8 MG/ML ophthalmic solution Place 1 drop into both eyes 2 (two) times daily.     [provider]  finasteride (PROSCAR) 5 MG tablet Take 5 mg by mouth daily. 05/12/18   [provider]    latanoprost (XALATAN) 0.005 % ophthalmic solution Place 1 drop into both eyes at bedtime.     [provider]  lisinopril (PRINIVIL,ZESTRIL) 10 MG tablet TAKE 1 TABLET DAILY 09/25/18   Nahser, Deloris Ping, MD  Multiple Vitamins-Minerals (ICAPS AREDS FORMULA PO) Take 1 tablet by mouth 2 (two) times daily.     [provider]  nitroGLYCERIN (NITROSTAT) 0.4 MG SL tablet Place 1 tablet (0.4 mg total) under the tongue every 5 (five) minutes as needed. For chest pain. 03/28/17   Nahser, Deloris Ping, MD    Family History Family History  Problem Relation Age of Onset  . Lung cancer Father   . Cancer Other   . Heart disease Neg Hx     Social History Social History   Tobacco Use  . Smoking status: Never Smoker  . Smokeless tobacco: Never Used  Substance Use Topics  . Alcohol use: No  . Drug use: No     Allergies   Patient has no known allergies.   Review of Systems Review of Systems  All other systems reviewed and are negative.    Physical Exam Updated Vital Signs BP (!) 148/62   Pulse (!) 56   Temp 98.1 F (36.7 C) (Oral)   Resp 18   Ht 5\' 10"  (1.778 m)   Wt 90.3 kg   SpO2 100%   BMI 28.55 kg/m   Physical Exam Vitals signs and nursing note reviewed.  Constitutional:      Appearance: He is well-developed.  HENT:     Head: Normocephalic.  Neck:     Musculoskeletal: Normal range of motion.  Cardiovascular:     Rate and Rhythm: Normal rate and regular rhythm.  Pulmonary:     Effort: Pulmonary effort is normal. No respiratory distress.     Breath sounds: Normal breath sounds.     Comments: Right lateral inferior chest wall tenderness with small overlying bruising.  No crepitus. Chest:     Chest wall: Tenderness present.  Abdominal:     General: There is no distension.  Musculoskeletal: Normal range of motion.     Comments: 3 superficial skin tears of his right hand and right proximal posterior forearm without active bleeding.  Neurological:      Mental Status: He is alert and oriented to person, place, and time.      ED Treatments / Results  Labs (all labs ordered are listed, but only abnormal results are displayed) Labs Reviewed - No data to display  EKG None  Radiology Dg Ribs Unilateral W/chest Right  Result Date: 12/04/2018 CLINICAL DATA:  Pain and bruising of the right anterior lower rib secondary to a fall this morning in the kitchen. EXAM: RIGHT RIBS AND CHEST - 3+ VIEW COMPARISON:  None. FINDINGS: No fracture or other bone lesions are seen involving the ribs. There is no  evidence of pneumothorax or pleural effusion. Both lungs are clear. Heart size and mediastinal contours are within normal limits. Coronary artery stent in place. Aortic atherosclerosis. IMPRESSION: No visible rib fractures. Aortic Atherosclerosis (ICD10-I70.0). Electronically Signed   By: Francene Boyers M.D.   On: 12/04/2018 15:38    Procedures Procedures (including critical care time)  Medications Ordered in ED Medications  acetaminophen (TYLENOL) tablet 650 mg (650 mg Oral Given 12/04/18 1518)     Initial Impression / Assessment and Plan / ED Course  I have reviewed the triage vital signs and the nursing notes.  Pertinent labs & imaging results that were available during my care of the patient were reviewed by me and considered in my medical decision making (see chart for details).    Rib contusion.  No obvious fracture on x-ray.  No complicating feature of the right lung.  Standard precautions given.  Home with incentive spirometer.  Superficial skin tears of the right hand and proximal forearm.  Conservative treatment.  Close primary care follow-up.  Patient understands to return the emergency department for new or worsening symptoms  Final Clinical Impressions(s) / ED Diagnoses   Final diagnoses:  Rib contusion, right, initial encounter  Fall, initial encounter    ED Discharge Orders    None       Azalia Bilis, MD 12/04/18  1606

## 2018-12-04 NOTE — Discharge Instructions (Addendum)
Take ibuprofen and tylenol for the pain  Use the incentive spirometer 10 times every hour while awake  Return the emergency department as needed for new or worsening symptoms

## 2019-02-28 ENCOUNTER — Telehealth: Payer: Self-pay | Admitting: Cardiovascular Disease

## 2019-02-28 NOTE — Telephone Encounter (Signed)
Pt c/o of Chest Pain: STAT if CP now or developed within 24 hours  1. Are you having CP right now? NO  2. Are you experiencing any other symptoms (ex. SOB, nausea, vomiting, sweating)? CHEST PAIN AND DOWN LEFT ARM  3. How long have you been experiencing CP? Early this morning about 15 - 20 min  4. Is your CP continuous or coming and going? no  5. Have you taken Nitroglycerin? NO ?

## 2019-02-28 NOTE — Telephone Encounter (Signed)
I spoke to the patient who was awakened at 4:30 am with CP, SOB and pain radiating down his left arm. The pain subsided after about 10 minutes without the use of Nitro, but the patient could not go back to sleep.   He was told that if this occurs again, he should take Nitro and go to the ED.  He will call us on Monday and let us know how he did over the weekend for further evaluation.

## 2019-03-03 ENCOUNTER — Telehealth: Payer: Self-pay | Admitting: Cardiovascular Disease

## 2019-03-03 MED ORDER — ISOSORBIDE MONONITRATE ER 30 MG PO TB24: 30.0000 mg | ORAL_TABLET | Freq: Every day | ORAL | 11 refills | Status: DC

## 2019-03-03 MED ORDER — ISOSORBIDE MONONITRATE ER 60 MG PO TB24: 60.0000 mg | ORAL_TABLET | Freq: Every day | ORAL | 11 refills | Status: DC

## 2019-03-03 MED ORDER — NITROGLYCERIN 0.4 MG SL SUBL: 0.4000 mg | SUBLINGUAL_TABLET | SUBLINGUAL | 6 refills | Status: DC | PRN

## 2019-03-03 NOTE — Telephone Encounter (Signed)
Spoke with patient who states he is having SOB with exertion recently.  States discomfort woke him up Thursday morning and then he was unable to sleep on Thursday night, had to sleep sitting up in a chair. Had pain in his shoulder and going down his left arm. Denies symptoms at present. Denies dizziness or light-headedness, nausea; denies cough or fever. States he has a runny nose but that is not unusual Continues to take clopidogrel and states he is compliant with cardiac meds. Last [facility] March 2016. No pain since Friday morning. I advised him that I will review with Dr. Elease Hashimoto who is in the office and we will call him back later today. He verbalized understanding and agreement with plan and thanked me for the call.

## 2019-03-03 NOTE — Telephone Encounter (Signed)
Rx sent to [facility]. Called and spoke with Weston Brass, pharmacist and advised that Rx for isosorbide should be 30 mg daily not 60 mg daily. He verbalized understanding and I thanked him for his help.

## 2019-03-03 NOTE — Telephone Encounter (Signed)
Has been having more chest pain  For the past 3-4 days Seems to be related to overdoing it  Moving stuff around at a new house he is building  Has some DOE walking up the hill from his tractor shed  Has been worsening for the past several months .   feels very similar to his last angina .  Will stop Lisinopril 10 mg a day  Start Imdur 30 mg a day  Refill NTG  0.4 mg SL [facility]   He will touch base with [place] later this week I suspect he will need a cath but will try to delay the cath until the COVID 19 outbreak has improved.     Kristeen Miss, MD  03/03/2019 5:02 PM    [facility] [address] [place],  [address] [place], [place]  [address] Pager [phone] Phone: [phone]; Fax: [phone]

## 2019-03-03 NOTE — Telephone Encounter (Signed)
F/U Message              Patient was having chest pains on Friday, per the nurse, he was told to call back today. Patient would like a call back. Patient is not having chest pains at this time.

## 2019-03-04 NOTE — Telephone Encounter (Signed)
Anthony Carpenter is feeling better  BP is stable with the addition of the Imdur No syncope. No CP yet today He agreed that he would want to wait until the COVID 19 issue is resolved before he does a cath if possible

## 2019-03-04 NOTE — Telephone Encounter (Signed)
Follow up    Patient is calling to discuss his call from yesterday concerning his BP and how he is feeling. Please call.

## 2019-05-20 ENCOUNTER — Other Ambulatory Visit: Payer: Self-pay | Admitting: Cardiovascular Disease

## 2019-05-20 DIAGNOSIS — E785 Hyperlipidemia, unspecified: Secondary | ICD-10-CM

## 2019-05-30 ENCOUNTER — Telehealth: Payer: Self-pay | Admitting: Cardiovascular Disease

## 2019-05-30 ENCOUNTER — Ambulatory Visit: Payer: Medicare Other | Admitting: Cardiovascular Disease

## 2019-05-30 NOTE — Telephone Encounter (Signed)

## 2019-05-30 NOTE — Telephone Encounter (Signed)
New Message ° ° ° °Left message to confirm appt and answer COVID questions  °

## 2019-05-30 NOTE — Telephone Encounter (Signed)
Follow up  ° ° °Patient is returning call.  °

## 2019-06-02 ENCOUNTER — Other Ambulatory Visit: Payer: Self-pay

## 2019-06-02 ENCOUNTER — Ambulatory Visit (INDEPENDENT_AMBULATORY_CARE_PROVIDER_SITE_OTHER): Payer: Medicare Other | Admitting: Cardiovascular Disease

## 2019-06-02 ENCOUNTER — Encounter (INDEPENDENT_AMBULATORY_CARE_PROVIDER_SITE_OTHER): Payer: Self-pay

## 2019-06-02 ENCOUNTER — Encounter: Payer: Self-pay | Admitting: Cardiovascular Disease

## 2019-06-02 VITALS — BP 104/64 | HR 55 | Ht 70.0 in | Wt 198.0 lb

## 2019-06-02 DIAGNOSIS — I10 Essential (primary) hypertension: Secondary | ICD-10-CM | POA: Diagnosis not present

## 2019-06-02 DIAGNOSIS — I2510 Atherosclerotic heart disease of native coronary artery without angina pectoris: Secondary | ICD-10-CM | POA: Diagnosis not present

## 2019-06-02 DIAGNOSIS — I350 Nonrheumatic aortic (valve) stenosis: Secondary | ICD-10-CM | POA: Diagnosis not present

## 2019-06-02 DIAGNOSIS — E782 Mixed hyperlipidemia: Secondary | ICD-10-CM

## 2019-06-02 DIAGNOSIS — I200 Unstable angina: Secondary | ICD-10-CM

## 2019-06-02 NOTE — Progress Notes (Signed)
Cardiology Office Note   Date:  06/02/2019   ID:  Anthony Carpenter, DOB 29-May-1941, MRN [id number]  PCP:  Orpah Melter, Anthony Carpenter  Cardiologist:   Mertie Moores, Anthony Carpenter   Chief Complaint  Patient presents with  . Coronary Artery Disease    1. Coronary artery disease-status post PTCA and stenting of his mid LAD.  ( February 11, 2015)    We placed a 2.75 x 20 mm Taxus stent. It was post dilated using a 3.0 Quantum Maverick, 07/28/05  2. Hyperlipidemia-   3. Possible bicuspid aortic valve   Anthony Carpenter is doing well. No chest pain or dyspnea. He plans on putting in a garden this summer . He's building his house this year. He was admitted to the hospital with some chest pain/abdominal pain. A Myoview study did not reveal any ischemia.  He has felt better. No further episodes of chest pain. He forgot to take his meds this am.  Feb. 26, 2014: Anthony Carpenter is doing well. He has not had any angina. He remains active - still building houses.   July 14, 2013:  Anthony Carpenter is doing Well. No angina.   July 14, 2014:  Anthony Carpenter is doing well. Still working No CP, no dyspnea.   Dec. 11, 2015;  Anthony Carpenter was in the hospital for CP recently. He was having vague chest discomfort. Cath showed a 60 % instent restenosis. Pains have resolved. He is back doing all of his normal activities. BP has been lower   February 05, 2015:  Anthony Carpenter is a 78 y.o. male who presents for some CP and DOE for the past month. 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 Will last as long as he's working . Eases up if he stops.   March 17, 2015:  Anthony Carpenter is a 78 y.o. male who presents for follow up of an episode of unstable angina.  I saw him in March for symptoms worisome of UAP.  CAth showed.   Left main: No obstructive disease.   Left Anterior Descending Artery: Large caliber vessel that courses to the apex. The mid segment is stented. The old stent has 99% restenosis. The distal  vessel is free of obstructive disease. The Diagonal branch is moderate in caliber with ostial 30% stenosis where the vessel is jailed by the LAD stent.  Circumflex Artery: Large caliber vessel with moderate caliber obtuse marginal branch. The obtuse marginal branch has proximal 50% stenosis.  Right Coronary Artery: Large dominant vessel with 30% mid stenosis.  Left Ventricular Angiogram: LVEF=45-50% with hypokinesis of the anterior wall and apex.  He had 3.0 x 16 mm Promus Premier DES in the mid LAD. The stent was post-dilated with a 3.25 x 12 mm Hermitage balloon x 1. The stenosis was taken from 99% down to 0%.   He is feeling much better.   Not shortness of breath   Participating in cardiac rehab. Bruising from the plavix.   But no real PC   Oct. 24, 2016:  Anthony Carpenter is doing well. Walking regularly  Has several projects doing on. Developing some land  Has some bruising and occasional nose bleeds.   March 13, 2016:  Staying active.    Occasional nose bleeds   Oct. 23, 2017:  Doing well .  No CP or dyspnea. Cooked 80 gallons of [place] stew this weekend  Has occasional nose bleed   Mar 28, 2017:  Doing well.   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   Has lots of bruising but not so much nosebleeding. We have stopped the ASA and continued the plavix.    May 20, 2018: Anthony Carpenter is seen today for follow-up of his coronary artery disease and hyperlipidemia No CP ,  No dyspnea.   Still bruising.   Is still on Plavix,   Is off ASA  No recent nose bleeds  Jan. 6, 2020 Has mild DOE if he exrcises vigorously No return of the angina he had last year  June 02, 2019 :  Has CAD.   Was having angina,  Started Imdur.  Pain significantly improved. Not able to do as much as he thinks he would do .  Walks 1/2 mile without any problems.  Can climb 2 flights of stairs.   Has not needed NTG recently    Past Medical History:  Diagnosis Date  . Aortic  stenosis, mild   . Bicuspid aortic valve   . Bradycardia   . Carotid bruit present    a. Right side  . Coronary artery disease    a. CAD s/p PTCA/Taxus stent to mid-prox LAD 2006  b. cath 10/02/14 with 60% focal instent restenosis in LAD prox to the Diag takeoff, 30% mid AV groove left circ stenosis and luminal irregularities in a large dominant RCA. FFR of LAD was 0.85 and not felt to be HD significant to warrant PCI.  Marland Kitchen. Glaucoma   . Hyperlipidemia   . Hypertension   . Myocardial infarction (HCC) 2006  . Pulmonary nodule seen on imaging study    a. seen on CT during 09/2014 admission, may require follow up imaging     Past Surgical History:  Procedure Laterality Date  . CARDIAC CATHETERIZATION  07/28/2005   Est EF of 55% -- Single-vessel obstructive disease in the proximal/mid left anterior descending -- Preserved left ventricular systolic function with good anterior wall motion  . cataracts  5-6 years ago  . COLONOSCOPY  ? 2009  . CORONARY ANGIOPLASTY WITH STENT PLACEMENT  07/28/2005   Successful PTCA and stenting of the mid and proximal left anterior descending artery.  The patient is stable leaving the catheterization laboratory -- Anthony Carpenter, M.D.    . CORONARY STENT PLACEMENT  02/11/2015   3.0 x 16 mm Promus Premier DES to the mLAD for ISR  . LEFT HEART CATHETERIZATION WITH CORONARY ANGIOGRAM N/A 10/02/2014   Procedure: LEFT HEART CATHETERIZATION WITH CORONARY ANGIOGRAM;  Surgeon: Anthony Biharihomas A Kelly, Anthony Carpenter;  [facility];  Service: Cardiovascular;  Laterality: N/A;  . LEFT HEART CATHETERIZATION WITH CORONARY ANGIOGRAM N/A 02/11/2015   Procedure: LEFT HEART CATHETERIZATION WITH CORONARY ANGIOGRAM;  Surgeon: Anthony Hazelhristopher D McAlhany, Anthony Carpenter; LAD 99% ISR, CFX okay, OM1 50%, RCA 30%, D1 30% (jailed by the LAD stent), EF 45-50 %  . THULIUM LASER TURP (TRANSURETHRAL RESECTION OF PROSTATE) N/A 08/06/2018   Procedure: THULIUM LASER TURP (TRANSURETHRAL RESECTION OF PROSTATE);  Surgeon:  Anthony Carpenter, Matthew, Anthony Carpenter;  Location: [facility];  Service: Urology;  Laterality: N/A;     Current Outpatient Medications  Medication Sig Dispense Refill  . atorvastatin (LIPITOR) 80 MG tablet TAKE 1 TABLET DAILY AT 6PM 90 tablet 0  . clopidogrel (PLAVIX) 75 MG tablet TAKE 1 TABLET DAILY WITH   BREAKFAST 90 tablet 2  . dorzolamide-timolol (COSOPT) 22.3-6.8 MG/ML ophthalmic solution Place 1 drop into both eyes 2 (two) times daily.     . isosorbide mononitrate (IMDUR) 30 MG 24 hr tablet Take  1 tablet (30 mg total) by mouth daily. 30 tablet 11  . latanoprost (XALATAN) 0.005 % ophthalmic solution Place 1 drop into both eyes at bedtime.     Marland Kitchen. lisinopril (ZESTRIL) 10 MG tablet Take 1 tablet by mouth daily.    . Multiple Vitamins-Minerals (ICAPS AREDS FORMULA PO) Take 1 tablet by mouth 2 (two) times daily.     . nitroGLYCERIN (NITROSTAT) 0.4 MG SL tablet Place 1 tablet (0.4 mg total) under the tongue every 5 (five) minutes as needed. For chest pain. 25 tablet 6   No current facility-administered medications for this visit.     Allergies:   Patient has no known allergies.    Social History:  The patient  reports that he has never smoked. He has never used smokeless tobacco. He reports that he does not drink alcohol or use drugs.   Family History:  The patient's family history includes Cancer in an other family member; Lung cancer in his father.    ROS:   Noted in current history, otherwise review of systems is negative.  Physical Exam: Blood pressure 104/64, pulse (!) 55, height 5\' 10"  (1.778 m), weight 198 lb (89.8 kg), SpO2 98 %.  GEN:  Well nourished, well developed in no acute distress HEENT: Normal NECK: No JVD; No carotid bruits LYMPHATICS: No lymphadenopathy CARDIAC: RRR , soft systolic murmur  RESPIRATORY:  Clear to auscultation without rales, wheezing or rhonchi  ABDOMEN: Soft, non-tender, non-distended MUSCULOSKELETAL:  No edema; No deformity  SKIN: Warm and dry NEUROLOGIC:  Alert  and oriented x 3   EKG:      Recent Labs: 07/30/2018: BUN 12; Creatinine, Ser 0.92; Hemoglobin 14.8; Platelets 141; Potassium 4.3; Sodium 144    Lipid Panel    Component Value Date/Time   CHOL 110 05/20/2018 0923   TRIG 62 05/20/2018 0923   HDL 41 05/20/2018 0923   CHOLHDL 2.7 05/20/2018 0923   CHOLHDL 2.8 09/11/2016 0904   VLDL 14 09/11/2016 0904   LDLCALC 57 05/20/2018 0923      Wt Readings from Last 3 Encounters:  06/02/19 198 lb (89.8 kg)  12/04/18 199 lb (90.3 kg)  11/25/18 199 lb (90.3 kg)      Other studies Reviewed: Additional studies/ records that were reviewed today include: . Review of the above records demonstrates:    ASSESSMENT AND PLAN:   1. Coronary artery disease-status post PTCA and stenting of his mid LAD. We placed a 2.75 x 20 mm Taxus stent,  Now with a 3.0 x 16 mm Promus Premier DES in the mid LAD.    Has had some angina  ,  Much better with the imdur .  He would like to wait and reassess in several months before we dicede on whether or not to cath .   2. Hyperlipidemia-     Check lipids this week    3.  Mild aortic stenosis:   Stable    Current medicines are reviewed at length with the patient today.  The patient does not have concerns regarding medicines.  The following changes have been made:  no change  Labs/ tests ordered today include:   No orders of the defined types were placed in this encounter.   Disposition:   FU with me in 3  months       Signed, Kristeen MissPhilip Kerrianne Jeng, Anthony Carpenter  06/02/2019 2:14 PM    [facility] [address], [place], [place]  [address] Phone: [phone]; Fax: [phone]  

## 2019-06-02 NOTE — Patient Instructions (Addendum)
Medication Instructions:  Your physician recommends that you continue on your current medications as directed. Please refer to the Current Medication list given to you today.  If you need a refill on your cardiac medications before your next appointment, please call your pharmacy.     Lab work: Your physician recommends that you return for lab work on Thursday July 16 You may come into our office anytime after 7:30 am for your lab work You will need to FAST for this appointment - nothing to eat or drink after midnight the night before except water.    Testing/Procedures: None Ordered    Follow-Up: At [facility], you and your health needs are our priority.  As part of our continuing mission to provide you with exceptional heart care, we have created designated Provider Care Teams.  These Care Teams include your primary Cardiologist (physician) and Advanced Practice Providers (APPs -  Physician Assistants and Nurse Practitioners) who all work together to provide you with the care you need, when you need it. You will need a follow up appointment in:  3 months on Thursday Oct. 15 with  Mertie Moores, MD. In the future, you may see one of the following Advanced Practice Providers on your designated Care Team: Richardson Dopp, PA-C [place], [place] . Daune Perch, NP

## 2019-06-05 ENCOUNTER — Other Ambulatory Visit: Payer: Medicare Other

## 2019-06-05 ENCOUNTER — Ambulatory Visit: Payer: Medicare Other | Admitting: Cardiovascular Disease

## 2019-06-12 ENCOUNTER — Other Ambulatory Visit: Payer: Self-pay

## 2019-06-12 ENCOUNTER — Other Ambulatory Visit: Payer: Medicare Other | Admitting: *Deleted

## 2019-06-12 DIAGNOSIS — E782 Mixed hyperlipidemia: Secondary | ICD-10-CM

## 2019-06-12 DIAGNOSIS — I2510 Atherosclerotic heart disease of native coronary artery without angina pectoris: Secondary | ICD-10-CM

## 2019-06-12 DIAGNOSIS — I350 Nonrheumatic aortic (valve) stenosis: Secondary | ICD-10-CM

## 2019-06-12 LAB — BASIC METABOLIC PANEL
BUN/Creatinine Ratio: 11 (ref 10–24)
BUN: 11 mg/dL (ref 8–27)
CO2: 23 mmol/L (ref 20–29)
Calcium: 9.4 mg/dL (ref 8.6–10.2)
Chloride: 105 mmol/L (ref 96–106)
Creatinine, Ser: 1 mg/dL (ref 0.76–1.27)
GFR calc Af Amer: 82 mL/min/{1.73_m2} (ref >59)
GFR calc non Af Amer: 71 mL/min/{1.73_m2} (ref >59)
Glucose: 99 mg/dL (ref 65–99)
Potassium: 4 mmol/L (ref 3.5–5.2)
Sodium: 142 mmol/L (ref 134–144)

## 2019-06-12 LAB — HEPATIC FUNCTION PANEL
ALT: 16 IU/L (ref 0–44)
AST: 19 IU/L (ref 0–40)
Albumin: 4.3 g/dL (ref 3.7–4.7)
Alkaline Phosphatase: 85 IU/L (ref 39–117)
Bilirubin Total: 0.6 mg/dL (ref 0.0–1.2)
Bilirubin, Direct: 0.18 mg/dL (ref 0.00–0.40)
Total Protein: 6.5 g/dL (ref 6.0–8.5)

## 2019-06-12 LAB — LIPID PANEL
Chol/HDL Ratio: 2.7 ratio (ref 0.0–5.0)
Cholesterol, Total: 102 mg/dL (ref 100–199)
HDL: 38 mg/dL — ABNORMAL LOW (ref >39)
LDL Calculated: 51 mg/dL (ref 0–99)
Triglycerides: 64 mg/dL (ref 0–149)
VLDL Cholesterol Cal: 13 mg/dL (ref 5–40)

## 2019-06-22 ENCOUNTER — Other Ambulatory Visit: Payer: Self-pay | Admitting: Cardiovascular Disease

## 2019-07-10 ENCOUNTER — Other Ambulatory Visit: Payer: Self-pay | Admitting: Cardiovascular Disease

## 2019-07-18 ENCOUNTER — Other Ambulatory Visit: Payer: Self-pay | Admitting: Cardiovascular Disease

## 2019-07-18 MED ORDER — ATORVASTATIN CALCIUM 80 MG PO TABS: ORAL_TABLET | ORAL | 3 refills | Status: DC

## 2019-07-18 NOTE — Telephone Encounter (Signed)
Pt's medication was sent to pt's pharmacy as requested. Confirmation received.  °

## 2019-09-04 ENCOUNTER — Other Ambulatory Visit: Payer: Self-pay

## 2019-09-04 ENCOUNTER — Encounter (INDEPENDENT_AMBULATORY_CARE_PROVIDER_SITE_OTHER): Payer: Self-pay

## 2019-09-04 ENCOUNTER — Ambulatory Visit: Payer: Medicare Other | Admitting: Cardiovascular Disease

## 2019-09-04 ENCOUNTER — Encounter: Payer: Self-pay | Admitting: Cardiovascular Disease

## 2019-09-04 VITALS — BP 130/62 | HR 52 | Ht 70.0 in | Wt 191.8 lb

## 2019-09-04 DIAGNOSIS — I350 Nonrheumatic aortic (valve) stenosis: Secondary | ICD-10-CM | POA: Diagnosis not present

## 2019-09-04 DIAGNOSIS — E782 Mixed hyperlipidemia: Secondary | ICD-10-CM

## 2019-09-04 DIAGNOSIS — I2510 Atherosclerotic heart disease of native coronary artery without angina pectoris: Secondary | ICD-10-CM

## 2019-09-04 DIAGNOSIS — I10 Essential (primary) hypertension: Secondary | ICD-10-CM | POA: Diagnosis not present

## 2019-09-04 MED ORDER — NITROGLYCERIN 0.4 MG SL SUBL: 0.4000 mg | SUBLINGUAL_TABLET | SUBLINGUAL | 12 refills | Status: DC | PRN

## 2019-09-04 NOTE — Patient Instructions (Addendum)
Medication Instructions:  Your physician recommends that you continue on your current medications as directed. Please refer to the Current Medication list given to you today. **A Refill of your nitroglycerin has been sent to your pharmacy  *If you need a refill on your cardiac medications before your next appointment, please call your pharmacy*   Lab Work: Your physician recommends that you return for lab work in: 6 months on the day of or a few days before your office visit with Dr. Acie Fredrickson.  You will need to FAST for this appointment - nothing to eat or drink after midnight the night before except water.    Testing/Procedures: None Ordered   Follow-Up: At [facility], you and your health needs are our priority.  As part of our continuing mission to provide you with exceptional heart care, we have created designated Provider Care Teams.  These Care Teams include your primary Cardiologist (physician) and Advanced Practice Providers (APPs -  Physician Assistants and Nurse Practitioners) who all work together to provide you with the care you need, when you need it.  Your next appointment:   6 months  The format for your next appointment:   In Person  Provider:   You may see Mertie Moores, MD or one of the following Advanced Practice Providers on your designated Care Team:    Richardson Dopp, PA-C  [place], [place]  Daune Perch, [place]

## 2019-09-04 NOTE — Progress Notes (Signed)
Cardiology Office Note   Date:  09/04/2019   ID:  Anthony JonesJohn R Whobrey, DOB 09/19/1941, MRN [id number]  PCP:  Joycelyn RuaMeyers, Stephen, MD  Cardiologist:   Kristeen MissPhilip Nahser, MD   No chief complaint on file.   1. Coronary artery disease-status post PTCA and stenting of his mid LAD.  ( February 11, 2015)    We placed a 2.75 x 20 mm Taxus stent. It was post dilated using a 3.0 Quantum Maverick, 07/28/05  2. Hyperlipidemia-   3. Possible bicuspid aortic valve   Anthony Carpenter is doing well. No chest pain or dyspnea. He plans on putting in a garden this summer . He's building his house this year. He was admitted to the hospital with some chest pain/abdominal pain. A Myoview study did not reveal any ischemia.  He has felt better. No further episodes of chest pain. He forgot to take his meds this am.  Feb. 26, 2014: Anthony Carpenter is doing well. He has not had any angina. He remains active - still building houses.   July 14, 2013:  Anthony Carpenter is doing Well. No angina.   July 14, 2014:  Anthony Carpenter is doing well. Still working No CP, no dyspnea.   Dec. 11, 2015;  Anthony Carpenter was in the hospital for CP recently. He was having vague chest discomfort. Cath showed a 60 % instent restenosis. Pains have resolved. He is back doing all of his normal activities. BP has been lower   February 05, 2015:  Anthony Carpenter is a 78 y.o. male who presents for some CP and DOE for the past month. 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 Will last as long as he's working . Eases up if he stops.   March 17, 2015:  Anthony JonesJohn R Gurevich is a 78 y.o. male who presents for follow up of an episode of unstable angina.  I saw him in March for symptoms worisome of UAP.  CAth showed.   Left main: No obstructive disease.   Left Anterior Descending Artery: Large caliber vessel that courses to the apex. The mid segment is stented. The old stent has 99% restenosis. The distal vessel is free of obstructive disease. The  Diagonal branch is moderate in caliber with ostial 30% stenosis where the vessel is jailed by the LAD stent.  Circumflex Artery: Large caliber vessel with moderate caliber obtuse marginal branch. The obtuse marginal branch has proximal 50% stenosis.  Right Coronary Artery: Large dominant vessel with 30% mid stenosis.  Left Ventricular Angiogram: LVEF=45-50% with hypokinesis of the anterior wall and apex.  He had 3.0 x 16 mm Promus Premier DES in the mid LAD. The stent was post-dilated with a 3.25 x 12 mm [place] balloon x 1. The stenosis was taken from 99% down to 0%.   He is feeling much better.   Not shortness of breath   Participating in cardiac rehab. Bruising from the plavix.   But no real PC   Oct. 24, 2016:  Anthony Carpenter is doing well. Walking regularly  Has several projects doing on. Developing some land  Has some bruising and occasional nose bleeds.   March 13, 2016:  Staying active.    Occasional nose bleeds   Oct. 23, 2017:  Doing well .  No CP or dyspnea. Cooked 80 gallons of [place] stew this weekend  Has occasional nose bleed   Mar 28, 2017:  Doing well.   No angina Working on his housing development   October 04, 2017:  Doing  well .   Has lots of bruising but not so much nosebleeding. We have stopped the ASA and continued the plavix.    May 20, 2018: Anthony Noble is seen today for follow-up of his coronary artery disease and hyperlipidemia No CP ,  No dyspnea.   Still bruising.   Is still on Plavix,   Is off ASA  No recent nose bleeds  Jan. 6, 2020 Has mild DOE if he exrcises vigorously No return of the angina he had last year  June 02, 2019 :  Has CAD.   Was having angina,  Started Imdur.  Pain significantly improved. Not able to do as much as he thinks he would do .  Walks 1/2 mile without any problems.  Can climb 2 flights of stairs.   Has not needed NTG recently   September 04, 2019:  Anthony Noble is seen today for a follow-up visit.  He has a history of coronary  artery disease.  When I last saw him in July he was having some unusual types of chest pains.  He was able to exercise without too much difficulty.  He did not want have a heart catheterization but wanted to be reevaluated in several months and returns today for further evaluation.  Still has some CP if he "rushes" too much   Has some angina - not as bad as his previous CP.  Imdur seems to help     Past Medical History:  Diagnosis Date  . Aortic stenosis, mild   . Bicuspid aortic valve   . Bradycardia   . Carotid bruit present    a. Right side  . Coronary artery disease    a. CAD s/p PTCA/Taxus stent to mid-prox LAD 2006  b. cath 10/02/14 with 60% focal instent restenosis in LAD prox to the Diag takeoff, 30% mid AV groove left circ stenosis and luminal irregularities in a large dominant RCA. FFR of LAD was 0.85 and not felt to be HD significant to warrant PCI.  Marland Kitchen Glaucoma   . Hyperlipidemia   . Hypertension   . Myocardial infarction (HCC) 2006  . Pulmonary nodule seen on imaging study    a. seen on CT during 09/2014 admission, may require follow up imaging     Past Surgical History:  Procedure Laterality Date  . CARDIAC CATHETERIZATION  07/28/2005   Est EF of 55% -- Single-vessel obstructive disease in the proximal/mid left anterior descending -- Preserved left ventricular systolic function with good anterior wall motion  . cataracts  5-6 years ago  . COLONOSCOPY  ? 2009  . CORONARY ANGIOPLASTY WITH STENT PLACEMENT  07/28/2005   Successful PTCA and stenting of the mid and proximal left anterior descending artery.  The patient is stable leaving the catheterization laboratory -- Vesta Mixer, M.D.    . CORONARY STENT PLACEMENT  02/11/2015   3.0 x 16 mm Promus Premier DES to the mLAD for ISR  . LEFT HEART CATHETERIZATION WITH CORONARY ANGIOGRAM N/A 10/02/2014   Procedure: LEFT HEART CATHETERIZATION WITH CORONARY ANGIOGRAM;  Surgeon: Lennette Bihari, MD;  Location: [facility] CATH LAB;   Service: Cardiovascular;  Laterality: N/A;  . LEFT HEART CATHETERIZATION WITH CORONARY ANGIOGRAM N/A 02/11/2015   Procedure: LEFT HEART CATHETERIZATION WITH CORONARY ANGIOGRAM;  Surgeon: Kathleene Hazel, MD; LAD 99% ISR, CFX okay, OM1 50%, RCA 30%, D1 30% (jailed by the LAD stent), EF 45-50 %  . THULIUM LASER TURP (TRANSURETHRAL RESECTION OF PROSTATE) N/A 08/06/2018   Procedure: THULIUM LASER TURP (  TRANSURETHRAL RESECTION OF PROSTATE);  Surgeon: Festus Aloe, MD;  Location: [facility] ORS;  Service: Urology;  Laterality: N/A;     Current Outpatient Medications  Medication Sig Dispense Refill  . atorvastatin (LIPITOR) 80 MG tablet TAKE 1 TABLET DAILY AT 6PM 90 tablet 3  . clopidogrel (PLAVIX) 75 MG tablet Take 1 tablet (75 mg total) by mouth daily with breakfast. 90 tablet 2  . dorzolamide-timolol (COSOPT) 22.3-6.8 MG/ML ophthalmic solution Place 1 drop into both eyes 2 (two) times daily.     . isosorbide mononitrate (IMDUR) 30 MG 24 hr tablet Take 1 tablet (30 mg total) by mouth daily. 30 tablet 11  . latanoprost (XALATAN) 0.005 % ophthalmic solution Place 1 drop into both eyes at bedtime.     Marland Kitchen lisinopril (ZESTRIL) 10 MG tablet TAKE 1 TABLET DAILY 90 tablet 3  . Multiple Vitamins-Minerals (ICAPS AREDS FORMULA PO) Take 1 tablet by mouth 2 (two) times daily.     . nitroGLYCERIN (NITROSTAT) 0.4 MG SL tablet Place 1 tablet (0.4 mg total) under the tongue every 5 (five) minutes as needed. For chest pain. 25 tablet 12   No current facility-administered medications for this visit.     Allergies:   Patient has no known allergies.    Social History:  The patient  reports that he has never smoked. He has never used smokeless tobacco. He reports that he does not drink alcohol or use drugs.   Family History:  The patient's family history includes Cancer in an other family member; Lung cancer in his father.    ROS:   Noted in current history, otherwise review of systems is negative.  Physical  Exam: Blood pressure 130/62, pulse (!) 52, height 5\' 10"  (1.778 m), weight 191 lb 12.8 oz (87 kg), SpO2 97 %.  GEN:  Well nourished, well developed in no acute distress HEENT: Normal NECK: No JVD; No carotid bruits LYMPHATICS: No lymphadenopathy CARDIAC: RRR , no murmurs, rubs, gallops RESPIRATORY:  Clear to auscultation without rales, wheezing or rhonchi  ABDOMEN: Soft, non-tender, non-distended MUSCULOSKELETAL:  No edema; No deformity  SKIN: Warm and dry NEUROLOGIC:  Alert and oriented x 3   EKG: September 04, 2019: Sinus bradycardia 52 beats a minute.  No ST or T wave changes.    Recent Labs: 06/12/2019: ALT 16; BUN 11; Creatinine, Ser 1.00; Potassium 4.0; Sodium 142    Lipid Panel    Component Value Date/Time   CHOL 102 06/12/2019 0731   TRIG 64 06/12/2019 0731   HDL 38 (L) 06/12/2019 0731   CHOLHDL 2.7 06/12/2019 0731   CHOLHDL 2.8 09/11/2016 0904   VLDL 14 09/11/2016 0904   LDLCALC 51 06/12/2019 0731      Wt Readings from Last 3 Encounters:  09/04/19 191 lb 12.8 oz (87 kg)  06/02/19 198 lb (89.8 kg)  12/04/18 199 lb (90.3 kg)      Other studies Reviewed: Additional studies/ records that were reviewed today include: . Review of the above records demonstrates:    ASSESSMENT AND PLAN:   1. Coronary artery disease-status post PTCA and stenting of his mid LAD. We placed a 2.75 x 20 mm Taxus stent,  Now with a 3.0 x 16 mm Promus Premier DES in the mid LAD.   His symptoms sound stable.  He does not want to pursue heart catheterization at this time.  He is able to do all of his normal activities and also walks several times a week without significant angina.  He does  have some angina if he pushes it too hard.  I will see him again in 6 months and will continue to reassess him.  He will call  [phone] if he develops any severe chest pain.  2. Hyperlipidemia-     We will check labs again in 6 months.     Current medicines are reviewed at length with the patient today.   The patient does not have concerns regarding medicines.  The following changes have been made:  no change  Labs/ tests ordered today include:   Orders Placed This Encounter  Procedures  . Lipid Profile  . Basic Metabolic Panel (BMET)  . Hepatic function panel  . EKG 12-Lead    Disposition:   FU with me in 3  months       Signed, Kristeen Miss, MD  09/04/2019 10:03 AM    [facility] [address] [place], [place], [place]  [address] Phone: [phone]; Fax: [phone]

## 2020-01-22 ENCOUNTER — Other Ambulatory Visit: Payer: Self-pay

## 2020-01-22 MED ORDER — ISOSORBIDE MONONITRATE ER 30 MG PO TB24: 30.0000 mg | ORAL_TABLET | Freq: Every day | ORAL | 11 refills | Status: DC

## 2020-01-30 ENCOUNTER — Other Ambulatory Visit: Payer: Self-pay | Admitting: Cardiovascular Disease

## 2020-02-15 ENCOUNTER — Other Ambulatory Visit: Payer: Self-pay | Admitting: Cardiovascular Disease

## 2020-03-02 ENCOUNTER — Other Ambulatory Visit: Payer: Self-pay

## 2020-03-02 ENCOUNTER — Other Ambulatory Visit: Payer: Medicare Other

## 2020-03-02 ENCOUNTER — Ambulatory Visit: Payer: Medicare Other | Admitting: Cardiovascular Disease

## 2020-03-02 ENCOUNTER — Encounter: Payer: Self-pay | Admitting: Cardiovascular Disease

## 2020-03-02 VITALS — BP 124/68 | HR 49 | Ht 70.0 in | Wt 198.8 lb

## 2020-03-02 DIAGNOSIS — I200 Unstable angina: Secondary | ICD-10-CM | POA: Diagnosis not present

## 2020-03-02 DIAGNOSIS — E782 Mixed hyperlipidemia: Secondary | ICD-10-CM

## 2020-03-02 DIAGNOSIS — I350 Nonrheumatic aortic (valve) stenosis: Secondary | ICD-10-CM

## 2020-03-02 DIAGNOSIS — I2510 Atherosclerotic heart disease of native coronary artery without angina pectoris: Secondary | ICD-10-CM

## 2020-03-02 DIAGNOSIS — I10 Essential (primary) hypertension: Secondary | ICD-10-CM | POA: Diagnosis not present

## 2020-03-02 LAB — BASIC METABOLIC PANEL
BUN/Creatinine Ratio: 17 (ref 10–24)
BUN: 14 mg/dL (ref 8–27)
CO2: 24 mmol/L (ref 20–29)
Calcium: 9.2 mg/dL (ref 8.6–10.2)
Chloride: 105 mmol/L (ref 96–106)
Creatinine, Ser: 0.84 mg/dL (ref 0.76–1.27)
GFR calc Af Amer: 96 mL/min/{1.73_m2} (ref >59)
GFR calc non Af Amer: 83 mL/min/{1.73_m2} (ref >59)
Glucose: 88 mg/dL (ref 65–99)
Potassium: 4.1 mmol/L (ref 3.5–5.2)
Sodium: 141 mmol/L (ref 134–144)

## 2020-03-02 LAB — LIPID PANEL
Chol/HDL Ratio: 2.9 ratio (ref 0.0–5.0)
Cholesterol, Total: 95 mg/dL — ABNORMAL LOW (ref 100–199)
HDL: 33 mg/dL — ABNORMAL LOW (ref >39)
LDL Chol Calc (NIH): 48 mg/dL (ref 0–99)
Triglycerides: 64 mg/dL (ref 0–149)
VLDL Cholesterol Cal: 14 mg/dL (ref 5–40)

## 2020-03-02 LAB — HEPATIC FUNCTION PANEL
ALT: 16 IU/L (ref 0–44)
AST: 17 IU/L (ref 0–40)
Albumin: 4.2 g/dL (ref 3.7–4.7)
Alkaline Phosphatase: 97 IU/L (ref 39–117)
Bilirubin Total: 0.6 mg/dL (ref 0.0–1.2)
Bilirubin, Direct: 0.2 mg/dL (ref 0.00–0.40)
Total Protein: 6.3 g/dL (ref 6.0–8.5)

## 2020-03-02 NOTE — Progress Notes (Signed)
Cardiology Office Note   Date:  03/02/2020   ID:  Anthony Carpenter, DOB 12/04/1940, MRN [id number]  PCP:  Joycelyn Rua, MD  Cardiologist:   Kristeen Miss, MD   Chief Complaint  Patient presents with  . Coronary Artery Disease  . Hyperlipidemia    1. Coronary artery disease-status post PTCA and stenting of his mid LAD.  ( February 11, 2015)    We placed a 2.75 x 20 mm Taxus stent. It was post dilated using a 3.0 Quantum Maverick, 07/28/05  2. Hyperlipidemia-   3. Possible bicuspid aortic valve   Anthony Carpenter is doing well. No chest pain or dyspnea. He plans on putting in a garden this summer . He's building his house this year. He was admitted to the hospital with some chest pain/abdominal pain. A Myoview study did not reveal any ischemia.  He has felt better. No further episodes of chest pain. He forgot to take his meds this am.  Feb. 26, 2014: Anthony Carpenter is doing well. He has not had any angina. He remains active - still building houses.   July 14, 2013:  Anthony Carpenter is doing Well. No angina.   July 14, 2014:  Anthony Carpenter is doing well. Still working No CP, no dyspnea.   Dec. 11, 2015;  Anthony Carpenter was in the hospital for CP recently. He was having vague chest discomfort. Cath showed a 60 % instent restenosis. Pains have resolved. He is back doing all of his normal activities. BP has been lower   February 05, 2015:  Anthony Carpenter is a 79 y.o. male who presents for some CP and DOE for the past month. 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 Will last as long as he's working . Eases up if he stops.   March 17, 2015:  Anthony Carpenter is a 79 y.o. male who presents for follow up of an episode of unstable angina.  I saw him in March for symptoms worisome of UAP.  CAth showed.   Left main: No obstructive disease.   Left Anterior Descending Artery: Large caliber vessel that courses to the apex. The mid segment is stented. The old stent has 99%  restenosis. The distal vessel is free of obstructive disease. The Diagonal branch is moderate in caliber with ostial 30% stenosis where the vessel is jailed by the LAD stent.  Circumflex Artery: Large caliber vessel with moderate caliber obtuse marginal branch. The obtuse marginal branch has proximal 50% stenosis.  Right Coronary Artery: Large dominant vessel with 30% mid stenosis.  Left Ventricular Angiogram: LVEF=45-50% with hypokinesis of the anterior wall and apex.  He had 3.0 x 16 mm Promus Premier DES in the mid LAD. The stent was post-dilated with a 3.25 x 12 mm [place] balloon x 1. The stenosis was taken from 99% down to 0%.   He is feeling much better.   Not shortness of breath   Participating in cardiac rehab. Bruising from the plavix.   But no real PC   Oct. 24, 2016:  Anthony Carpenter is doing well. Walking regularly  Has several projects doing on. Developing some land  Has some bruising and occasional nose bleeds.   March 13, 2016:  Staying active.    Occasional nose bleeds   Oct. 23, 2017:  Doing well .  No CP or dyspnea. Cooked 80 gallons of [place] stew this weekend  Has occasional nose bleed   Mar 28, 2017:  Doing well.   No angina Working on  his housing development   October 04, 2017:  Doing well .   Has lots of bruising but not so much nosebleeding. We have stopped the ASA and continued the plavix.    May 20, 2018: Anthony Carpenter is seen today for follow-up of his coronary artery disease and hyperlipidemia No CP ,  No dyspnea.   Still bruising.   Is still on Plavix,   Is off ASA  No recent nose bleeds  Jan. 6, 2020 Has mild DOE if he exrcises vigorously No return of the angina he had last year  June 02, 2019 :  Has CAD.   Was having angina,  Started Imdur.  Pain significantly improved. Not able to do as much as he thinks he would do .  Walks 1/2 mile without any problems.  Can climb 2 flights of stairs.   Has not needed NTG recently   September 04, 2019:  Anthony Carpenter is  seen today for a follow-up visit.  He has a history of coronary artery disease.  When I last saw him in July he was having some unusual types of chest pains.  He was able to exercise without too much difficulty.  He did not want have a heart catheterization but wanted to be reevaluated in several months and returns today for further evaluation.  Still has some CP if he "rushes" too much   Has some angina - not as bad as his previous CP.  Imdur seems to help   March 02, 2020: Anthony Carpenter is seen today for follow-up of his coronary artery disease.  He has had some unusual type of chest pains in the past but he did not want to have a heart catheterization. Blood pressure and heart rate remain well controlled. Can walk for abut a mile ,   Gets out of breath if he rushes too much .  Has not had to take a NTG .   Past Medical History:  Diagnosis Date  . Aortic stenosis, mild   . Bicuspid aortic valve   . Bradycardia   . Carotid bruit present    a. Right side  . Coronary artery disease    a. CAD s/p PTCA/Taxus stent to mid-prox LAD 2006  b. cath 10/02/14 with 60% focal instent restenosis in LAD prox to the Diag takeoff, 30% mid AV groove left circ stenosis and luminal irregularities in a large dominant RCA. FFR of LAD was 0.85 and not felt to be HD significant to warrant PCI.  Marland Kitchen Glaucoma   . Hyperlipidemia   . Hypertension   . Myocardial infarction (HCC) 2006  . Pulmonary nodule seen on imaging study    a. seen on CT during 09/2014 admission, may require follow up imaging     Past Surgical History:  Procedure Laterality Date  . CARDIAC CATHETERIZATION  07/28/2005   Est EF of 55% -- Single-vessel obstructive disease in the proximal/mid left anterior descending -- Preserved left ventricular systolic function with good anterior wall motion  . cataracts  5-6 years ago  . COLONOSCOPY  ? 2009  . CORONARY ANGIOPLASTY WITH STENT PLACEMENT  07/28/2005   Successful PTCA and stenting of the mid and  proximal left anterior descending artery.  The patient is stable leaving the catheterization laboratory -- Vesta Mixer, M.D.    . CORONARY STENT PLACEMENT  02/11/2015   3.0 x 16 mm Promus Premier DES to the mLAD for ISR  . LEFT HEART CATHETERIZATION WITH CORONARY ANGIOGRAM N/A 10/02/2014   Procedure: LEFT  HEART CATHETERIZATION WITH CORONARY ANGIOGRAM;  Surgeon: Troy Sine, MD;  Location: [facility] CATH LAB;  Service: Cardiovascular;  Laterality: N/A;  . LEFT HEART CATHETERIZATION WITH CORONARY ANGIOGRAM N/A 02/11/2015   Procedure: LEFT HEART CATHETERIZATION WITH CORONARY ANGIOGRAM;  Surgeon: Burnell Blanks, MD; LAD 99% ISR, CFX okay, OM1 50%, RCA 30%, D1 30% (jailed by the LAD stent), EF 45-50 %  . THULIUM LASER TURP (TRANSURETHRAL RESECTION OF PROSTATE) N/A 08/06/2018   Procedure: THULIUM LASER TURP (TRANSURETHRAL RESECTION OF PROSTATE);  Surgeon: Festus Aloe, MD;  Location: [facility]S;  Service: Urology;  Laterality: N/A;     Current Outpatient Medications  Medication Sig Dispense Refill  . atorvastatin (LIPITOR) 80 MG tablet TAKE 1 TABLET DAILY AT 6PM 90 tablet 3  . clopidogrel (PLAVIX) 75 MG tablet TAKE 1 TABLET BY MOUTH  DAILY WITH BREAKFAST 90 tablet 1  . dorzolamide-timolol (COSOPT) 22.3-6.8 MG/ML ophthalmic solution Place 1 drop into both eyes 2 (two) times daily.     . isosorbide mononitrate (IMDUR) 30 MG 24 hr tablet TAKE 1 TABLET BY MOUTH EVERY DAY 90 tablet 1  . latanoprost (XALATAN) 0.005 % ophthalmic solution Place 1 drop into both eyes at bedtime.     Marland Kitchen lisinopril (ZESTRIL) 10 MG tablet TAKE 1 TABLET DAILY 90 tablet 3  . Multiple Vitamins-Minerals (ICAPS AREDS FORMULA PO) Take 1 tablet by mouth 2 (two) times daily.     . nitroGLYCERIN (NITROSTAT) 0.4 MG SL tablet Place 1 tablet (0.4 mg total) under the tongue every 5 (five) minutes as needed. For chest pain. 25 tablet 12   No current facility-administered medications for this visit.    Allergies:   Patient has no  known allergies.    Social History:  The patient  reports that he has never smoked. He has never used smokeless tobacco. He reports that he does not drink alcohol or use drugs.   Family History:  The patient's family history includes Cancer in an other family member; Lung cancer in his father.    ROS:   Noted in current history, otherwise review of systems is negative.   Physical Exam: Blood pressure 124/68, pulse (!) 49, height 5\' 10"  (1.778 m), weight 198 lb 12.8 oz (90.2 kg), SpO2 98 %.  GEN: Elderly gentleman, no acute distress HEENT: Normal NECK: No JVD; No carotid bruits LYMPHATICS: No lymphadenopathy CARDIAC: RRR , no murmurs, rubs, gallops RESPIRATORY:  Clear to auscultation without rales, wheezing or rhonchi  ABDOMEN: Soft, non-tender, non-distended MUSCULOSKELETAL:  No edema; No deformity  SKIN: Warm and dry NEUROLOGIC:  Alert and oriented x 3   EKG:    Recent Labs: 06/12/2019: ALT 16; BUN 11; Creatinine, Ser 1.00; Potassium 4.0; Sodium 142    Lipid Panel    Component Value Date/Time   CHOL 102 06/12/2019 0731   TRIG 64 06/12/2019 0731   HDL 38 (L) 06/12/2019 0731   CHOLHDL 2.7 06/12/2019 0731   CHOLHDL 2.8 09/11/2016 0904   VLDL 14 09/11/2016 0904   LDLCALC 51 06/12/2019 0731      Wt Readings from Last 3 Encounters:  03/02/20 198 lb 12.8 oz (90.2 kg)  09/04/19 191 lb 12.8 oz (87 kg)  06/02/19 198 lb (89.8 kg)      Other studies Reviewed: Additional studies/ records that were reviewed today include: . Review of the above records demonstrates:    ASSESSMENT AND PLAN:   1. Coronary artery disease-status post PTCA and stenting of his mid LAD. We placed a 2.75 x  20 mm Taxus stent,  Now with a 3.0 x 16 mm Promus Premier DES in the mid LAD.   He is not had any episodes of clear-cut angina.  He has some unusual chest discomfort but they are not similar to his previous episodes of chest pain.  He is overall done well.  He does have some shortness of breath  with exertion but we discussed the fact that this may be just due to his age.  We will continue to keep a close eye on him.  I will see him again in 6 months.    2. Hyperlipidemia-    draw lipids, liver enzymes, basic metabolic profile today.     Current medicines are reviewed at length with the patient today.  The patient does not have concerns regarding medicines.  The following changes have been made:  no change  Labs/ tests ordered today include:   No orders of the defined types were placed in this encounter.   Disposition:   FU with me in 6 months      Signed, Kristeen Miss, MD  03/02/2020 9:40 AM    [facility] [address] [place], [place], [place]  [address] Phone: [phone]; Fax: [phone]

## 2020-03-02 NOTE — Patient Instructions (Signed)
Medication Instructions:  Your physician recommends that you continue on your current medications as directed. Please refer to the Current Medication list given to you today.  *If you need a refill on your cardiac medications before your next appointment, please call your pharmacy*   Lab Work: TODAY - cholesterol, liver panel, basic metabolic panel If you have labs (blood work) drawn today and your tests are completely normal, you will receive your results only by: . MyChart Message (if you have MyChart) OR . A paper copy in the mail If you have any lab test that is abnormal or we need to change your treatment, we will call you to review the results.   Testing/Procedures: None Ordered   Follow-Up: At [facility], you and your health needs are our priority.  As part of our continuing mission to provide you with exceptional heart care, we have created designated Provider Care Teams.  These Care Teams include your primary Cardiologist (physician) and Advanced Practice Providers (APPs -  Physician Assistants and Nurse Practitioners) who all work together to provide you with the care you need, when you need it.  We recommend signing up for the patient portal called "MyChart".  Sign up information is provided on this After Visit Summary.  MyChart is used to connect with patients for Virtual Visits (Telemedicine).  Patients are able to view lab/test results, encounter notes, upcoming appointments, etc.  Non-urgent messages can be sent to your provider as well.   To learn more about what you can do with MyChart, go to [email or web address].    Your next appointment:   6 month(s)  The format for your next appointment:   Either In Person or Virtual  Provider:   You may see Philip Nahser, MD or one of the following Advanced Practice Providers on your designated Care Team:    Scott Weaver, PA-C  Vin Bhagat, PA-C  Janine Hammond, NP     

## 2020-03-03 ENCOUNTER — Telehealth: Payer: Self-pay | Admitting: Cardiovascular Disease

## 2020-03-03 NOTE — Telephone Encounter (Signed)
Jrue is returning Ann's call in regards to lab results.

## 2020-03-03 NOTE — Telephone Encounter (Signed)
I spoke with patient and reviewed lipid, liver and BMP results with him.

## 2020-05-13 ENCOUNTER — Other Ambulatory Visit: Payer: Self-pay | Admitting: Cardiovascular Disease

## 2020-07-14 IMAGING — CR DG RIBS W/ CHEST 3+V*R*
4 series · 4 of 4 positions shown · non-contrast
Comparison: None.

CLINICAL DATA: Pain and bruising of the right anterior lower rib
secondary to a fall this morning in the kitchen.

EXAM:
RIGHT RIBS AND CHEST - 3+ VIEW

[w chest pa]
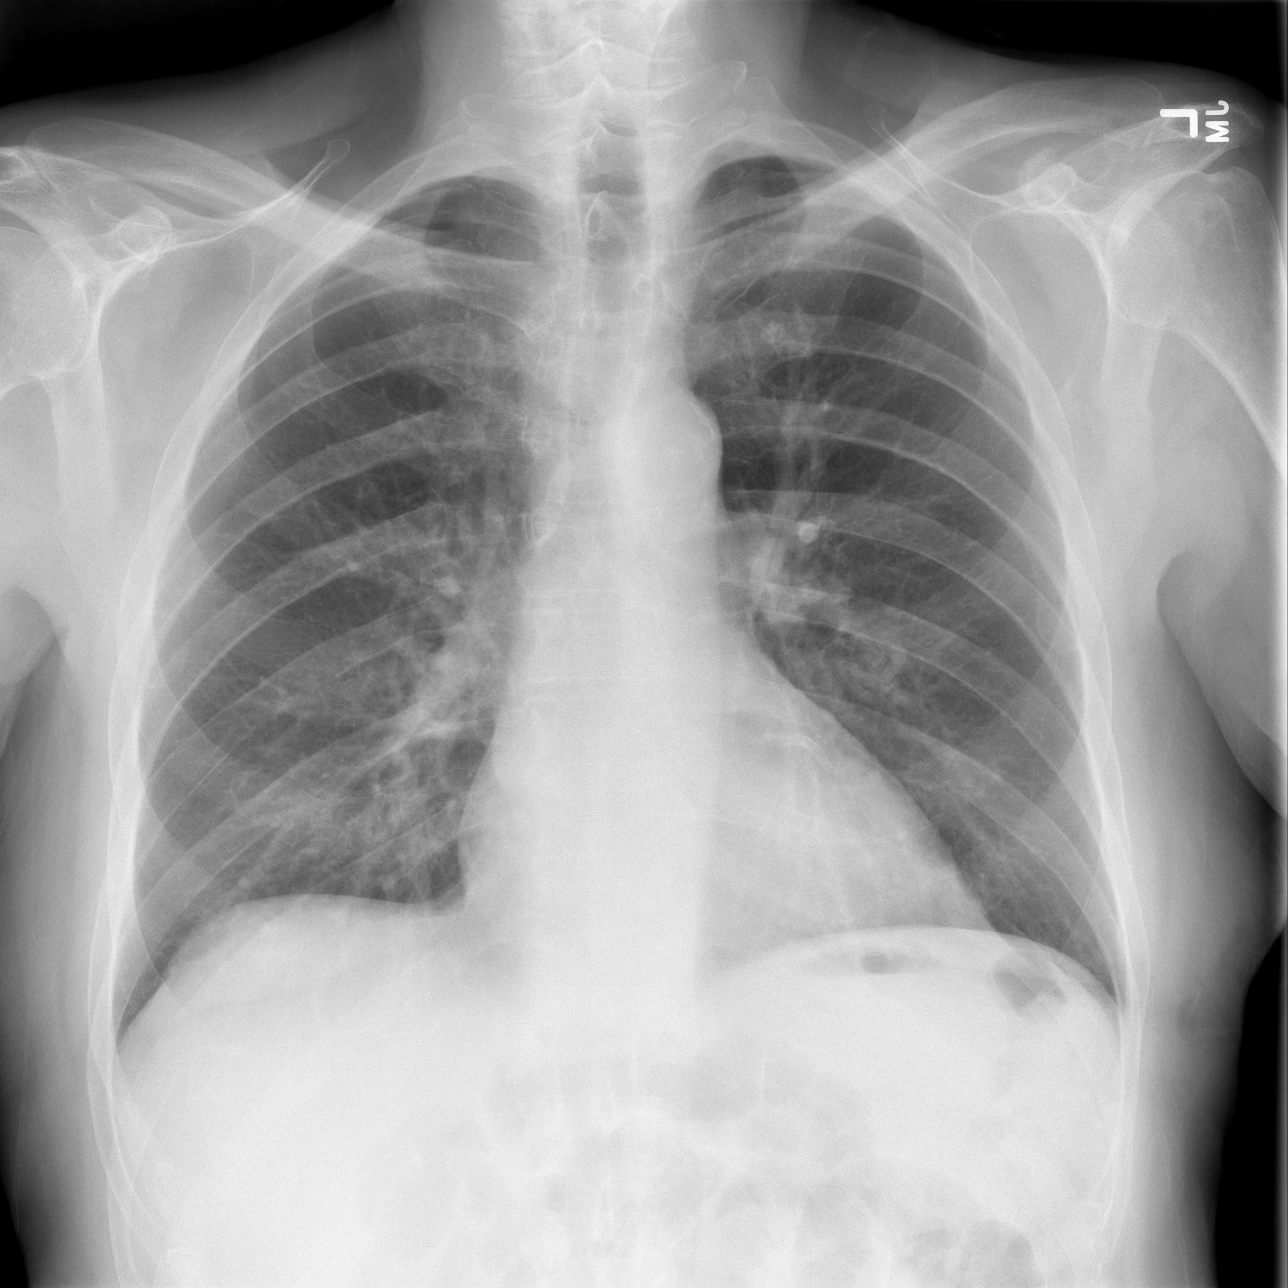

[w ribs ap/pa upper right]
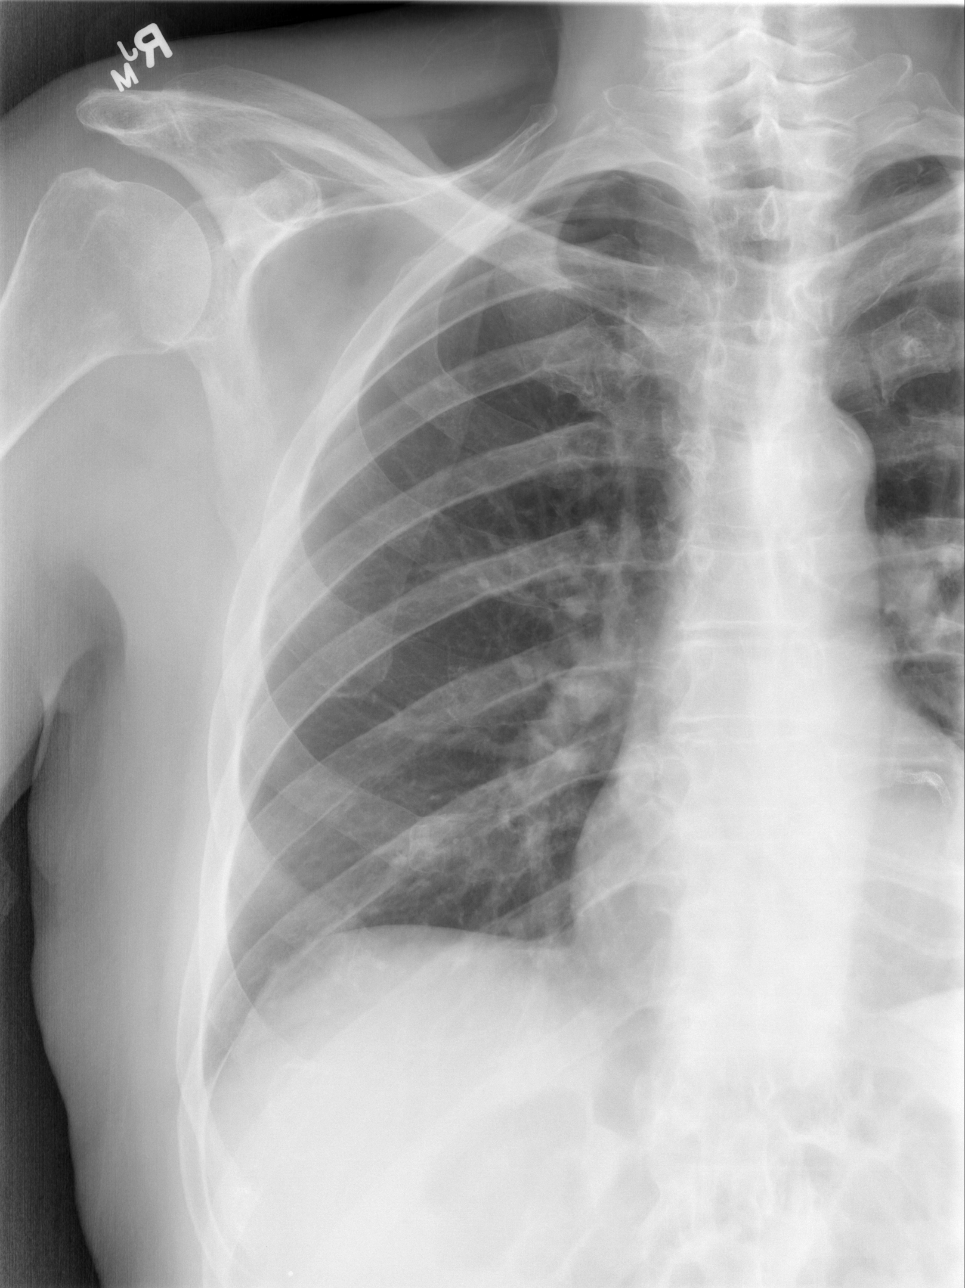

[w ribs ap/pa lower right]
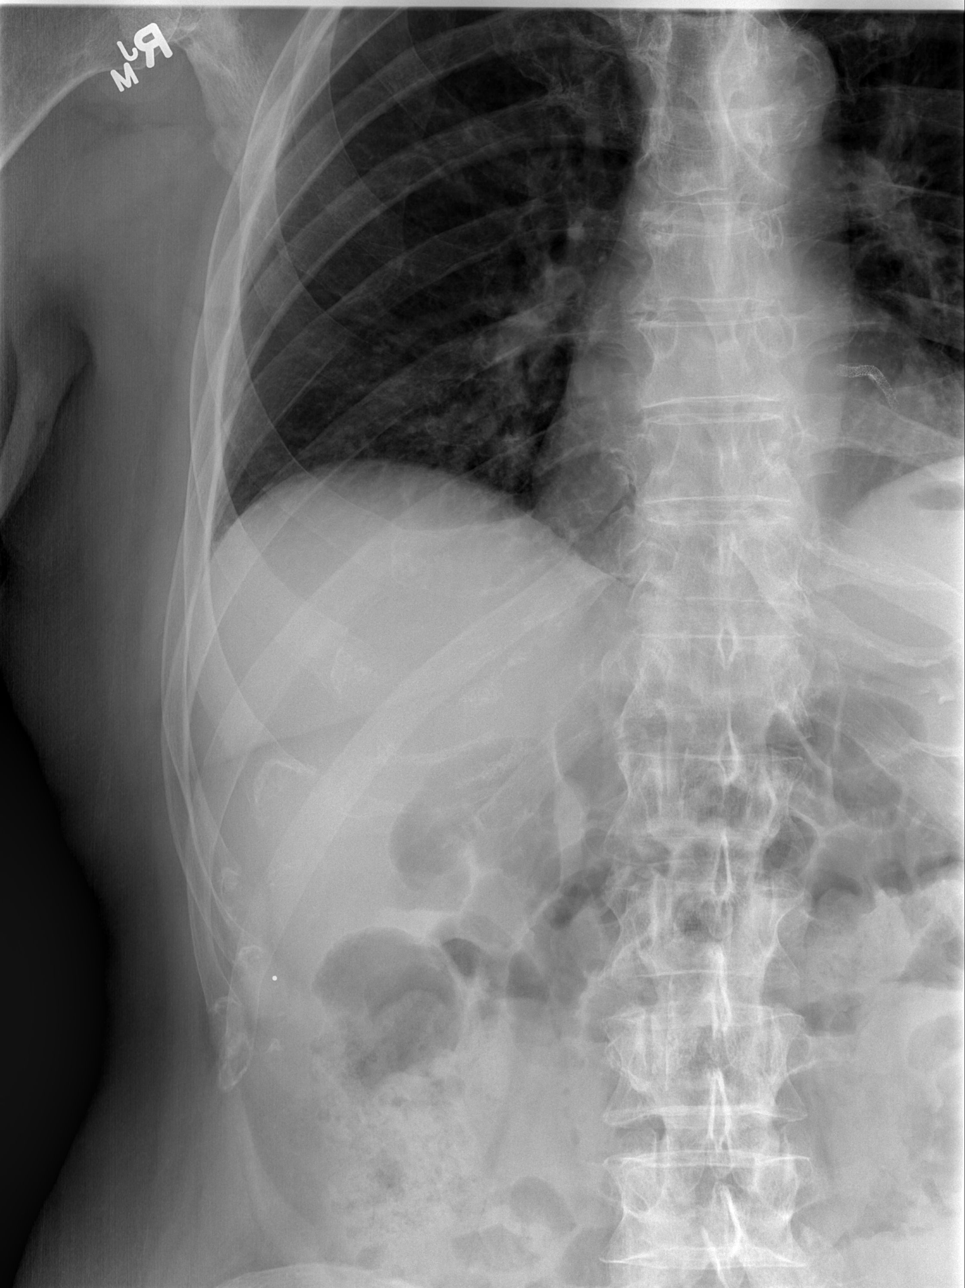

[w ribs oblique right]
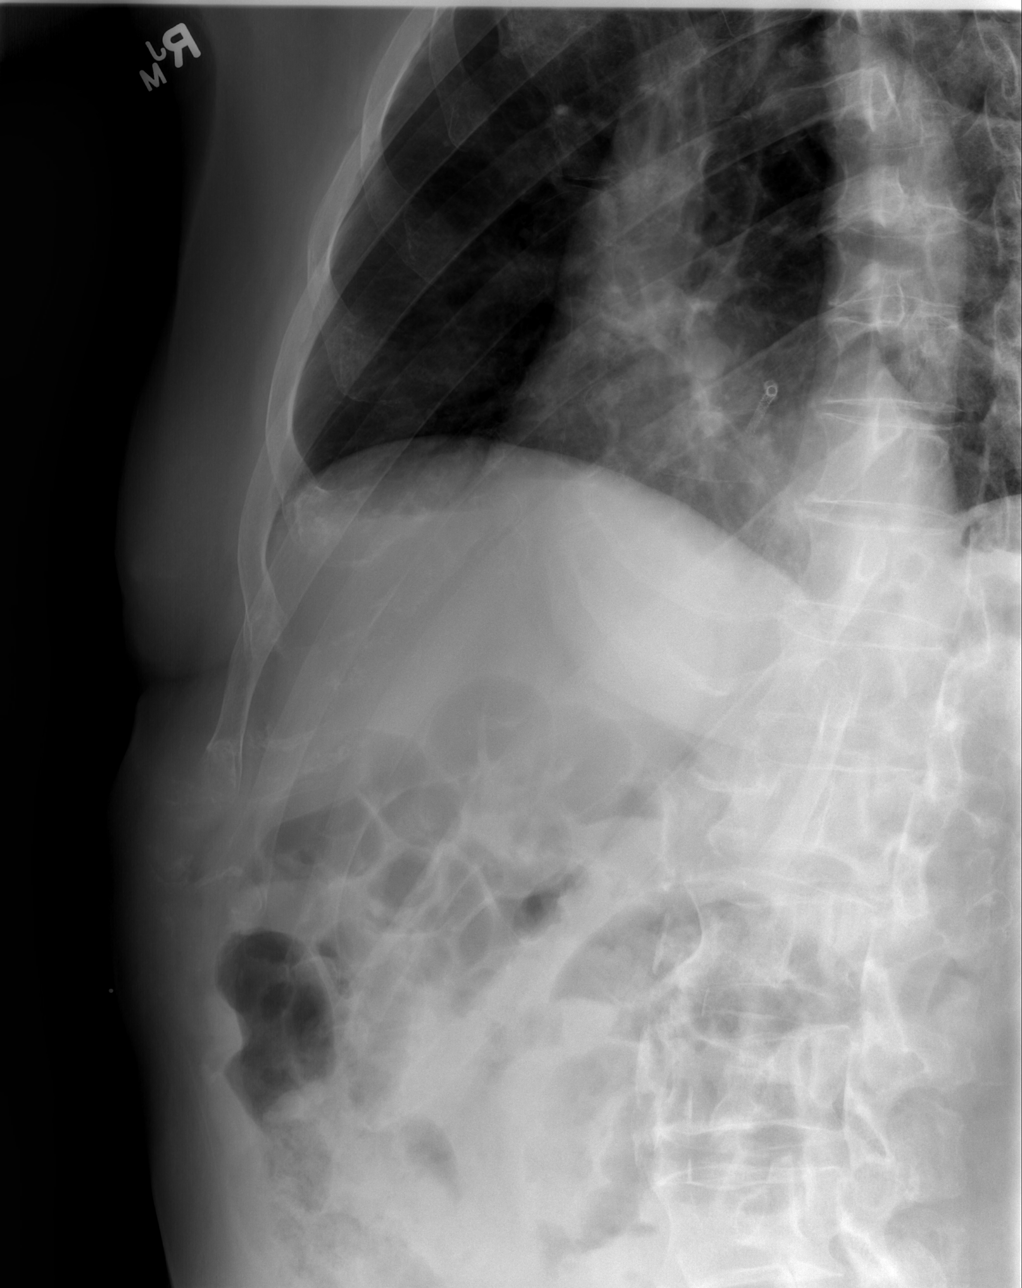

[4 of 4 positions shown; findings below may reference images not displayed]

FINDINGS: No fracture or other bone lesions are seen involving the ribs. There
is no evidence of pneumothorax or pleural effusion. Both lungs are
clear. Heart size and mediastinal contours are within normal limits.
Coronary artery stent in place. Aortic atherosclerosis.
IMPRESSION: No visible rib fractures.

Aortic Atherosclerosis ([id number]).

## 2020-07-19 ENCOUNTER — Other Ambulatory Visit: Payer: Self-pay | Admitting: Cardiovascular Disease

## 2020-09-02 ENCOUNTER — Encounter: Payer: Self-pay | Admitting: Cardiovascular Disease

## 2020-09-02 NOTE — Progress Notes (Signed)
Cardiology Office Note   Date:  09/03/2020   ID:  Anthony Carpenter, DOB [birth year], MRN [id number]  PCP:  Joycelyn Rua, MD  Cardiologist:   Kristeen Miss, MD   Chief Complaint  Patient presents with  . Coronary Artery Disease  . Hyperlipidemia    1. Coronary artery disease-status post PTCA and stenting of his mid LAD.  ( February 11, 2015)    We placed a 2.75 x 20 mm Taxus stent. It was post dilated using a 3.0 Quantum Maverick, 07/28/05  2. Hyperlipidemia-   3. Possible bicuspid aortic valve   Anthony Carpenter is doing well. No chest pain or dyspnea. He plans on putting in a garden this summer . He's building his house this year. He was admitted to the hospital with some chest pain/abdominal pain. A Myoview study did not reveal any ischemia.  He has felt better. No further episodes of chest pain. He forgot to take his meds this am.  Feb. 26, 2014: Anthony Carpenter is doing well. He has not had any angina. He remains active - still building houses.   July 14, 2013:  Anthony Carpenter is doing Well. No angina.   July 14, 2014:  Anthony Carpenter is doing well. Still working No CP, no dyspnea.   Dec. 11, 2015;  Anthony Carpenter was in the hospital for CP recently. He was having vague chest discomfort. Cath showed a 60 % instent restenosis. Pains have resolved. He is back doing all of his normal activities. BP has been lower   February 05, 2015:  Anthony Carpenter is a 79 y.o. male who presents for some CP and DOE for the past month. 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 Will last as long as he's working . Eases up if he stops.   March 17, 2015:  Anthony Carpenter is a 79 y.o. male who presents for follow up of an episode of unstable angina.  I saw him in March for symptoms worisome of UAP.  CAth showed.   Left main: No obstructive disease.   Left Anterior Descending Artery: Large caliber vessel that courses to the apex. The mid segment is stented. The old stent has 99%  restenosis. The distal vessel is free of obstructive disease. The Diagonal branch is moderate in caliber with ostial 30% stenosis where the vessel is jailed by the LAD stent.  Circumflex Artery: Large caliber vessel with moderate caliber obtuse marginal branch. The obtuse marginal branch has proximal 50% stenosis.  Right Coronary Artery: Large dominant vessel with 30% mid stenosis.  Left Ventricular Angiogram: LVEF=45-50% with hypokinesis of the anterior wall and apex.  He had 3.0 x 16 mm Promus Premier DES in the mid LAD. The stent was post-dilated with a 3.25 x 12 mm [place] balloon x 1. The stenosis was taken from 99% down to 0%.   He is feeling much better.   Not shortness of breath   Participating in cardiac rehab. Bruising from the plavix.   But no real PC   Oct. 24, 2016:  Anthony Carpenter is doing well. Walking regularly  Has several projects doing on. Developing some land  Has some bruising and occasional nose bleeds.   March 13, 2016:  Staying active.    Occasional nose bleeds   Oct. 23, 2017:  Doing well .  No CP or dyspnea. Cooked 80 gallons of [place] stew this weekend  Has occasional nose bleed   Mar 28, 2017:  Doing well.   No angina Working on  his housing development   October 04, 2017:  Doing well .   Has lots of bruising but not so much nosebleeding. We have stopped the ASA and continued the plavix.    May 20, 2018: Anthony Carpenter is seen today for follow-up of his coronary artery disease and hyperlipidemia No CP ,  No dyspnea.   Still bruising.   Is still on Plavix,   Is off ASA  No recent nose bleeds  Jan. 6, 2020 Has mild DOE if he exrcises vigorously No return of the angina he had last year  June 02, 2019 :  Has CAD.   Was having angina,  Started Imdur.  Pain significantly improved. Not able to do as much as he thinks he would do .  Walks 1/2 mile without any problems.  Can climb 2 flights of stairs.   Has not needed NTG recently   September 04, 2019:  Anthony Carpenter is  seen today for a follow-up visit.  He has a history of coronary artery disease.  When I last saw him in July he was having some unusual types of chest pains.  He was able to exercise without too much difficulty.  He did not want have a heart catheterization but wanted to be reevaluated in several months and returns today for further evaluation.  Still has some CP if he "rushes" too much   Has some angina - not as bad as his previous CP.  Imdur seems to help   March 02, 2020: Anthony Carpenter is seen today for follow-up of his coronary artery disease.  He has had some unusual type of chest pains in the past but he did not want to have a heart catheterization. Blood pressure and heart rate remain well controlled. Can walk for abut a mile ,   Gets out of breath if he rushes too much .  Has not had to take a NTG .   Oct. 14, 2021: Anthony Carpenter is seen today for follow up of his CAD, hyperlipidemia No CP. 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 Aortic stenosis, mild   . Bicuspid aortic valve   . Bradycardia   . Carotid bruit present    a. Right side  . Coronary artery disease    a. CAD s/p PTCA/Taxus stent to mid-prox LAD 2006  b. cath 10/02/14 with 60% focal instent restenosis in LAD prox to the Diag takeoff, 30% mid AV groove left circ stenosis and luminal irregularities in a large dominant RCA. FFR of LAD was 0.85 and not felt to be HD significant to warrant PCI.  Marland Kitchen Glaucoma   . Hyperlipidemia   . Hypertension   . Myocardial infarction (HCC) 2006  . Pulmonary nodule seen on imaging study    a. seen on CT during 09/2014 admission, may require follow up imaging     Past Surgical History:  Procedure Laterality Date  . CARDIAC CATHETERIZATION  07/28/2005   Est EF of 55% -- Single-vessel obstructive disease in the proximal/mid left anterior  descending -- Preserved left ventricular systolic function with good anterior wall motion  . cataracts  5-6 years ago  . COLONOSCOPY  ? 2009  . CORONARY ANGIOPLASTY WITH STENT PLACEMENT  07/28/2005   Successful PTCA and stenting of the mid and proximal left anterior descending artery.  The patient is stable leaving the catheterization laboratory -- Vesta Mixer, M.D.    . CORONARY STENT PLACEMENT  02/11/2015   3.0 x 16 mm Promus Premier DES to the mLAD for ISR  . LEFT HEART CATHETERIZATION WITH CORONARY ANGIOGRAM N/A 10/02/2014   Procedure: LEFT HEART CATHETERIZATION WITH CORONARY ANGIOGRAM;  Surgeon: Lennette Bihari, MD;  Location: [facility] C[facility] LAB;  Service: Cardiovascular;  Laterality: N/A;  . LEFT HEART CATHETERIZATION WITH CORONARY ANGIOGRAM N/A 02/11/2015   Procedure: LEFT HEART CATHETERIZATION WITH CORONARY ANGIOGRAM;  Surgeon: Kathleene Hazel, MD; LAD 99% ISR, CFX okay, OM1 50%, RCA 30%, D1 30% (jailed by the LAD stent), EF 45-50 %  . THULIUM LASER TURP (TRANSURETHRAL RESECTION OF PROSTATE) N/A 08/06/2018   Procedure: THULIUM LASER TURP (TRANSURETHRAL RESECTION OF PROSTATE);  Surgeon: Jerilee Field, MD;  Location: [facility] ORS;  Service: Urology;  Laterality: N/A;     Current Outpatient Medications  Medication Sig Dispense Refill  . atorvastatin (LIPITOR) 80 MG tablet TAKE 1 TABLET BY MOUTH  DAILY AT 6PM 90 tablet 2  . clopidogrel (PLAVIX) 75 MG tablet TAKE 1 TABLET BY MOUTH  DAILY WITH BREAKFAST 90 tablet 2  . dorzolamide-timolol (COSOPT) 22.3-6.8 MG/ML ophthalmic solution Place 1 drop into both eyes 2 (two) times daily.     . isosorbide mononitrate (IMDUR) 30 MG 24 hr tablet TAKE 1 TABLET BY MOUTH EVERY DAY 90 tablet 1  . latanoprost (XALATAN) 0.005 % ophthalmic solution Place 1 drop into both eyes at bedtime.     Marland Kitchen lisinopril (ZESTRIL) 10 MG tablet TAKE 1 TABLET BY MOUTH  DAILY 90 tablet 2  . Multiple Vitamins-Minerals (ICAPS AREDS FORMULA PO) Take 1 tablet by mouth 2 (two) times  daily.     . nitroGLYCERIN (NITROSTAT) 0.4 MG SL tablet Place 1 tablet (0.4 mg total) under the tongue every 5 (five) minutes as needed. For chest pain. 25 tablet 12  . omega-3 acid ethyl esters (LOVAZA) 1 g capsule Take 1 capsule by mouth 2 (two) times daily.     No current facility-administered medications for this visit.    Allergies:   Patient has no known allergies.    Social History:  The patient  reports that he has never smoked. He has never used smokeless tobacco. He reports that he does not drink alcohol and does not use drugs.   Family History:  The patient's family history includes Cancer in an other family member; Lung cancer in his father.    ROS:   Noted in current history, otherwise review of systems is negative.   Physical Exam: Blood pressure 106/60, pulse (!) 46, height 5\' 10"  (1.778 m), weight 193 lb 6.4 oz (87.7 kg), SpO2 96 %.  GEN:  Well nourished, well developed in no acute distress HEENT: Normal NECK: No JVD; No carotid bruits LYMPHATICS: No lymphadenopathy CARDIAC: RRR ,  Soft systolic murmur  RESPIRATORY:  Clear to auscultation without rales, wheezing or rhonchi  ABDOMEN: Soft, non-tender, non-distended MUSCULOSKELETAL:  No edema; No deformity  SKIN: Warm and dry NEUROLOGIC:  Alert and oriented x 3    EKG:    September 03, 2020: Ectopic atrial bradycardia.  No ST or T wave changes.  Recent Labs: 03/02/2020: ALT 16; BUN 14; Creatinine, Ser 0.84; Potassium 4.1; Sodium 141    Lipid Panel    Component Value Date/Time   CHOL 95 (L) 03/02/2020 0956   TRIG 64 03/02/2020 0956   HDL 33 (L) 03/02/2020 0956   CHOLHDL 2.9 03/02/2020  0956   CHOLHDL 2.8 09/11/2016 0904   VLDL 14 09/11/2016 0904   LDLCALC 48 03/02/2020 0956      Wt Readings from Last 3 Encounters:  09/03/20 193 lb 6.4 oz (87.7 kg)  03/02/20 198 lb 12.8 oz (90.2 kg)  09/04/19 191 lb 12.8 oz (87 kg)      Other studies Reviewed: Additional studies/ records that were reviewed today  include: . Review of the above records demonstrates:    ASSESSMENT AND PLAN:   1. Coronary artery disease-status post PTCA and stenting of his mid LAD. We placed a 2.75 x 20 mm Taxus stent,  Now with a 3.0 x 16 mm Promus Premier DES in the mid LAD.     2. Hyperlipidemia-    we will check lipids, liver enzymes, basic metabolic profile.   3.  Generalized fatigue:  Anthony Carpenter is having some generalized fatigue that seems to be worsening over the past couple months.  In addition, his heart rate is slow.  We will check a TSH for further evaluation of his fatigue and bradycardia.  We will also check a CBC since he is on long-term Plavix.  Current medicines are reviewed at length with the patient today.  The patient does not have concerns regarding medicines.  The following changes have been made:  no change  Labs/ tests ordered today include:   Orders Placed This Encounter  Procedures  . TSH  . Hepatic function panel  . Basic Metabolic Panel (BMET)  . Lipid Profile  . CBC  . EKG 12-Lead    Disposition:   FU with me in 6 months      Signed, Kristeen MissPhilip Danique Hartsough, MD  09/03/2020 9:56 AM    [facility] [address], [place], [place]  [address] Phone: [phone]; Fax: [phone]

## 2020-09-03 ENCOUNTER — Other Ambulatory Visit: Payer: Self-pay

## 2020-09-03 ENCOUNTER — Encounter: Payer: Self-pay | Admitting: Cardiovascular Disease

## 2020-09-03 ENCOUNTER — Ambulatory Visit: Payer: Medicare Other | Admitting: Cardiovascular Disease

## 2020-09-03 VITALS — BP 106/60 | HR 46 | Ht 70.0 in | Wt 193.4 lb

## 2020-09-03 DIAGNOSIS — I10 Essential (primary) hypertension: Secondary | ICD-10-CM

## 2020-09-03 DIAGNOSIS — R5383 Other fatigue: Secondary | ICD-10-CM

## 2020-09-03 DIAGNOSIS — E782 Mixed hyperlipidemia: Secondary | ICD-10-CM

## 2020-09-03 DIAGNOSIS — I2510 Atherosclerotic heart disease of native coronary artery without angina pectoris: Secondary | ICD-10-CM | POA: Diagnosis not present

## 2020-09-03 DIAGNOSIS — I350 Nonrheumatic aortic (valve) stenosis: Secondary | ICD-10-CM

## 2020-09-03 LAB — BASIC METABOLIC PANEL
BUN/Creatinine Ratio: 13 (ref 10–24)
BUN: 13 mg/dL (ref 8–27)
CO2: 23 mmol/L (ref 20–29)
Calcium: 9.2 mg/dL (ref 8.6–10.2)
Chloride: 105 mmol/L (ref 96–106)
Creatinine, Ser: 1.01 mg/dL (ref 0.76–1.27)
GFR calc Af Amer: 81 mL/min/{1.73_m2} (ref >59)
GFR calc non Af Amer: 70 mL/min/{1.73_m2} (ref >59)
Glucose: 92 mg/dL (ref 65–99)
Potassium: 4.1 mmol/L (ref 3.5–5.2)
Sodium: 142 mmol/L (ref 134–144)

## 2020-09-03 LAB — HEPATIC FUNCTION PANEL
ALT: 14 IU/L (ref 0–44)
AST: 17 IU/L (ref 0–40)
Albumin: 4.2 g/dL (ref 3.7–4.7)
Alkaline Phosphatase: 98 IU/L (ref 44–121)
Bilirubin Total: 0.7 mg/dL (ref 0.0–1.2)
Bilirubin, Direct: 0.22 mg/dL (ref 0.00–0.40)
Total Protein: 6.7 g/dL (ref 6.0–8.5)

## 2020-09-03 LAB — CBC
Hematocrit: 40.5 % (ref 37.5–51.0)
Hemoglobin: 13 g/dL (ref 13.0–17.7)
MCH: 29.1 pg (ref 26.6–33.0)
MCHC: 32.1 g/dL (ref 31.5–35.7)
MCV: 91 fL (ref 79–97)
Platelets: 150 10*3/uL (ref 150–450)
RBC: 4.47 x10E6/uL (ref 4.14–5.80)
RDW: 13.3 % (ref 11.6–15.4)
WBC: 7.2 10*3/uL (ref 3.4–10.8)

## 2020-09-03 LAB — LIPID PANEL
Chol/HDL Ratio: 2.7 ratio (ref 0.0–5.0)
Cholesterol, Total: 102 mg/dL (ref 100–199)
HDL: 38 mg/dL — ABNORMAL LOW (ref >39)
LDL Chol Calc (NIH): 51 mg/dL (ref 0–99)
Triglycerides: 59 mg/dL (ref 0–149)
VLDL Cholesterol Cal: 13 mg/dL (ref 5–40)

## 2020-09-03 LAB — TSH: TSH: 1.33 u[IU]/mL (ref 0.450–4.500)

## 2020-09-03 NOTE — Patient Instructions (Signed)
Medication Instructions:  Your physician recommends that you continue on your current medications as directed. Please refer to the Current Medication list given to you today.  *If you need a refill on your cardiac medications before your next appointment, please call your pharmacy*   Lab Work: TODAY - TSH, Liver panel, Cholesterol, CBC, BMET If you have labs (blood work) drawn today and your tests are completely normal, you will receive your results only by: Marland Kitchen MyChart Message (if you have MyChart) OR . A paper copy in the mail If you have any lab test that is abnormal or we need to change your treatment, we will call you to review the results.    Testing/Procedures: None Ordered    Follow-Up: At [facility], you and your health needs are our priority.  As part of our continuing mission to provide you with exceptional heart care, we have created designated Provider Care Teams.  These Care Teams include your primary Cardiologist (physician) and Advanced Practice Providers (APPs -  Physician Assistants and Nurse Practitioners) who all work together to provide you with the care you need, when you need it.  We recommend signing up for the patient portal called "MyChart".  Sign up information is provided on this After Visit Summary.  MyChart is used to connect with patients for Virtual Visits (Telemedicine).  Patients are able to view lab/test results, encounter notes, upcoming appointments, etc.  Non-urgent messages can be sent to your provider as well.   To learn more about what you can do with MyChart, go to [email or web address].    Your next appointment:   6 month(s)  The format for your next appointment:   In Person  Provider:   You may see Kristeen Miss, MD or one of the following Advanced Practice Providers on your designated Care Team:    Tereso Newcomer, PA-C  Vin [place], [place]

## 2020-09-06 ENCOUNTER — Telehealth: Payer: Self-pay

## 2020-09-06 NOTE — Telephone Encounter (Signed)
Left message to call back regarding lab work.

## 2020-09-06 NOTE — Telephone Encounter (Signed)
-----   Message from Vesta Mixer, MD sent at 09/03/2020  5:13 PM EDT ----- Tsh is normal Lipids. Liver enz, bmp are stable Awaiting CBC

## 2020-12-29 ENCOUNTER — Other Ambulatory Visit: Payer: Self-pay | Admitting: Cardiovascular Disease

## 2021-01-22 ENCOUNTER — Other Ambulatory Visit: Payer: Self-pay | Admitting: Cardiovascular Disease

## 2021-03-06 ENCOUNTER — Encounter: Payer: Self-pay | Admitting: Cardiovascular Disease

## 2021-03-06 NOTE — Progress Notes (Signed)
Cardiology Office Note   Date:  03/07/2021   ID:  Anthony Carpenter, DOB 22-Nov-1940, MRN [id number]  PCP:  Anthony Rua, MD  Cardiologist:   Anthony Miss, MD   Chief Complaint  Patient presents with  . Coronary Artery Disease    1. Coronary artery disease-status post PTCA and stenting of his mid LAD.  ( February 11, 2015)    We placed a 2.75 x 20 mm Taxus stent. It was post dilated using a 3.0 Quantum Maverick, 07/28/05  2. Hyperlipidemia-   3. Possible bicuspid aortic valve   Anthony Carpenter is doing well. No chest pain or dyspnea. He plans on putting in a garden this summer . He's building his house this year. He was admitted to the hospital with some chest pain/abdominal pain. A Myoview study did not reveal any ischemia.  He has felt better. No further episodes of chest pain. He forgot to take his meds this am.  Feb. 26, 2014: Anthony Carpenter is doing well. He has not had any angina. He remains active - still building houses.   July 14, 2013:  Anthony Carpenter is doing Well. No angina.   July 14, 2014:  Anthony Carpenter is doing well. Still working No CP, no dyspnea.   Dec. 11, 2015;  Anthony Carpenter was in the hospital for CP recently. He was having vague chest discomfort. Cath showed a 60 % instent restenosis. Pains have resolved. He is back doing all of his normal activities. BP has been lower   February 05, 2015:  Anthony Carpenter is a 80 y.o. male who presents for some CP and DOE for the past month. 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 Will last as long as he's working . Eases up if he stops.   March 17, 2015:  Anthony Carpenter is a 80 y.o. male who presents for follow up of an episode of unstable angina.  I saw him in March for symptoms worisome of UAP.  CAth showed.   Left main: No obstructive disease.   Left Anterior Descending Artery: Large caliber vessel that courses to the apex. The mid segment is stented. The old stent has 99% restenosis. The distal  vessel is free of obstructive disease. The Diagonal branch is moderate in caliber with ostial 30% stenosis where the vessel is jailed by the LAD stent.  Circumflex Artery: Large caliber vessel with moderate caliber obtuse marginal branch. The obtuse marginal branch has proximal 50% stenosis.  Right Coronary Artery: Large dominant vessel with 30% mid stenosis.  Left Ventricular Angiogram: LVEF=45-50% with hypokinesis of the anterior wall and apex.  He had 3.0 x 16 mm Promus Premier DES in the mid LAD. The stent was post-dilated with a 3.25 x 12 mm [place] balloon x 1. The stenosis was taken from 99% down to 0%.   He is feeling much better.   Not shortness of breath   Participating in cardiac rehab. Bruising from the plavix.   But no real PC   Oct. 24, 2016:  Anthony Carpenter is doing well. Walking regularly  Has several projects doing on. Developing some land  Has some bruising and occasional nose bleeds.   March 13, 2016:  Staying active.    Occasional nose bleeds   Oct. 23, 2017:  Doing well .  No CP or dyspnea. Cooked 80 gallons of [place] stew this weekend  Has occasional nose bleed   Mar 28, 2017:  Doing well.   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   Has lots of bruising but not so much nosebleeding. We have stopped the ASA and continued the plavix.    May 20, 2018: Anthony Carpenter is seen today for follow-up of his coronary artery disease and hyperlipidemia No CP ,  No dyspnea.   Still bruising.   Is still on Plavix,   Is off ASA  No recent nose bleeds  Jan. 6, 2020 Has mild DOE if he exrcises vigorously No return of the angina he had last year  June 02, 2019 :  Has CAD.   Was having angina,  Started Imdur.  Pain significantly improved. Not able to do as much as he thinks he would do .  Walks 1/2 mile without any problems.  Can climb 2 flights of stairs.   Has not needed NTG recently   September 04, 2019:  Anthony Carpenter is seen today for a  follow-up visit.  He has a history of coronary artery disease.  When I last saw him in July he was having some unusual types of chest pains.  He was able to exercise without too much difficulty.  He did not want have a heart catheterization but wanted to be reevaluated in several months and returns today for further evaluation.  Still has some CP if he "rushes" too much   Has some angina - not as bad as his previous CP.  Imdur seems to help   March 02, 2020: Anthony Carpenter is seen today for follow-up of his coronary artery disease.  He has had some unusual type of chest pains in the past but he did not want to have a heart catheterization. Blood pressure and heart rate remain well controlled. Can walk for abut a mile ,   Gets out of breath if he rushes too much .  Has not had to take a NTG .   Oct. 14, 2021: Anthony Carpenter is seen today for follow up of his CAD, hyperlipidemia No CP. 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 Wants to try Sildenafil  Will DC his Imdur     Past Medical History:  Diagnosis Date  . Aortic stenosis, mild   . Bicuspid aortic valve   . Bradycardia   . Carotid bruit present    a. Right side  . Coronary artery disease    a. CAD s/p PTCA/Taxus stent to mid-prox LAD 2006  b. cath 10/02/14 with 60% focal instent restenosis in LAD prox to the Diag takeoff, 30% mid AV groove left circ stenosis and luminal irregularities in a large dominant RCA. FFR of LAD was 0.85 and not felt to be HD significant to warrant PCI.  Marland Kitchen Glaucoma   . Hyperlipidemia   . Hypertension   . Myocardial infarction (HCC) 2006  . Pulmonary nodule seen on imaging study    a. seen on CT during 09/2014 admission, may require  follow up imaging     Past Surgical History:  Procedure Laterality Date  . CARDIAC CATHETERIZATION  07/28/2005   Est EF of 55% -- Single-vessel obstructive disease in the proximal/mid left anterior descending -- Preserved left ventricular systolic function  with good anterior wall motion  . cataracts  5-6 years ago  . COLONOSCOPY  ? 2009  . CORONARY ANGIOPLASTY WITH STENT PLACEMENT  07/28/2005   Successful PTCA and stenting of the mid and proximal left anterior descending artery.  The patient is stable leaving the catheterization laboratory -- Vesta Mixer, M.D.    . CORONARY STENT PLACEMENT  02/11/2015   3.0 x 16 mm Promus Premier DES to the mLAD for ISR  . LEFT HEART CATHETERIZATION WITH CORONARY ANGIOGRAM N/A 10/02/2014   Procedure: LEFT HEART CATHETERIZATION WITH CORONARY ANGIOGRAM;  Surgeon: Lennette Bihari, MD;  Location: [facility] CATH LAB;  Service: Cardiovascular;  Laterality: N/A;  . LEFT HEART CATHETERIZATION WITH CORONARY ANGIOGRAM N/A 02/11/2015   Procedure: LEFT HEART CATHETERIZATION WITH CORONARY ANGIOGRAM;  Surgeon: Kathleene Hazel, MD; LAD 99% ISR, CFX okay, OM1 50%, RCA 30%, D1 30% (jailed by the LAD stent), EF 45-50 %  . THULIUM LASER TURP (TRANSURETHRAL RESECTION OF PROSTATE) N/A 08/06/2018   Procedure: THULIUM LASER TURP (TRANSURETHRAL RESECTION OF PROSTATE);  Surgeon: Jerilee Field, MD;  Location: [facility] ORS;  Service: Urology;  Laterality: N/A;     Current Outpatient Medications  Medication Sig Dispense Refill  . atorvastatin (LIPITOR) 80 MG tablet TAKE 1 TABLET BY MOUTH  DAILY AT 6PM 90 tablet 3  . clopidogrel (PLAVIX) 75 MG tablet TAKE 1 TABLET BY MOUTH  DAILY WITH BREAKFAST 90 tablet 2  . dorzolamide-timolol (COSOPT) 22.3-6.8 MG/ML ophthalmic solution Place 1 drop into both eyes 2 (two) times daily.     . isosorbide mononitrate (IMDUR) 30 MG 24 hr tablet TAKE 1 TABLET BY MOUTH  DAILY 90 tablet 2  . latanoprost (XALATAN) 0.005 % ophthalmic solution Place 1 drop  into both eyes at bedtime.     Marland Kitchen lisinopril (ZESTRIL) 10 MG tablet TAKE 1 TABLET BY MOUTH  DAILY 90 tablet 3  . Multiple Vitamins-Minerals (ICAPS AREDS FORMULA PO) Take 1 tablet by mouth 2 (two) times daily.     . nitroGLYCERIN (NITROSTAT) 0.4 MG SL tablet Place 1 tablet (0.4 mg total) under the tongue every 5 (five) minutes as needed. For chest pain. 25 tablet 12   No current facility-administered medications for this visit.    Allergies:   Patient has no known allergies.    Social History:  The patient  reports that he has never smoked. He has never used smokeless tobacco. He reports that he does not drink alcohol and does not use drugs.   Family History:  The patient's family history includes Cancer in an other family member; Lung cancer in his father.    ROS:   Noted in current history, otherwise review of systems is negative.   Physical Exam: Blood pressure 134/68, pulse (!) 48, height 5\' 10"  (1.778 m), weight 196 lb 3.2 oz (89 kg), SpO2 99 %.  GEN:  Well nourished, well developed in no acute distress HEENT: Normal NECK: No JVD; No carotid bruits LYMPHATICS: No lymphadenopathy CARDIAC: RRR , no murmurs, rubs, gallops RESPIRATORY:  Clear to auscultation without rales, wheezing or rhonchi  ABDOMEN: Soft, non-tender, non-distended MUSCULOSKELETAL:  No edema; No deformity  SKIN: Warm and dry NEUROLOGIC:  Alert and oriented x 3  EKG:     Recent Labs: 09/03/2020: ALT 14; BUN 13; Creatinine, Ser 1.01; Hemoglobin 13.0; Platelets 150; Potassium 4.1; Sodium 142; TSH 1.330    Lipid Panel    Component Value Date/Time   CHOL 102 09/03/2020 1004   TRIG 59 09/03/2020 1004  HDL 38 (L) 09/03/2020 1004   CHOLHDL 2.7 09/03/2020 1004   CHOLHDL 2.8 09/11/2016 0904   VLDL 14 09/11/2016 0904   LDLCALC 51 09/03/2020 1004      Wt Readings from Last 3 Encounters:  03/07/21 196 lb 3.2 oz (89 kg)  09/03/20 193 lb 6.4 oz (87.7 kg)  03/02/20 198 lb 12.8 oz (90.2 kg)      Other  studies Reviewed: Additional studies/ records that were reviewed today include: . Review of the above records demonstrates:    ASSESSMENT AND PLAN:   1. Coronary artery disease-status post PTCA and stenting of his mid LAD. We placed a 2.75 x 20 mm Taxus stent,  Now with a 3.0 x 16 mm Promus Premier DES in the mid LAD.   Is not having any angina He would like to try Sildenafil Will DC the Imdur  I've given his the cautions surrounding the use of Sildenafil and nitrates.    2. Hyperlipidemia-    Check lipids, ALT, BMP today    3.  Generalized fatigue:  Likely age related    Current medicines are reviewed at length with the patient today.  The patient does not have concerns regarding medicines.  The following changes have been made:  no change  Labs/ tests ordered today include:   No orders of the defined types were placed in this encounter.   Disposition:   FU with me in 1 year .    Signed, Anthony MissPhilip Eliga Arvie, MD  03/07/2021 8:31 AM    [facility] [address], [place], [place]  [address] Phone: [phone]) [phone]; Fax: [phone]) [phone]

## 2021-03-07 ENCOUNTER — Ambulatory Visit: Payer: Medicare Other | Admitting: Cardiovascular Disease

## 2021-03-07 ENCOUNTER — Encounter: Payer: Self-pay | Admitting: Cardiovascular Disease

## 2021-03-07 ENCOUNTER — Other Ambulatory Visit: Payer: Self-pay

## 2021-03-07 VITALS — BP 134/68 | HR 48 | Ht 70.0 in | Wt 196.2 lb

## 2021-03-07 DIAGNOSIS — I2510 Atherosclerotic heart disease of native coronary artery without angina pectoris: Secondary | ICD-10-CM

## 2021-03-07 DIAGNOSIS — E782 Mixed hyperlipidemia: Secondary | ICD-10-CM | POA: Diagnosis not present

## 2021-03-07 LAB — BASIC METABOLIC PANEL
BUN/Creatinine Ratio: 13 (ref 10–24)
BUN: 14 mg/dL (ref 8–27)
CO2: 22 mmol/L (ref 20–29)
Calcium: 9.1 mg/dL (ref 8.6–10.2)
Chloride: 107 mmol/L — ABNORMAL HIGH (ref 96–106)
Creatinine, Ser: 1.04 mg/dL (ref 0.76–1.27)
Glucose: 97 mg/dL (ref 65–99)
Potassium: 3.9 mmol/L (ref 3.5–5.2)
Sodium: 142 mmol/L (ref 134–144)
eGFR: 73 mL/min/{1.73_m2} (ref >59)

## 2021-03-07 LAB — LIPID PANEL
Chol/HDL Ratio: 2.9 ratio (ref 0.0–5.0)
Cholesterol, Total: 108 mg/dL (ref 100–199)
HDL: 37 mg/dL — ABNORMAL LOW (ref >39)
LDL Chol Calc (NIH): 58 mg/dL (ref 0–99)
Triglycerides: 60 mg/dL (ref 0–149)
VLDL Cholesterol Cal: 13 mg/dL (ref 5–40)

## 2021-03-07 LAB — ALT: ALT: 18 IU/L (ref 0–44)

## 2021-03-07 MED ORDER — SILDENAFIL CITRATE 100 MG PO TABS: 100.0000 mg | ORAL_TABLET | ORAL | 6 refills | Status: DC | PRN

## 2021-03-07 NOTE — Patient Instructions (Signed)
Medication Instructions:  Your physician has recommended you make the following change in your medication:   STOP Imdur START Sildenafil 100mg  as needed *If you need a refill on your cardiac medications before your next appointment, please call your pharmacy*   Lab Work: TODAY: Lipids,alt, bmet If you have labs (blood work) drawn today and your tests are completely normal, you will receive your results only by: MyChart Message (if you have MyChart) OR . A paper copy in the mail If you have any lab test that is abnormal or we need to change your treatment, we will call you to review the results.   Testing/Procedures: none   Follow-Up: At [facility], you and your health needs are our priority.  As part of our continuing mission to provide you with exceptional heart care, we have created designated Provider Care Teams.  These Care Teams include your primary Cardiologist (physician) and Advanced Practice Providers (APPs -  Physician Assistants and Nurse Practitioners) who all work together to provide you with the care you need, when you need it.  We recommend signing up for the patient portal called "MyChart".  Sign up information is provided on this After Visit Summary.  MyChart is used to connect with patients for Virtual Visits (Telemedicine).  Patients are able to view lab/test results, encounter notes, upcoming appointments, etc.  Non-urgent messages can be sent to your provider as well.   To learn more about what you can do with MyChart, go to [facility].    Your next appointment:   1 year(s)  The format for your next appointment:   In Person  Provider:   You may see [email or web address], MD or one of the following Advanced Practice Providers on your designated Care Team:    Kristeen Miss, PA-C  Vin Ballplay, [place]

## 2021-03-31 ENCOUNTER — Other Ambulatory Visit: Payer: Self-pay | Admitting: Cardiovascular Disease

## 2021-09-07 ENCOUNTER — Other Ambulatory Visit: Payer: Self-pay | Admitting: Cardiovascular Disease

## 2021-12-30 ENCOUNTER — Other Ambulatory Visit: Payer: Self-pay | Admitting: Cardiovascular Disease

## 2022-01-05 ENCOUNTER — Encounter: Payer: Self-pay | Admitting: Cardiovascular Disease

## 2022-01-05 ENCOUNTER — Telehealth: Payer: Self-pay

## 2022-01-05 NOTE — Telephone Encounter (Signed)
Left message for patient to call back. Dr. Acie Fredrickson would like patient have an office visit today or tomorrow with him or the DOD.

## 2022-01-05 NOTE — Telephone Encounter (Signed)
Patient called stating he was having chest pain. Transferred the call to triage.

## 2022-01-05 NOTE — Progress Notes (Signed)
Cardiology Office Note   Date:  01/06/2022   ID:  Anthony Carpenter, DOB [birth year], MRN [id number]  PCP:  Joycelyn Rua, MD  Cardiologist:   Kristeen Miss, MD   Chief Complaint  Patient presents with   Coronary Artery Disease    1. Coronary artery disease-status post PTCA and stenting of his mid LAD.  ( February 11, 2015)    We placed a 2.75 x 20 mm Taxus stent. It was post dilated using a 3.0 Quantum Maverick, 07/28/05  2. Hyperlipidemia-    3. Possible bicuspid aortic valve   Anthony Carpenter  is doing well.  No chest pain or dyspnea.  He plans on putting in a garden this summer .  He's building his house this year.  He was admitted to the hospital with some chest pain/abdominal pain. A Myoview study did not reveal any ischemia.  He has felt better.  No further episodes of chest pain.  He forgot to take his meds this am.  Feb. 26, 2014: Anthony Carpenter  is doing well.  He has not had any angina.  He remains active - still building houses.   July 14, 2013:  Anthony Carpenter is doing  Well.  No angina.    July 14, 2014:  Anthony Carpenter is doing well.  Still working   No CP, no dyspnea.    Dec. 11, 2015;  Anthony Carpenter was in the hospital for CP recently.  He was having vague chest discomfort.   Cath showed a 60 % instent restenosis.   Pains have resolved.    He is back doing all of his normal activities.  BP has been lower     February 05, 2015:  Anthony Carpenter is a 81 y.o. male who presents for some CP and DOE for the past month. Occurs when he is working outside.   Mowing, speading fertilizer.   Will last as long as he's working . Eases up if he stops.   March 17, 2015:  Anthony Carpenter is a 81 y.o. male who presents for follow up of an episode of unstable angina.  I saw him in March for symptoms worisome of UAP.  CAth showed.   Left main: No obstructive disease.    Left Anterior Descending Artery: Large caliber vessel that courses to the apex. The mid segment is stented. The old stent has 99% restenosis. The distal  vessel is free of obstructive disease. The Diagonal branch is moderate in caliber with ostial 30% stenosis where the vessel is jailed by the LAD stent.   Circumflex Artery: Large caliber vessel with moderate caliber obtuse marginal branch. The obtuse marginal branch has proximal 50% stenosis.   Right Coronary Artery: Large dominant vessel with 30% mid stenosis.  Left Ventricular Angiogram: LVEF=45-50% with hypokinesis of the anterior wall and apex.  He had 3.0 x 16 mm Promus Premier DES in the mid LAD. The stent was post-dilated with a 3.25 x 12 mm Bryant balloon x 1. The stenosis was taken from 99% down to 0%.   He is feeling much better.   Not shortness of breath   Participating in cardiac rehab. Bruising from the plavix.   But no real PC   Oct. 24, 2016:  Anthony Carpenter is doing well. Walking regularly  Has several projects doing on. Developing some land  Has some bruising and occasional nose bleeds.   March 13, 2016:  Staying active.    Occasional nose bleeds   Oct. 23, 2017:  Doing well .  No CP or dyspnea. Cooked 80 gallons of [place] stew this weekend  Has occasional nose bleed   Mar 28, 2017:  Doing well.   No angina Working on his housing development   October 04, 2017:  Doing well .   Has lots of bruising but not so much nosebleeding. We have stopped the ASA and continued the plavix.    May 20, 2018: Anthony Carpenter is seen today for follow-up of his coronary artery disease and hyperlipidemia No CP ,  No dyspnea.   Still bruising.   Is still on Plavix,   Is off ASA  No recent nose bleeds  Jan. 6, 2020 Has mild DOE if he exrcises vigorously No return of the angina he had last year  June 02, 2019 :  Has CAD.   Was having angina,  Started Imdur.  Pain significantly improved. Not able to do as much as he thinks he would do .  Walks 1/2 mile without any problems.  Can climb 2 flights of stairs.   Has not needed NTG recently   September 04, 2019:  Anthony Carpenter is seen today for a  follow-up visit.  He has a history of coronary artery disease.  When I last saw him in July he was having some unusual types of chest pains.  He was able to exercise without too much difficulty.  He did not want have a heart catheterization but wanted to be reevaluated in several months and returns today for further evaluation.  Still has some CP if he "rushes" too much   Has some angina - not as bad as his previous CP.  Imdur seems to help   March 02, 2020: Anthony Carpenter is seen today for follow-up of his coronary artery disease.  He has had some unusual type of chest pains in the past but he did not want to have a heart catheterization. Blood pressure and heart rate remain well controlled. Can walk for abut a mile ,   Gets out of breath if he rushes too much .  Has not had to take a NTG .   Oct. 14, 2021: Anthony Carpenter is seen today for follow up of his CAD, hyperlipidemia No CP. 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 Wants to try Sildenafil  Will DC his Imdur   Feb. 17, 2023 Anthony Carpenter is seen today for follow up of his CAD He has been having more angina like chest pain recently  Added Imdur - which has helped   Past Medical History:  Diagnosis Date   Aortic stenosis, mild    Bicuspid aortic valve    Bradycardia    Carotid bruit present    a. Right side   Coronary artery disease    a. CAD s/p PTCA/Taxus stent to mid-prox LAD 2006  b.  cath 10/02/14 with 60% focal instent restenosis in LAD prox to the Diag takeoff, 30% mid AV groove left circ stenosis and luminal irregularities in a large dominant RCA. FFR of LAD was 0.85 and not felt to be HD significant to warrant PCI.    Glaucoma    Hyperlipidemia    Hypertension  Myocardial infarction [facility](HCC) 2006   Pulmonary nodule seen on imaging study    a. seen on CT during 09/2014 admission, may require follow up imaging     Past Surgical History:  Procedure Laterality Date   CARDIAC CATHETERIZATION  07/28/2005   Est EF of 55% -- Single-vessel obstructive disease in the proximal/mid left anterior descending -- Preserved left ventricular systolic function with good anterior wall motion   cataracts  5-6 years ago   COLONOSCOPY  ? 2009   CORONARY ANGIOPLASTY WITH STENT PLACEMENT  07/28/2005   Successful PTCA and stenting of the mid and proximal left anterior descending artery.  The patient is stable leaving the catheterization laboratory -- Vesta MixerPhilip J. Bexlee Bergdoll, M.D.     CORONARY STENT PLACEMENT  02/11/2015   3.0 x 16 mm Promus Premier DES to the mLAD for ISR   LEFT HEART CATHETERIZATION WITH CORONARY ANGIOGRAM N/A 10/02/2014   Procedure: LEFT HEART CATHETERIZATION WITH CORONARY ANGIOGRAM;  Surgeon: Lennette Biharihomas A Kelly, MD;  [facility];  Service: Cardiovascular;  Laterality: N/A;   LEFT HEART CATHETERIZATION WITH CORONARY ANGIOGRAM N/A 02/11/2015   Procedure: LEFT HEART CATHETERIZATION WITH CORONARY ANGIOGRAM;  Surgeon: Kathleene Hazelhristopher D McAlhany, MD; LAD 99% ISR, CFX okay, OM1 50%, RCA 30%, D1 30% (jailed by the LAD stent), EF 45-50 %   THULIUM LASER TURP (TRANSURETHRAL RESECTION OF PROSTATE) N/A 08/06/2018   Procedure: THULIUM LASER TURP (TRANSURETHRAL RESECTION OF PROSTATE);  Surgeon: Jerilee FieldEskridge, Matthew, MD;  Location: [facility] ORS;  Service: Urology;  Laterality: N/A;     Current Outpatient Medications  Medication Sig Dispense Refill   aspirin EC 81 MG tablet Take 1 tablet (81 mg total) by mouth daily. Swallow whole. 90 tablet 3   atorvastatin (LIPITOR) 80 MG tablet TAKE 1 TABLET BY MOUTH  DAILY AT 6PM 90 tablet 0   clopidogrel (PLAVIX) 75 MG tablet TAKE 1 TABLET BY MOUTH  DAILY WITH BREAKFAST 90 tablet 3   dorzolamide-timolol (COSOPT) 22.3-6.8 MG/ML ophthalmic  solution Place 1 drop into both eyes 2 (two) times daily.      latanoprost (XALATAN) 0.005 % ophthalmic solution Place 1 drop into both eyes at bedtime.      lisinopril (ZESTRIL) 10 MG tablet TAKE 1 TABLET BY MOUTH  DAILY 90 tablet 0   Multiple Vitamins-Minerals (ICAPS AREDS FORMULA PO) Take 1 tablet by mouth 2 (two) times daily.      nitroGLYCERIN (NITROSTAT) 0.4 MG SL tablet Place 1 tablet (0.4 mg total) under the tongue every 5 (five) minutes as needed. For chest pain. 25 tablet 12   sildenafil (VIAGRA) 100 MG tablet Take 1 tablet (100 mg total) by mouth as needed for erectile dysfunction. (Patient not taking: Reported on 01/06/2022) 10 tablet 6   isosorbide mononitrate (IMDUR) 30 MG 24 hr tablet Take 1 tablet (30 mg total) by mouth daily. 90 tablet 3   No current facility-administered medications for this visit.    Allergies:   Patient has no known allergies.    Social History:  The patient  reports that he has never smoked. He has never used smokeless tobacco. He reports that he does not drink alcohol and does not use drugs.   Family History:  The patient's family history includes Cancer in an other family member; Lung cancer in his father.    ROS:   Noted in current history, otherwise review of systems is negative.  Physical Exam: Blood pressure (!) 108/56, pulse (!) 49, height 5\' 10"  (1.778 m), weight 196 lb 3.2  oz (89 kg), SpO2 98 %.  GEN:  Well nourished, well developed in no acute distress HEENT: Normal NECK: No JVD; No carotid bruits LYMPHATICS: No lymphadenopathy CARDIAC: RRR , no murmurs, rubs, gallops RESPIRATORY:  Clear to auscultation without rales, wheezing or rhonchi  ABDOMEN: Soft, non-tender, non-distended MUSCULOSKELETAL:  No edema; No deformity  SKIN: Warm and dry NEUROLOGIC:  Alert and oriented x 3   EKG:   January 06, 2022: Sinus bradycardia at 49 beats a minute.  Recent Labs: 03/07/2021: ALT 18; BUN 14; Creatinine, Ser 1.04; Potassium 3.9; Sodium 142     Lipid Panel    Component Value Date/Time   CHOL 108 03/07/2021 0837   TRIG 60 03/07/2021 0837   HDL 37 (L) 03/07/2021 0837   CHOLHDL 2.9 03/07/2021 0837   CHOLHDL 2.8 09/11/2016 0904   VLDL 14 09/11/2016 0904   LDLCALC 58 03/07/2021 0837      Wt Readings from Last 3 Encounters:  01/06/22 196 lb 3.2 oz (89 kg)  03/07/21 196 lb 3.2 oz (89 kg)  09/03/20 193 lb 6.4 oz (87.7 kg)      Other studies Reviewed: Additional studies/ records that were reviewed today include: . Review of the above records demonstrates:    ASSESSMENT AND PLAN:   1. Coronary artery disease-status post PTCA and stenting of his mid LAD. We placed a 2.75 x 20 mm Taxus stent,  Now with a 3.0 x 16 mm Promus Premier DES in the mid LAD.    He is having recurrent symptoms of angina.  The symptoms feel exactly like his previous episodes of angina and are occurring with any sort of exertion.  We added isosorbide 30 mg a day yesterday which he says seems to have helped.  I do think that we need to proceed with heart catheterization.  We have discussed the risk, benefits, options of heart catheterization.  He understands and agrees to proceed. He is on Plavix 75 mg a day.  We will add aspirin 81 mg a day until the time of heart catheterization. We will refill his isosorbide.  2. Hyperlipidemia-   continue atorvastatin for now.     3.  Generalized fatigue:       Current medicines are reviewed at length with the patient today.  The patient does not have concerns regarding medicines.  The following changes have been made:  no change  Labs/ tests ordered today include:   Orders Placed This Encounter  Procedures   Basic metabolic panel   CBC     Disposition:   FU with [place] in several weeks     Signed, Kristeen Miss, MD  01/06/2022 4:19 PM    [facility] [address] [place], [place], [place]  [address] Phone: [phone]; Fax: [phone]

## 2022-01-05 NOTE — H&P (View-Only) (Signed)
Cardiology Office Note   Date:  01/06/2022   ID:  [name], DOB [birth year], MRN [id number]  PCP:  Joycelyn Rua, MD  Cardiologist:   Kristeen Miss, MD   Chief Complaint  Patient presents with   Coronary Artery Disease    1. Coronary artery disease-status post PTCA and stenting of his mid LAD.  ( February 11, 2015)    We placed a 2.75 x 20 mm Taxus stent. It was post dilated using a 3.0 Quantum Maverick, 07/28/05  2. Hyperlipidemia-    3. Possible bicuspid aortic valve   Anthony Carpenter  is doing well.  No chest pain or dyspnea.  He plans on putting in a garden this summer .  He's building his house this year.  He was admitted to the hospital with some chest pain/abdominal pain. A Myoview study did not reveal any ischemia.  He has felt better.  No further episodes of chest pain.  He forgot to take his meds this am.  Feb. 26, 2014: Anthony Carpenter  is doing well.  He has not had any angina.  He remains active - still building houses.   July 14, 2013:  Anthony Carpenter is doing  Well.  No angina.    July 14, 2014:  Anthony Carpenter is doing well.  Still working   No CP, no dyspnea.    Dec. 11, 2015;  Anthony Carpenter was in the hospital for CP recently.  He was having vague chest discomfort.   Cath showed a 60 % instent restenosis.   Pains have resolved.    He is back doing all of his normal activities.  BP has been lower     February 05, 2015:  [name] is a 81 y.o. male who presents for some CP and DOE for the past month. Occurs when he is working outside.   Mowing, speading fertilizer.   Will last as long as he's working . Eases up if he stops.   March 17, 2015:  [name] is a 81 y.o. male who presents for follow up of an episode of unstable angina.  I saw him in March for symptoms worisome of UAP.  CAth showed.   Left main: No obstructive disease.    Left Anterior Descending Artery: Large caliber vessel that courses to the apex. The mid segment is stented. The old stent has 99% restenosis. The distal  vessel is free of obstructive disease. The Diagonal branch is moderate in caliber with ostial 30% stenosis where the vessel is jailed by the LAD stent.   Circumflex Artery: Large caliber vessel with moderate caliber obtuse marginal branch. The obtuse marginal branch has proximal 50% stenosis.   Right Coronary Artery: Large dominant vessel with 30% mid stenosis.  Left Ventricular Angiogram: LVEF=45-50% with hypokinesis of the anterior wall and apex.  He had 3.0 x 16 mm Promus Premier DES in the mid LAD. The stent was post-dilated with a 3.25 x 12 mm Bryant balloon x 1. The stenosis was taken from 99% down to 0%.   He is feeling much better.   Not shortness of breath   Participating in cardiac rehab. Bruising from the plavix.   But no real PC   Oct. 24, 2016:  Anthony Carpenter is doing well. Walking regularly  Has several projects doing on. Developing some land  Has some bruising and occasional nose bleeds.   March 13, 2016:  Staying active.    Occasional nose bleeds   Oct. 23, 2017:  Doing well .  No CP or dyspnea. Cooked 80 gallons of [place] stew this weekend  Has occasional nose bleed   Mar 28, 2017:  Doing well.   No angina Working on his housing development   October 04, 2017:  Doing well .   Has lots of bruising but not so much nosebleeding. We have stopped the ASA and continued the plavix.    May 20, 2018: Anthony Carpenter is seen today for follow-up of his coronary artery disease and hyperlipidemia No CP ,  No dyspnea.   Still bruising.   Is still on Plavix,   Is off ASA  No recent nose bleeds  Jan. 6, 2020 Has mild DOE if he exrcises vigorously No return of the angina he had last year  June 02, 2019 :  Has CAD.   Was having angina,  Started Imdur.  Pain significantly improved. Not able to do as much as he thinks he would do .  Walks 1/2 mile without any problems.  Can climb 2 flights of stairs.   Has not needed NTG recently   September 04, 2019:  Anthony Carpenter is seen today for a  follow-up visit.  He has a history of coronary artery disease.  When I last saw him in July he was having some unusual types of chest pains.  He was able to exercise without too much difficulty.  He did not want have a heart catheterization but wanted to be reevaluated in several months and returns today for further evaluation.  Still has some CP if he "rushes" too much   Has some angina - not as bad as his previous CP.  Imdur seems to help   March 02, 2020: Anthony Carpenter is seen today for follow-up of his coronary artery disease.  He has had some unusual type of chest pains in the past but he did not want to have a heart catheterization. Blood pressure and heart rate remain well controlled. Can walk for abut a mile ,   Gets out of breath if he rushes too much .  Has not had to take a NTG .   Oct. 14, 2021: Anthony Carpenter is seen today for follow up of his CAD, hyperlipidemia No CP. 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 Wants to try Sildenafil  Will DC his Imdur   Feb. 17, 2023 Anthony Carpenter is seen today for follow up of his CAD He has been having more angina like chest pain recently  Added Imdur - which has helped   Past Medical History:  Diagnosis Date   Aortic stenosis, mild    Bicuspid aortic valve    Bradycardia    Carotid bruit present    a. Right side   Coronary artery disease    a. CAD s/p PTCA/Taxus stent to mid-prox LAD 2006  b.  cath 10/02/14 with 60% focal instent restenosis in LAD prox to the Diag takeoff, 30% mid AV groove left circ stenosis and luminal irregularities in a large dominant RCA. FFR of LAD was 0.85 and not felt to be HD significant to warrant PCI.    Glaucoma    Hyperlipidemia    Hypertension  Myocardial infarction (HCC) 2006  ° Pulmonary nodule seen on imaging study   ° a. seen on CT during 09/2014 admission, may require follow up imaging   ° ° °Past Surgical History:  °Procedure Laterality Date  ° CARDIAC CATHETERIZATION  07/28/2005  ° Est EF of 55% -- Single-vessel obstructive disease in the proximal/mid left anterior descending -- Preserved left ventricular systolic function with good anterior wall motion  ° cataracts  5-6 years ago  ° COLONOSCOPY  ? 2009  ° CORONARY ANGIOPLASTY WITH STENT PLACEMENT  07/28/2005  ° Successful PTCA and stenting of the mid and proximal left anterior descending artery.  The patient is stable leaving the catheterization laboratory -- Kylie Simmonds J. Kasmira Cacioppo, M.D.    ° CORONARY STENT PLACEMENT  02/11/2015  ° 3.0 x 16 mm Promus Premier DES to the mLAD for ISR  ° LEFT HEART CATHETERIZATION WITH CORONARY ANGIOGRAM N/A 10/02/2014  ° Procedure: LEFT HEART CATHETERIZATION WITH CORONARY ANGIOGRAM;  Surgeon: Thomas A Kelly, MD;  Location: [facility] CATH LAB;  Service: Cardiovascular;  Laterality: N/A;  ° LEFT HEART CATHETERIZATION WITH CORONARY ANGIOGRAM N/A 02/11/2015  ° Procedure: LEFT HEART CATHETERIZATION WITH CORONARY ANGIOGRAM;  Surgeon: Christopher D McAlhany, MD; LAD 99% ISR, CFX okay, OM1 50%, RCA 30%, D1 30% (jailed by the LAD stent), EF 45-50 %  ° THULIUM LASER TURP (TRANSURETHRAL RESECTION OF PROSTATE) N/A 08/06/2018  ° Procedure: THULIUM LASER TURP (TRANSURETHRAL RESECTION OF PROSTATE);  Surgeon: Eskridge, Matthew, MD;  Location: [facility] ORS;  Service: Urology;  Laterality: N/A;  ° ° ° °Current Outpatient Medications  °Medication Sig Dispense Refill  ° aspirin EC 81 MG tablet Take 1 tablet (81 mg total) by mouth daily. Swallow whole. 90 tablet 3  ° atorvastatin (LIPITOR) 80 MG tablet TAKE 1 TABLET BY MOUTH  DAILY AT 6PM 90 tablet 0  ° clopidogrel (PLAVIX) 75 MG tablet TAKE 1 TABLET BY MOUTH  DAILY WITH BREAKFAST 90 tablet 3  ° dorzolamide-timolol (COSOPT) 22.3-6.8 MG/ML ophthalmic  solution Place 1 drop into both eyes 2 (two) times daily.     ° latanoprost (XALATAN) 0.005 % ophthalmic solution Place 1 drop into both eyes at bedtime.     ° lisinopril (ZESTRIL) 10 MG tablet TAKE 1 TABLET BY MOUTH  DAILY 90 tablet 0  ° Multiple Vitamins-Minerals (ICAPS AREDS FORMULA PO) Take 1 tablet by mouth 2 (two) times daily.     ° nitroGLYCERIN (NITROSTAT) 0.4 MG SL tablet Place 1 tablet (0.4 mg total) under the tongue every 5 (five) minutes as needed. For chest pain. 25 tablet 12  ° sildenafil (VIAGRA) 100 MG tablet Take 1 tablet (100 mg total) by mouth as needed for erectile dysfunction. (Patient not taking: Reported on 01/06/2022) 10 tablet 6  ° isosorbide mononitrate (IMDUR) 30 MG 24 hr tablet Take 1 tablet (30 mg total) by mouth daily. 90 tablet 3  ° °No current facility-administered medications for this visit.  ° ° °Allergies:   Patient has no known allergies.  ° ° °Social History:  The patient  reports that he has never smoked. He has never used smokeless tobacco. He reports that he does not drink alcohol and does not use drugs.  ° °Family History:  The patient's family history includes Cancer in an other family member; Lung cancer in his father.  ° ° °ROS:   Noted in current history, otherwise review of systems is negative. ° °Physical Exam: °Blood pressure (!) 108/56, pulse (!) 49, height 5' 10" (1.778 m), weight 196 lb 3.2   oz (89 kg), SpO2 98 %. ° °GEN:  Well nourished, well developed in no acute distress °HEENT: Normal °NECK: No JVD; No carotid bruits °LYMPHATICS: No lymphadenopathy °CARDIAC: RRR , no murmurs, rubs, gallops °RESPIRATORY:  Clear to auscultation without rales, wheezing or rhonchi  °ABDOMEN: Soft, non-tender, non-distended °MUSCULOSKELETAL:  No edema; No deformity  °SKIN: Warm and dry °NEUROLOGIC:  Alert and oriented x 3 ° ° °EKG:   January 06, 2022: Sinus bradycardia at 49 beats a minute. ° °Recent Labs: °03/07/2021: ALT 18; BUN 14; Creatinine, Ser 1.04; Potassium 3.9; Sodium 142   ° ° °Lipid Panel °   °Component Value Date/Time  ° CHOL 108 03/07/2021 0837  ° TRIG 60 03/07/2021 0837  ° HDL 37 (L) 03/07/2021 0837  ° CHOLHDL 2.9 03/07/2021 0837  ° CHOLHDL 2.8 09/11/2016 0904  ° VLDL 14 09/11/2016 0904  ° LDLCALC 58 03/07/2021 0837  ° °  ° °Wt Readings from Last 3 Encounters:  °01/06/22 196 lb 3.2 oz (89 kg)  °03/07/21 196 lb 3.2 oz (89 kg)  °09/03/20 193 lb 6.4 oz (87.7 kg)  °  ° ° °Other studies Reviewed: °Additional studies/ records that were reviewed today include: . °Review of the above records demonstrates:  ° ° °ASSESSMENT AND PLAN: ° ° 1. Coronary artery disease-status post PTCA and stenting of his mid LAD. We placed a 2.75 x 20 mm Taxus stent,  Now with a 3.0 x 16 mm Promus Premier DES in the mid LAD.   ° °He is having recurrent symptoms of angina.  The symptoms feel exactly like his previous episodes of angina and are occurring with any sort of exertion.  We added isosorbide 30 mg a day yesterday which he says seems to have helped.  I do think that we need to proceed with heart catheterization. ° °We have discussed the risk, benefits, options of heart catheterization.  He understands and agrees to proceed. °He is on Plavix 75 mg a day.  We will add aspirin 81 mg a day until the time of heart catheterization. °We will refill his isosorbide. ° °2. Hyperlipidemia-   continue atorvastatin for now. °  °  °3.  Generalized fatigue:  °  ° ° ° °Current medicines are reviewed at length with the patient today.  The patient does not have concerns regarding medicines. ° °The following changes have been made:  no change ° °Labs/ tests ordered today include:  ° °Orders Placed This Encounter  °Procedures  ° Basic metabolic panel  ° CBC  ° ° ° °Disposition:   FU with us in several weeks  ° °  °Signed, °Carrah Eppolito, MD  °01/06/2022 4:19 PM    °[facility] °[address], [place], [place]  [address] °Phone: [phone]; Fax: [phone]  °

## 2022-01-05 NOTE — Telephone Encounter (Signed)
Patient called back. Put patient on Dr. Harvie Bridge schedule at requested.

## 2022-01-05 NOTE — Telephone Encounter (Signed)
Pt c/o of Chest Pain: STAT if CP now or developed within 24 hours  1. Are you having CP right now? no  2. Are you experiencing any other symptoms (ex. SOB, nausea, vomiting, sweating)? no  3. How long have you been experiencing CP? Couple days  4. Is your CP continuous or coming and going? Comes and goes  5. Have you taken Nitroglycerin? Yes, and it helps.  Patient stated that he gets chest pain with minimal activity. Patient stated he was off his  Imdur for 1 month and started back on it yesterday due to chest pain. Patient stated it helps. Made patient a follow up appointment with Dr. Acie Fredrickson at the end of February. Encouraged patient to go to the ED if his chest pain comes back and is not relieved with nitro. Will forward to Dr. Acie Fredrickson to see what he advises for patient until his appointment. ?

## 2022-01-06 ENCOUNTER — Ambulatory Visit: Payer: Medicare Other | Admitting: Cardiovascular Disease

## 2022-01-06 ENCOUNTER — Other Ambulatory Visit: Payer: Self-pay

## 2022-01-06 ENCOUNTER — Encounter: Payer: Self-pay | Admitting: Cardiovascular Disease

## 2022-01-06 VITALS — BP 108/56 | HR 49 | Ht 70.0 in | Wt 196.2 lb

## 2022-01-06 DIAGNOSIS — I200 Unstable angina: Secondary | ICD-10-CM

## 2022-01-06 DIAGNOSIS — I2510 Atherosclerotic heart disease of native coronary artery without angina pectoris: Secondary | ICD-10-CM | POA: Diagnosis not present

## 2022-01-06 MED ORDER — ASPIRIN EC 81 MG PO TBEC: 81.0000 mg | DELAYED_RELEASE_TABLET | Freq: Every day | ORAL | 3 refills | Status: DC

## 2022-01-06 MED ORDER — ISOSORBIDE MONONITRATE ER 30 MG PO TB24: 30.0000 mg | ORAL_TABLET | Freq: Every day | ORAL | 3 refills | Status: DC

## 2022-01-06 NOTE — Patient Instructions (Signed)
Medication Instructions:  Your physician has recommended you make the following change in your medication: 1) START Aspirin 81mg  once daily  *If you need a refill on your cardiac medications before your next appointment, please call your pharmacy*   Lab Work: TODAY: BMET, CBC If you have labs (blood work) drawn today and your tests are completely normal, you will receive your results only by: [place] (if you have MyChart) OR A paper copy in the mail If you have any lab test that is abnormal or we need to change your treatment, we will call you to review the results.   Testing/Procedures: Your physician has requested that you have a cardiac catheterization. Cardiac catheterization is used to diagnose and/or treat various heart conditions. Doctors may recommend this procedure for a number of different reasons. The most common reason is to evaluate chest pain. Chest pain can be a symptom of coronary artery disease (CAD), and cardiac catheterization can show whether plaque is narrowing or blocking your hearts arteries. This procedure is also used to evaluate the valves, as well as measure the blood flow and oxygen levels in different parts of your heart. For further information please visit [email or web address]. Please follow instruction sheet, as given.    Follow-Up: At [facility], you and your health needs are our priority.  As part of our continuing mission to provide you with exceptional heart care, we have created designated Provider Care Teams.  These Care Teams include your primary Cardiologist (physician) and Advanced Practice Providers (APPs -  Physician Assistants and Nurse Practitioners) who all work together to provide you with the care you need, when you need it.  We recommend signing up for the patient portal called "MyChart".  Sign up information is provided on this After Visit Summary.  MyChart is used to connect with patients for Virtual Visits (Telemedicine).  Patients  are able to view lab/test results, encounter notes, upcoming appointments, etc.  Non-urgent messages can be sent to your provider as well.   To learn more about what you can do with MyChart, go to [email or web address].    Your next appointment:   2 month(s)  The format for your next appointment:   In Person  Provider:   Mertie Moores, MD, or PA or NP    Other Instructions  [place] OFFICE [place], [place] [address] Dept: [phone] Loc: [place]  01/06/2022  You are scheduled for a Cardiac Catheterization on Tuesday, February 21 with Dr. Glenetta Hew.  1. Please arrive at the [facility] (Main Entrance A) at [facility]: [address] [place], [place] [address] at 10:30 AM (This time is two hours before your procedure to ensure your preparation). Free valet parking service is available.   Special note: Every effort is made to have your procedure done on time. Please understand that emergencies sometimes delay scheduled procedures.  2. Diet: Do not eat solid foods after midnight.  The patient may have clear liquids until 5am upon the day of the procedure.  3. Labs: You will have labs drawn today.  4. Medication instructions in preparation for your procedure:   Contrast Allergy: No   On the morning of your procedure, take your Aspirin and Plavix/Clopidogrel and any morning medicines NOT listed above.  You may use sips of water.  5. Plan for one night stay--bring personal belongings. 6. Bring a current list of your medications  and current insurance cards. 7. You MUST have a responsible person to drive you home. 8. Someone MUST be with you the first 24 hours after you arrive home or your discharge will be delayed. 9. Please wear clothes that are easy to get on and off and wear slip-on shoes.  Thank you for allowing [place] to care for you!   --  [facility]

## 2022-01-07 LAB — CBC
Hematocrit: 38.8 % (ref 37.5–51.0)
Hemoglobin: 12.9 g/dL — ABNORMAL LOW (ref 13.0–17.7)
MCH: 29.5 pg (ref 26.6–33.0)
MCHC: 33.2 g/dL (ref 31.5–35.7)
MCV: 89 fL (ref 79–97)
Platelets: 140 10*3/uL — ABNORMAL LOW (ref 150–450)
RBC: 4.37 x10E6/uL (ref 4.14–5.80)
RDW: 13.3 % (ref 11.6–15.4)
WBC: 6.9 10*3/uL (ref 3.4–10.8)

## 2022-01-07 LAB — BASIC METABOLIC PANEL
BUN/Creatinine Ratio: 13 (ref 10–24)
BUN: 14 mg/dL (ref 8–27)
CO2: 26 mmol/L (ref 20–29)
Calcium: 9 mg/dL (ref 8.6–10.2)
Chloride: 108 mmol/L — ABNORMAL HIGH (ref 96–106)
Creatinine, Ser: 1.04 mg/dL (ref 0.76–1.27)
Glucose: 101 mg/dL — ABNORMAL HIGH (ref 70–99)
Potassium: 4.2 mmol/L (ref 3.5–5.2)
Sodium: 144 mmol/L (ref 134–144)
eGFR: 72 mL/min/{1.73_m2} (ref >59)

## 2022-01-09 ENCOUNTER — Telehealth: Payer: Self-pay | Admitting: *Deleted

## 2022-01-09 NOTE — Telephone Encounter (Signed)
Cardiac catheterization scheduled at [facility] for: Tuesday January 10, 2022 12:30 PM Arrive [facility] Main Entrance A [facility]) at: 10:30 AM   Diet-no solid food after midnight prior to cath, clear liquids until 5 AM day of procedure.  Medication instructions for procedure: -Usual morning medications can be taken pre-cath with sips of water including aspirin 81 mg and Plavix 75 mg    Must have responsible adult to drive home post procedure and be with patient first 24 hours after arriving home.  [facility] does allow one visitor to wait in the waiting room during the time you are there.   Patient reports does not currently have any new symptoms concerning for COVID-19 and no household members with COVID-19 like illness.    Reviewed procedure instructions with patient.

## 2022-01-09 NOTE — Addendum Note (Signed)
Addended by: Kerrie Buffalo on: 01/09/2022 02:37 PM   Modules accepted: Orders

## 2022-01-09 NOTE — Addendum Note (Signed)
Addended by: Franchot Gallo on: 01/09/2022 08:37 AM   Modules accepted: Orders

## 2022-01-10 ENCOUNTER — Encounter (HOSPITAL_COMMUNITY)
Admission: RE | Disposition: A | Discharge status: Home / Self Care | Payer: Self-pay | Source: Ambulatory Visit | Attending: Cardiology

## 2022-01-10 ENCOUNTER — Ambulatory Visit (HOSPITAL_COMMUNITY)
Admission: RE | Admit: 2022-01-10 | Discharge: 2022-01-10 | Disposition: A | Discharge status: Home / Self Care | Payer: Medicare Other | Source: Ambulatory Visit | Attending: Cardiology | Admitting: Cardiology

## 2022-01-10 ENCOUNTER — Other Ambulatory Visit: Payer: Self-pay

## 2022-01-10 DIAGNOSIS — I25119 Atherosclerotic heart disease of native coronary artery with unspecified angina pectoris: Secondary | ICD-10-CM | POA: Diagnosis not present

## 2022-01-10 DIAGNOSIS — Z79899 Other long term (current) drug therapy: Secondary | ICD-10-CM | POA: Diagnosis not present

## 2022-01-10 DIAGNOSIS — I200 Unstable angina: Secondary | ICD-10-CM

## 2022-01-10 DIAGNOSIS — I2510 Atherosclerotic heart disease of native coronary artery without angina pectoris: Secondary | ICD-10-CM

## 2022-01-10 DIAGNOSIS — Z955 Presence of coronary angioplasty implant and graft: Secondary | ICD-10-CM | POA: Diagnosis not present

## 2022-01-10 DIAGNOSIS — E785 Hyperlipidemia, unspecified: Secondary | ICD-10-CM | POA: Diagnosis not present

## 2022-01-10 DIAGNOSIS — Z7902 Long term (current) use of antithrombotics/antiplatelets: Secondary | ICD-10-CM | POA: Diagnosis not present

## 2022-01-10 DIAGNOSIS — I252 Old myocardial infarction: Secondary | ICD-10-CM | POA: Insufficient documentation

## 2022-01-10 DIAGNOSIS — R5383 Other fatigue: Secondary | ICD-10-CM | POA: Diagnosis not present

## 2022-01-10 DIAGNOSIS — I25110 Atherosclerotic heart disease of native coronary artery with unstable angina pectoris: Secondary | ICD-10-CM | POA: Insufficient documentation

## 2022-01-10 HISTORY — PX: LEFT HEART CATH AND CORONARY ANGIOGRAPHY: CATH118249

## 2022-01-10 SURGERY — LEFT HEART CATH AND CORONARY ANGIOGRAPHY
Anesthesia: LOCAL

## 2022-01-10 MED ORDER — HEPARIN (PORCINE) IN NACL 1000-0.9 UT/500ML-% IV SOLN
INTRAVENOUS | Status: AC
  Filled 2022-01-10: qty 1000

## 2022-01-10 MED ORDER — SODIUM CHLORIDE 0.9 % IV SOLN: 250.0000 mL | INTRAVENOUS | Status: DC | PRN

## 2022-01-10 MED ORDER — LIDOCAINE HCL (PF) 1 % IJ SOLN
INTRAMUSCULAR | Status: DC | PRN
  Administered 2022-01-10: 2 mL

## 2022-01-10 MED ORDER — SODIUM CHLORIDE 0.9 % WEIGHT BASED INFUSION
3.0000 mL/kg/h | INTRAVENOUS | Status: AC
  Administered 2022-01-10: 3 mL/kg/h via INTRAVENOUS

## 2022-01-10 MED ORDER — MIDAZOLAM HCL 2 MG/2ML IJ SOLN
INTRAMUSCULAR | Status: AC
  Filled 2022-01-10: qty 2

## 2022-01-10 MED ORDER — LABETALOL HCL 5 MG/ML IV SOLN: 10.0000 mg | INTRAVENOUS | Status: DC | PRN

## 2022-01-10 MED ORDER — FENTANYL CITRATE (PF) 100 MCG/2ML IJ SOLN
INTRAMUSCULAR | Status: DC | PRN
  Administered 2022-01-10: 25 ug via INTRAVENOUS

## 2022-01-10 MED ORDER — SODIUM CHLORIDE 0.9% FLUSH: 3.0000 mL | Freq: Two times a day (BID) | INTRAVENOUS | Status: DC

## 2022-01-10 MED ORDER — HEPARIN (PORCINE) IN NACL 1000-0.9 UT/500ML-% IV SOLN
INTRAVENOUS | Status: DC | PRN
  Administered 2022-01-10 (×2): 500 mL

## 2022-01-10 MED ORDER — SODIUM CHLORIDE 0.9% FLUSH: 3.0000 mL | INTRAVENOUS | Status: DC | PRN

## 2022-01-10 MED ORDER — SODIUM CHLORIDE 0.9 % IV SOLN: INTRAVENOUS | Status: DC

## 2022-01-10 MED ORDER — ASPIRIN 81 MG PO CHEW: 81.0000 mg | CHEWABLE_TABLET | ORAL | Status: DC

## 2022-01-10 MED ORDER — SODIUM CHLORIDE 0.9 % WEIGHT BASED INFUSION: 1.0000 mL/kg/h | INTRAVENOUS | Status: DC

## 2022-01-10 MED ORDER — VERAPAMIL HCL 2.5 MG/ML IV SOLN
INTRAVENOUS | Status: AC
  Filled 2022-01-10: qty 2

## 2022-01-10 MED ORDER — FENTANYL CITRATE (PF) 100 MCG/2ML IJ SOLN
INTRAMUSCULAR | Status: AC
  Filled 2022-01-10: qty 2

## 2022-01-10 MED ORDER — HEPARIN SODIUM (PORCINE) 1000 UNIT/ML IJ SOLN
INTRAMUSCULAR | Status: AC
  Filled 2022-01-10: qty 10

## 2022-01-10 MED ORDER — HYDRALAZINE HCL 20 MG/ML IJ SOLN: 10.0000 mg | INTRAMUSCULAR | Status: DC | PRN

## 2022-01-10 MED ORDER — ONDANSETRON HCL 4 MG/2ML IJ SOLN: 4.0000 mg | Freq: Four times a day (QID) | INTRAMUSCULAR | Status: DC | PRN

## 2022-01-10 MED ORDER — IOHEXOL 350 MG/ML SOLN
INTRAVENOUS | Status: DC | PRN
Start: 2022-01-10 — End: 2022-01-10
  Administered 2022-01-10: 50 mL

## 2022-01-10 MED ORDER — VERAPAMIL HCL 2.5 MG/ML IV SOLN
INTRAVENOUS | Status: DC | PRN
  Administered 2022-01-10: 10 mL via INTRA_ARTERIAL

## 2022-01-10 MED ORDER — HEPARIN SODIUM (PORCINE) 1000 UNIT/ML IJ SOLN
INTRAMUSCULAR | Status: DC | PRN
Start: 2022-01-10 — End: 2022-01-10
  Administered 2022-01-10: 4500 [IU] via INTRAVENOUS

## 2022-01-10 MED ORDER — MIDAZOLAM HCL 2 MG/2ML IJ SOLN
INTRAMUSCULAR | Status: DC | PRN
  Administered 2022-01-10: 1 mg via INTRAVENOUS

## 2022-01-10 MED ORDER — LIDOCAINE HCL (PF) 1 % IJ SOLN
INTRAMUSCULAR | Status: AC
  Filled 2022-01-10: qty 30

## 2022-01-10 MED ORDER — ACETAMINOPHEN 325 MG PO TABS: 650.0000 mg | ORAL_TABLET | ORAL | Status: DC | PRN

## 2022-01-10 SURGICAL SUPPLY — 10 items
CATH OPTITORQUE TIG 4.0 5F (CATHETERS) ×1 IMPLANT
DEVICE RAD COMP TR BAND LRG (VASCULAR PRODUCTS) ×1 IMPLANT
GLIDESHEATH SLEND SS 6F .021 (SHEATH) ×1 IMPLANT
GUIDEWIRE INQWIRE 1.5J.035X260 (WIRE) IMPLANT
INQWIRE 1.5J .035X260CM (WIRE) ×2
KIT HEART LEFT (KITS) ×3 IMPLANT
PACK CARDIAC CATHETERIZATION (CUSTOM PROCEDURE TRAY) ×3 IMPLANT
SHEATH PROBE COVER 6X72 (BAG) ×1 IMPLANT
TRANSDUCER W/STOPCOCK (MISCELLANEOUS) ×3 IMPLANT
TUBING CIL FLEX 10 FLL-RA (TUBING) ×3 IMPLANT

## 2022-01-10 NOTE — Interval H&P Note (Signed)
History and Physical Interval Note:  01/10/2022 1:44 PM  Anthony Carpenter  has presented today for surgery, with the diagnosis of chest pain-Concern for PROGRESSIVE ANGINA the various methods of treatment have been discussed with the patient and family. After consideration of risks, benefits and other options for treatment, the patient has consented to  Procedure(s): LEFT HEART CATH AND CORONARY ANGIOGRAPHY (N/A)  PERCUTANEOUS CORONARY INTERVENTION  as a surgical intervention.  The patient's history has been reviewed, patient examined, no change in status, stable for surgery.  I have reviewed the patient's chart and labs.  Questions were answered to the patient's satisfaction.    Cath Lab Visit (complete for each Cath Lab visit)  Clinical Evaluation Leading to the Procedure:   ACS: No.  Non-ACS:    Anginal Classification: CCS III  Anti-ischemic medical therapy: Maximal Therapy (2 or more classes of medications)  Non-Invasive Test Results: No non-invasive testing performed  Prior CABG: No previous CABG     Bryan Lemma

## 2022-01-11 ENCOUNTER — Encounter (HOSPITAL_COMMUNITY): Payer: Self-pay | Admitting: Cardiology

## 2022-01-17 ENCOUNTER — Ambulatory Visit: Payer: Medicare Other | Admitting: Cardiovascular Disease

## 2022-03-03 ENCOUNTER — Other Ambulatory Visit: Payer: Self-pay | Admitting: Cardiovascular Disease

## 2022-03-03 NOTE — Progress Notes (Signed)
?Cardiology Office Note:   ? ?Date:  03/06/2022  ? ?ID:  Anthony Carpenter, DOB 1941-04-27, MRN [id number] ? ?PCP:  Joycelyn Rua, MD ?  ?[facility] Providers ?Cardiologist:  Kristeen Miss, MD    ? ?Referring MD: Joycelyn Rua, MD  ? ?Chief Complaint: follow-up CAD s/p recent cardiac catheterization ? ?History of Present Illness:   ? ?Anthony Carpenter is a 81 y.o. male with a hx of CAD s/p MI, s/p PTCA and stenting of mid  and proximal LAD (07/2005), DES to mLAD for 99%  in-stent restenosis, OM1 50%, RCA 30%, D1 30% (jailed by LAD stent) (01/2015), TURP (2019), hyperlipidemia, hypertension, bradycardia, and mild aortic stenosis by echo 2018.  ? ?He has maintained consistent follow-up and was last seen in our office on 01/06/2022 by Dr. Elease Hashimoto. He reported more chest pain concerning for angina.  Isosorbide helped, however Dr. Elease Hashimoto felt that since symptoms present with minor exertion it was best to proceed to cardiac catheterization. LHC 01/10/22 revealed widely patent overlapping stents to proximal LAD and mid LAD, proximal circumflex to mid circumflex lesion 50%, first diagonal jailed lesion 30%, normal LVEF 50 to 55%, normal LVEDP, no aortic stenosis.  ? ?Today, he is here alone for follow-up of CAD with angina. He had 2 episodes of chest pain prior to cardiac catheterization 01/10/22 relieved by nitroglycerin. Cath did not reveal any obstructive coronary disease and plan was to continue medical management.  No recurrent angina since that time. No associated diaphoresis, SOB, nausea. May be related to stress, notes that he gets more anxious now and feels less patient with people recently. He remains active with building houses, housework and yard work. Notes lightheadedness that occurs when in certain positions, such as looking under a sink plumbing or looking up for prolonged time. Feels fatigued but is able to complete activities, just feels that he moves at a slower pace. He denies chest pain, shortness of breath,  lower extremity edema, palpitations, melena, hematuria, diaphoresis, weakness, presyncope, syncope, orthopnea, and PND. ? ? ?Past Medical History:  ?Diagnosis Date  ? Aortic stenosis, mild   ? Bicuspid aortic valve   ? Bradycardia   ? Carotid bruit present   ? a. Right side  ? Coronary artery disease   ? a. CAD s/p PTCA/Taxus stent to mid-prox LAD 2006  b.  cath 10/02/14 with 60% focal instent restenosis in LAD prox to the Diag takeoff, 30% mid AV groove left circ stenosis and luminal irregularities in a large dominant RCA. FFR of LAD was 0.85 and not felt to be HD significant to warrant PCI.   ? Glaucoma   ? Hyperlipidemia   ? Hypertension   ? Myocardial infarction [facility]) 2006  ? Pulmonary nodule seen on imaging study   ? a. seen on CT during 09/2014 admission, may require follow up imaging   ? ? ?Past Surgical History:  ?Procedure Laterality Date  ? CARDIAC CATHETERIZATION  07/28/2005  ? Est EF of 55% -- Single-vessel obstructive disease in the proximal/mid left anterior descending -- Preserved left ventricular systolic function with good anterior wall motion  ? cataracts  5-6 years ago  ? COLONOSCOPY  ? 2009  ? CORONARY ANGIOPLASTY WITH STENT PLACEMENT  07/28/2005  ? Successful PTCA and stenting of the mid and proximal left anterior descending artery.  The patient is stable leaving the catheterization laboratory -- Vesta Mixer, M.D.    ? CORONARY STENT PLACEMENT  02/11/2015  ? 3.0 x 16 mm [employer]  DES to the mLAD for ISR  ? LEFT HEART CATH AND CORONARY ANGIOGRAPHY N/A 01/10/2022  ? Procedure: LEFT HEART CATH AND CORONARY ANGIOGRAPHY;  Surgeon: Marykay Lex, MD;  Location: [facility] INVASIVE CV LAB;  Service: Cardiovascular;  Laterality: N/A;  ? LEFT HEART CATHETERIZATION WITH CORONARY ANGIOGRAM N/A 10/02/2014  ? Procedure: LEFT HEART CATHETERIZATION WITH CORONARY ANGIOGRAM;  Surgeon: Lennette Bihari, MD;  Location: [facility] CATH LAB;  Service: Cardiovascular;  Laterality: N/A;  ? LEFT HEART CATHETERIZATION WITH  CORONARY ANGIOGRAM N/A 02/11/2015  ? Procedure: LEFT HEART CATHETERIZATION WITH CORONARY ANGIOGRAM;  Surgeon: Kathleene Hazel, MD; LAD 99% ISR, CFX okay, OM1 50%, RCA 30%, D1 30% (jailed by the LAD stent), EF 45-50 %  ? THULIUM LASER TURP (TRANSURETHRAL RESECTION OF PROSTATE) N/A 08/06/2018  ? Procedure: THULIUM LASER TURP (TRANSURETHRAL RESECTION OF PROSTATE);  Surgeon: Jerilee Field, MD;  Location: [facility] ORS;  Service: Urology;  Laterality: N/A;  ? ? ?Current Medications: ?Current Meds  ?Medication Sig  ? aspirin EC 81 MG tablet Take 1 tablet (81 mg total) by mouth daily. Swallow whole.  ? atorvastatin (LIPITOR) 80 MG tablet TAKE 1 TABLET BY MOUTH  DAILY AT 6PM  ? clopidogrel (PLAVIX) 75 MG tablet TAKE 1 TABLET BY MOUTH  DAILY WITH BREAKFAST  ? dorzolamide-timolol (COSOPT) 22.3-6.8 MG/ML ophthalmic solution Place 1 drop into both eyes 2 (two) times daily.   ? isosorbide mononitrate (IMDUR) 30 MG 24 hr tablet Take 1 tablet (30 mg total) by mouth daily.  ? latanoprost (XALATAN) 0.005 % ophthalmic solution Place 1 drop into both eyes at bedtime.   ? lisinopril (ZESTRIL) 5 MG tablet Take 1 tablet (5 mg total) by mouth daily.  ? Multiple Vitamins-Minerals (ICAPS AREDS FORMULA PO) Take 1 tablet by mouth 2 (two) times daily.   ? [DISCONTINUED] lisinopril (ZESTRIL) 10 MG tablet TAKE 1 TABLET BY MOUTH  DAILY  ? [DISCONTINUED] nitroGLYCERIN (NITROSTAT) 0.4 MG SL tablet Place 1 tablet (0.4 mg total) under the tongue every 5 (five) minutes as needed. For chest pain.  ?  ? ?Allergies:   Patient has no known allergies.  ? ?Social History  ? ?Socioeconomic History  ? Marital status: Married  ?  Spouse name: Not on file  ? Number of children: Not on file  ? Years of education: Not on file  ? Highest education level: Not on file  ?Occupational History  ? Not on file  ?Tobacco Use  ? Smoking status: Never  ? Smokeless tobacco: Never  ?Vaping Use  ? Vaping Use: Never used  ?Substance and Sexual Activity  ? Alcohol use: No  ?  Drug use: No  ? Sexual activity: Not Currently  ?Other Topics Concern  ? Not on file  ?Social History Narrative  ? Not on file  ? ?Social Determinants of Health  ? ?Financial Resource Strain: Not on file  ?Food Insecurity: Not on file  ?Transportation Needs: Not on file  ?Physical Activity: Not on file  ?Stress: Not on file  ?Social Connections: Not on file  ?  ? ?Family History: ?The patient's family history includes Cancer in an other family member; Lung cancer in his father. There is no history of Heart disease. ? ?ROS:   ?Please see the history of present illness.  ?+ fatigue ?All other systems reviewed and are negative. ? ?Labs/Other Studies Reviewed:   ? ?The following studies were reviewed today: ? ?LHC 01/10/22 ? ?  Previously placed Prox LAD to Mid LAD stent (overlapping drug-eluting  stents) are widely patent. ?  Prox Cx to Mid Cx lesion is 50% stenosed. ?  1st Diag (jailed) lesion is 30% stenosed. ?  The left ventricular systolic function is normal.  The left ventricular ejection fraction is 50-55% by visual estimate. ?  LV end diastolic pressure is normal. ?  There is no aortic valve stenosis.  Mild calcification. ?  ?SUMMARY ?Widely patent overlapping stents in the proximal to mid LAD.  Minimal RCA disease with most prominent lesion being roughly 50% focal mid LCx lesion.  No flow-limiting lesion noted. ?Consider nonmacrovascular disease related chest pain. ?Preserved LVEF with normal EDP. ?  ?  ?RECOMMENDATIONS ?Consider noncardiac source for chest pain. ?Continue aggressive risk factor modification. ? ?Echo 09/2017 ? ? ?Left ventricle:  The cavity size was normal. Systolic function was  ?normal. The estimated ejection fraction was in the range of 55% to  ?60%. Wall motion was normal; there were no regional wall motion  ?abnormalities. The transmitral flow pattern was normal. The  ?deceleration time of the early transmitral flow velocity was  ?normal. The pulmonary vein flow pattern was normal. The  tissue  ?Doppler parameters were normal. Left ventricular diastolic function  ?parameters were normal.  ?Aortic valve:   Trileaflet; moderately thickened, moderately  ?calcified leaflets. Mobility was not restr

## 2022-03-06 ENCOUNTER — Ambulatory Visit: Payer: Medicare Other | Admitting: Nurse Practitioner

## 2022-03-06 ENCOUNTER — Telehealth: Payer: Self-pay | Admitting: *Deleted

## 2022-03-06 ENCOUNTER — Encounter: Payer: Self-pay | Admitting: Nurse Practitioner

## 2022-03-06 VITALS — BP 136/60 | HR 48 | Ht 70.0 in | Wt 191.4 lb

## 2022-03-06 DIAGNOSIS — I2510 Atherosclerotic heart disease of native coronary artery without angina pectoris: Secondary | ICD-10-CM | POA: Diagnosis not present

## 2022-03-06 DIAGNOSIS — E785 Hyperlipidemia, unspecified: Secondary | ICD-10-CM | POA: Diagnosis not present

## 2022-03-06 DIAGNOSIS — R5383 Other fatigue: Secondary | ICD-10-CM

## 2022-03-06 DIAGNOSIS — I10 Essential (primary) hypertension: Secondary | ICD-10-CM | POA: Diagnosis not present

## 2022-03-06 DIAGNOSIS — R001 Bradycardia, unspecified: Secondary | ICD-10-CM

## 2022-03-06 LAB — COMPREHENSIVE METABOLIC PANEL
ALT: 17 IU/L (ref 0–44)
AST: 18 IU/L (ref 0–40)
Albumin/Globulin Ratio: 2 (ref 1.2–2.2)
Albumin: 4.1 g/dL (ref 3.6–4.6)
Alkaline Phosphatase: 107 IU/L (ref 44–121)
BUN/Creatinine Ratio: 14 (ref 10–24)
BUN: 15 mg/dL (ref 8–27)
Bilirubin Total: 0.6 mg/dL (ref 0.0–1.2)
CO2: 22 mmol/L (ref 20–29)
Calcium: 9.1 mg/dL (ref 8.6–10.2)
Chloride: 108 mmol/L — ABNORMAL HIGH (ref 96–106)
Creatinine, Ser: 1.04 mg/dL (ref 0.76–1.27)
Globulin, Total: 2.1 g/dL (ref 1.5–4.5)
Glucose: 94 mg/dL (ref 70–99)
Potassium: 4.3 mmol/L (ref 3.5–5.2)
Sodium: 142 mmol/L (ref 134–144)
Total Protein: 6.2 g/dL (ref 6.0–8.5)
eGFR: 72 mL/min/{1.73_m2} (ref >59)

## 2022-03-06 LAB — CBC
Hematocrit: 42.3 % (ref 37.5–51.0)
Hemoglobin: 13.7 g/dL (ref 13.0–17.7)
MCH: 28.7 pg (ref 26.6–33.0)
MCHC: 32.4 g/dL (ref 31.5–35.7)
MCV: 89 fL (ref 79–97)
Platelets: 145 10*3/uL — ABNORMAL LOW (ref 150–450)
RBC: 4.78 x10E6/uL (ref 4.14–5.80)
RDW: 13.2 % (ref 11.6–15.4)
WBC: 6.1 10*3/uL (ref 3.4–10.8)

## 2022-03-06 LAB — LIPID PANEL
Chol/HDL Ratio: 2.6 ratio (ref 0.0–5.0)
Cholesterol, Total: 95 mg/dL — ABNORMAL LOW (ref 100–199)
HDL: 36 mg/dL — ABNORMAL LOW (ref >39)
LDL Chol Calc (NIH): 46 mg/dL (ref 0–99)
Triglycerides: 54 mg/dL (ref 0–149)
VLDL Cholesterol Cal: 13 mg/dL (ref 5–40)

## 2022-03-06 LAB — TSH: TSH: 1.4 u[IU]/mL (ref 0.450–4.500)

## 2022-03-06 MED ORDER — LISINOPRIL 5 MG PO TABS: 5.0000 mg | ORAL_TABLET | Freq: Every day | ORAL | 2 refills | Status: DC

## 2022-03-06 MED ORDER — NITROGLYCERIN 0.4 MG SL SUBL: 0.4000 mg | SUBLINGUAL_TABLET | SUBLINGUAL | 12 refills | Status: AC | PRN

## 2022-03-06 NOTE — Telephone Encounter (Signed)
Call placed to pt regarding message below, pt has been made aware to stop the Aspirin and to continue the Plavix. ?Pt verbalized understanding.  ?

## 2022-03-06 NOTE — Patient Instructions (Addendum)
Medication Instructions:  ?Your physician has recommended you make the following change in your medication:  ? DECREASE the Lisinopril to 5 mg taking 1 daily.  There is a new prescription for you at [facility] pharmacy [facility] ? ?*If you need a refill on your cardiac medications before your next appointment, please call your pharmacy* ? ? ?Lab Work: ?TODAY:   TSH, CBC, CMET, & LIPID ?If you have labs (blood work) drawn today and your tests are completely normal, you will receive your results only by: ?MyChart Message (if you have MyChart) OR ?A paper copy in the mail ?If you have any lab test that is abnormal or we need to change your treatment, we will call you to review the results. ? ? ?Testing/Procedures: ?None ordered ? ? ?Follow-Up: ?At [facility], you and your health needs are our priority.  As part of our continuing mission to provide you with exceptional heart care, we have created designated Provider Care Teams.  These Care Teams include your primary Cardiologist (physician) and Advanced Practice Providers (APPs -  Physician Assistants and Nurse Practitioners) who all work together to provide you with the care you need, when you need it. ? ?We recommend signing up for the patient portal called "MyChart".  Sign up information is provided on this After Visit Summary.  MyChart is used to connect with patients for Virtual Visits (Telemedicine).  Patients are able to view lab/test results, encounter notes, upcoming appointments, etc.  Non-urgent messages can be sent to your provider as well.   ?To learn more about what you can do with MyChart, go to [email or web address].   ? ?Your next appointment:   ?6 month(s) ? ?The format for your next appointment:   ?In Person ? ?Provider:   ?Kristeen Miss, MD  or Eligha Bridegroom, NP       ? ? ?Other Instructions ? ? ?Important Information About Sugar ? ? ? ? ?  ?

## 2022-03-06 NOTE — Telephone Encounter (Signed)
-----   Message from Emmaline Life, NP sent at 03/06/2022  1:13 PM EDT ----- ?Will you please call him and tell him per Dr. Acie Fredrickson he may discontinue aspirin and continue Plavix alone. Aspirin was started before his heart cath but is no longer required.  ? ?

## 2022-03-06 NOTE — Addendum Note (Signed)
Addended by: Levi Aland on: 03/06/2022 01:14 PM ? ? Modules accepted: Orders ? ?

## 2022-03-20 ENCOUNTER — Other Ambulatory Visit: Payer: Self-pay | Admitting: Cardiovascular Disease

## 2022-04-19 ENCOUNTER — Other Ambulatory Visit: Payer: Self-pay | Admitting: Nurse Practitioner

## 2022-09-03 NOTE — Progress Notes (Unsigned)
Cardiology Office Note   Date:  09/04/2022   ID:  Anthony Carpenter, DOB 1941/11/08, MRN [id number]  PCP:  Joycelyn Rua, MD  Cardiologist:   Kristeen Miss, MD   Chief Complaint  Patient presents with   Coronary Artery Disease   Hyperlipidemia    1. Coronary artery disease-status post PTCA and stenting of his mid LAD.  ( February 11, 2015)    We placed a 2.75 x 20 mm Taxus stent. It was post dilated using a 3.0 Quantum Maverick, 07/28/05  2. Hyperlipidemia-    3. Possible bicuspid aortic valve   Anthony Carpenter  is doing well.  No chest pain or dyspnea.  He plans on putting in a garden this summer .  He's building his house this year.  He was admitted to the hospital with some chest pain/abdominal pain. A Myoview study did not reveal any ischemia.  He has felt better.  No further episodes of chest pain.  He forgot to take his meds this am.  Feb. 26, 2014: Anthony Carpenter  is doing well.  He has not had any angina.  He remains active - still building houses.   July 14, 2013:  Anthony Carpenter is doing  Well.  No angina.    July 14, 2014:  Anthony Carpenter is doing well.  Still working   No CP, no dyspnea.    Dec. 11, 2015;  Anthony Carpenter was in the hospital for CP recently.  He was having vague chest discomfort.   Cath showed a 60 % instent restenosis.   Pains have resolved.    He is back doing all of his normal activities.  BP has been lower     February 05, 2015:  Anthony Carpenter is a 81 y.o. male who presents for some CP and DOE for the past month. Occurs when he is working outside.   Mowing, speading fertilizer.   Will last as long as he's working . Eases up if he stops.   March 17, 2015:  Anthony Carpenter is a 81 y.o. male who presents for follow up of an episode of unstable angina.  I saw him in March for symptoms worisome of UAP.  CAth showed.   Left main: No obstructive disease.    Left Anterior Descending Artery: Large caliber vessel that courses to the apex. The mid segment is stented. The Carpenter stent has 99%  restenosis. The distal vessel is free of obstructive disease. The Diagonal branch is moderate in caliber with ostial 30% stenosis where the vessel is jailed by the LAD stent.   Circumflex Artery: Large caliber vessel with moderate caliber obtuse marginal branch. The obtuse marginal branch has proximal 50% stenosis.   Right Coronary Artery: Large dominant vessel with 30% mid stenosis.  Left Ventricular Angiogram: LVEF=45-50% with hypokinesis of the anterior wall and apex.  He had 3.0 x 16 mm Promus Premier DES in the mid LAD. The stent was post-dilated with a 3.25 x 12 mm Gillett balloon x 1. The stenosis was taken from 99% down to 0%.   He is feeling much better.   Not shortness of breath   Participating in cardiac rehab. Bruising from the plavix.   But no real PC   Oct. 24, 2016:  Anthony Carpenter is doing well. Walking regularly  Has several projects doing on. Developing some land  Has some bruising and occasional nose bleeds.   March 13, 2016:  Staying active.    Occasional nose bleeds   Oct. 23, 2017:  Doing well .  No CP or dyspnea. Cooked 80 gallons of [place] stew this weekend  Has occasional nose bleed   Mar 28, 2017:  Doing well.   No angina Working on his housing development   October 04, 2017:  Doing well .   Has lots of bruising but not so much nosebleeding. We have stopped the ASA and continued the plavix.    May 20, 2018: Anthony Carpenter is seen today for follow-up of his coronary artery disease and hyperlipidemia No CP ,  No dyspnea.   Still bruising.   Is still on Plavix,   Is off ASA  No recent nose bleeds  Jan. 6, 2020 Has mild DOE if he exrcises vigorously No return of the angina he had last year  June 02, 2019 :  Has CAD.   Was having angina,  Started Imdur.  Pain significantly improved. Not able to do as much as he thinks he would do .  Walks 1/2 mile without any problems.  Can climb 2 flights of stairs.   Has not needed NTG recently   September 04, 2019:  Anthony Carpenter is  seen today for a follow-up visit.  He has a history of coronary artery disease.  When I last saw him in July he was having some unusual types of chest pains.  He was able to exercise without too much difficulty.  He did not want have a heart catheterization but wanted to be reevaluated in several months and returns today for further evaluation.  Still has some CP if he "rushes" too much   Has some angina - not as bad as his previous CP.  Imdur seems to help   March 02, 2020: Anthony Carpenter is seen today for follow-up of his coronary artery disease.  He has had some unusual type of chest pains in the past but he did not want to have a heart catheterization. Blood pressure and heart rate remain well controlled. Can walk for abut a mile ,   Gets out of breath if he rushes too much .  Has not had to take a NTG .   Oct. 14, 2021: Anthony Carpenter is seen today for follow up of his CAD, hyperlipidemia No CP. 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 Wants to try Sildenafil  Will DC his Imdur   Feb. 17, 2023 Anthony Carpenter is seen today for follow up of his CAD He has been having more angina like chest pain recently  Added Imdur - which has helped    Oct. 16,,2023  Anthony Carpenter is seen for follow up of his CAD Was having symptoms of UAP when I saw him Repeat cath feb. 2023 showed widely patent stents. No CP, does have some DOE   Will check labs - CBC, BMP, lipids, LFTs Will get an echo - his last echo from 2018 showed mild AS ( mean AV gradient of 9 mmHg)      Past Medical History:  Diagnosis Date   Aortic stenosis, mild    Bicuspid aortic valve    Bradycardia    Carotid bruit present    a. Right side   Coronary  artery disease    a.  CAD s/p PTCA/Taxus stent to mid-prox LAD 2006  b.  cath 10/02/14 with 60% focal instent restenosis in LAD prox to the Diag takeoff, 30% mid AV groove left circ stenosis and luminal irregularities in a large dominant RCA. FFR of LAD was 0.85 and not felt to be HD significant to warrant PCI.    Glaucoma    Hyperlipidemia    Hypertension    Myocardial infarction [facility]) 2006   Pulmonary nodule seen on imaging study    a. seen on CT during 09/2014 admission, may require follow up imaging     Past Surgical History:  Procedure Laterality Date   CARDIAC CATHETERIZATION  07/28/2005   Est EF of 55% -- Single-vessel obstructive disease in the proximal/mid left anterior descending -- Preserved left ventricular systolic function with good anterior wall motion   cataracts  5-6 years ago   COLONOSCOPY  ? 2009   CORONARY ANGIOPLASTY WITH STENT PLACEMENT  07/28/2005   Successful PTCA and stenting of the mid and proximal left anterior descending artery.  The patient is stable leaving the catheterization laboratory -- Thayer Headings, M.D.     CORONARY STENT PLACEMENT  02/11/2015   3.0 x 16 mm Promus Premier DES to the mLAD for ISR   LEFT HEART CATH AND CORONARY ANGIOGRAPHY N/A 01/10/2022   Procedure: LEFT HEART CATH AND CORONARY ANGIOGRAPHY;  Surgeon: Leonie Man, MD;  Location: [facility];  Service: Cardiovascular;  Laterality: N/A;   LEFT HEART CATHETERIZATION WITH CORONARY ANGIOGRAM N/A 10/02/2014   Procedure: LEFT HEART CATHETERIZATION WITH CORONARY ANGIOGRAM;  Surgeon: Troy Sine, MD;  Location: [facility] CATH LAB;  Service: Cardiovascular;  Laterality: N/A;   LEFT HEART CATHETERIZATION WITH CORONARY ANGIOGRAM N/A 02/11/2015   Procedure: LEFT HEART CATHETERIZATION WITH CORONARY ANGIOGRAM;  Surgeon: Burnell Blanks, MD; LAD 99% ISR, CFX okay, OM1 50%, RCA 30%, D1 30% (jailed by the LAD stent), EF 45-50 %   THULIUM LASER TURP (TRANSURETHRAL RESECTION OF PROSTATE) N/A 08/06/2018   Procedure:  THULIUM LASER TURP (TRANSURETHRAL RESECTION OF PROSTATE);  Surgeon: Festus Aloe, MD;  Location: [facility];  Service: Urology;  Laterality: N/A;     Current Outpatient Medications  Medication Sig Dispense Refill   atorvastatin (LIPITOR) 80 MG tablet TAKE 1 TABLET BY MOUTH DAILY AT  6 PM 90 tablet 3   clopidogrel (PLAVIX) 75 MG tablet TAKE 1 TABLET BY MOUTH  DAILY WITH BREAKFAST 90 tablet 2   dorzolamide-timolol (COSOPT) 22.3-6.8 MG/ML ophthalmic solution Place 1 drop into both eyes 2 (two) times daily.      latanoprost (XALATAN) 0.005 % ophthalmic solution Place 1 drop into both eyes at bedtime.      lisinopril (ZESTRIL) 5 MG tablet Take 1 tablet (5 mg total) by mouth daily. 90 tablet 3   Multiple Vitamins-Minerals (ICAPS AREDS FORMULA PO) Take 1 tablet by mouth 2 (two) times daily.      nitroGLYCERIN (NITROSTAT) 0.4 MG SL tablet Place 1 tablet (0.4 mg total) under the tongue every 5 (five) minutes as needed. For chest pain. 25 tablet 12   isosorbide mononitrate (IMDUR) 30 MG 24 hr tablet Take 1 tablet (30 mg total) by mouth daily. (Patient not taking: Reported on 09/04/2022) 90 tablet 3   No current facility-administered medications for this visit.    Allergies:   Patient has no known allergies.    Social History:  The patient  reports that he has never smoked. He has  never used smokeless tobacco. He reports that he does not drink alcohol and does not use drugs.   Family History:  The patient's family history includes Cancer in an other family member; Lung cancer in his father.    ROS:   Noted in current history, otherwise review of systems is negative. Physical Exam: Blood pressure 118/64, pulse 66, height 5\' 10"  (1.778 m), weight 183 lb 3.2 oz (83.1 kg), SpO2 99 %.    GEN:  elderly , thin male  in no acute distress HEENT: Normal NECK: No JVD; No carotid bruits LYMPHATICS: No lymphadenopathy CARDIAC: RRR , soft systolic murmur  RESPIRATORY:  Clear to auscultation without  rales, wheezing or rhonchi  ABDOMEN: Soft, non-tender, non-distended MUSCULOSKELETAL:  No edema; No deformity  SKIN: Warm and dry NEUROLOGIC:  Alert and oriented x 3    EKG:      Recent Labs: 03/06/2022: ALT 17; BUN 15; Creatinine, Ser 1.04; Hemoglobin 13.7; Platelets 145; Potassium 4.3; Sodium 142; TSH 1.400    Lipid Panel    Component Value Date/Time   CHOL 95 (L) 03/06/2022 1103   TRIG 54 03/06/2022 1103   HDL 36 (L) 03/06/2022 1103   CHOLHDL 2.6 03/06/2022 1103   CHOLHDL 2.8 09/11/2016 0904   VLDL 14 09/11/2016 0904   LDLCALC 46 03/06/2022 1103      Wt Readings from Last 3 Encounters:  09/04/22 183 lb 3.2 oz (83.1 kg)  03/06/22 191 lb 6.4 oz (86.8 kg)  01/10/22 190 lb (86.2 kg)      Other studies Reviewed: Additional studies/ records that were reviewed today include: . Review of the above records demonstrates:    ASSESSMENT AND PLAN:   1. Coronary artery disease-  no angina .   Cath earlier this year showed patent stents  .  2. Hyperlipidemia-   check labs today      3.  Generalized fatigue:   associated with some DOE .  I advised him to try exercising on a regular basis.  He is fairly active at his job as a 01/12/22 but would like for him to get out and walk at least 1 or 2 miles a day 3 to 4 days a week to see if he can build up some conditioning.        Current medicines are reviewed at length with the patient today.  The patient does not have concerns regarding medicines.  The following changes have been made:  no change  Labs/ tests ordered today include:   Orders Placed This Encounter  Procedures   Lipid Profile   Hepatic function panel   CBC   Basic Metabolic Panel (BMET)   ECHOCARDIOGRAM COMPLETE     Disposition:       Signed, Product/process development scientist, MD  09/04/2022 8:33 AM    [facility] [address] [place], [place], [place]  [place] Phone: [phone]; Fax: [phone]

## 2022-09-04 ENCOUNTER — Ambulatory Visit: Payer: Medicare Other | Attending: Cardiovascular Disease | Admitting: Cardiovascular Disease

## 2022-09-04 ENCOUNTER — Encounter: Payer: Self-pay | Admitting: Cardiovascular Disease

## 2022-09-04 VITALS — BP 118/64 | HR 66 | Ht 70.0 in | Wt 183.2 lb

## 2022-09-04 DIAGNOSIS — I10 Essential (primary) hypertension: Secondary | ICD-10-CM

## 2022-09-04 DIAGNOSIS — I25119 Atherosclerotic heart disease of native coronary artery with unspecified angina pectoris: Secondary | ICD-10-CM | POA: Diagnosis not present

## 2022-09-04 DIAGNOSIS — E782 Mixed hyperlipidemia: Secondary | ICD-10-CM | POA: Diagnosis not present

## 2022-09-04 DIAGNOSIS — R0609 Other forms of dyspnea: Secondary | ICD-10-CM | POA: Insufficient documentation

## 2022-09-04 DIAGNOSIS — I350 Nonrheumatic aortic (valve) stenosis: Secondary | ICD-10-CM | POA: Diagnosis not present

## 2022-09-04 NOTE — Patient Instructions (Signed)
Medication Instructions:   *If you need a refill on your cardiac medications before your next appointment, please call your pharmacy*   Lab Work: BMET, HEPATIC, LIPID, CBC If you have labs (blood work) drawn today and your tests are completely normal, you will receive your results only by: [place] (if you have MyChart) OR A paper copy in the mail If you have any lab test that is abnormal or we need to change your treatment, we will call you to review the results.   Testing/Procedures: Your physician has requested that you have an echocardiogram. Echocardiography is a painless test that uses sound waves to create images of your heart. It provides your doctor with information about the size and shape of your heart and how well your heart's chambers and valves are working. This procedure takes approximately one hour. There are no restrictions for this procedure. Please do NOT wear cologne, perfume, aftershave, or lotions (deodorant is allowed). Please arrive 15 minutes prior to your appointment time.    Follow-Up: At [facility], you and your health needs are our priority.  As part of our continuing mission to provide you with exceptional heart care, we have created designated Provider Care Teams.  These Care Teams include your primary Cardiologist (physician) and Advanced Practice Providers (APPs -  Physician Assistants and Nurse Practitioners) who all work together to provide you with the care you need, when you need it.  We recommend signing up for the patient portal called "MyChart".  Sign up information is provided on this After Visit Summary.  MyChart is used to connect with patients for Virtual Visits (Telemedicine).  Patients are able to view lab/test results, encounter notes, upcoming appointments, etc.  Non-urgent messages can be sent to your provider as well.   To learn more about what you can do with MyChart, go to [email or web address].    Your next  appointment:   4 month(s)  The format for your next appointment:   In Person  Provider:   Mertie Moores, MD     Other Instructions DASH Eating Plan DASH stands for Dietary Approaches to Stop Hypertension. The DASH eating plan is a healthy eating plan that has been shown to: Reduce high blood pressure (hypertension). Reduce your risk for type 2 diabetes, heart disease, and stroke. Help with weight loss. What are tips for following this plan? Reading food labels Check food labels for the amount of salt (sodium) per serving. Choose foods with less than 5 percent of the Daily Value of sodium. Generally, foods with less than 300 milligrams (mg) of sodium per serving fit into this eating plan. To find whole grains, look for the word "whole" as the first word in the ingredient list. Shopping Buy products labeled as "low-sodium" or "no salt added." Buy fresh foods. Avoid canned foods and pre-made or frozen meals. Cooking Avoid adding salt when cooking. Use salt-free seasonings or herbs instead of table salt or sea salt. Check with your health care provider or pharmacist before using salt substitutes. Do not fry foods. Cook foods using healthy methods such as baking, boiling, grilling, roasting, and broiling instead. Cook with heart-healthy oils, such as olive, canola, avocado, soybean, or sunflower oil. Meal planning  Eat a balanced diet that includes: 4 or more servings of fruits and 4 or more servings of vegetables each day. Try to fill one-half of your plate with fruits and vegetables. 6-8 servings of whole grains each day. Less than 6 oz (170 g) of lean meat,  poultry, or fish each day. A 3-oz (85-g) serving of meat is about the same size as a deck of cards. One egg equals 1 oz (28 g). 2-3 servings of low-fat dairy each day. One serving is 1 cup (237 mL). 1 serving of nuts, seeds, or beans 5 times each week. 2-3 servings of heart-healthy fats. Healthy fats called omega-3 fatty acids are  found in foods such as walnuts, flaxseeds, fortified milks, and eggs. These fats are also found in cold-water fish, such as sardines, salmon, and mackerel. Limit how much you eat of: Canned or prepackaged foods. Food that is high in trans fat, such as some fried foods. Food that is high in saturated fat, such as fatty meat. Desserts and other sweets, sugary drinks, and other foods with added sugar. Full-fat dairy products. Do not salt foods before eating. Do not eat more than 4 egg yolks a week. Try to eat at least 2 vegetarian meals a week. Eat more home-cooked food and less restaurant, buffet, and fast food. Lifestyle When eating at a restaurant, ask that your food be prepared with less salt or no salt, if possible. If you drink alcohol: Limit how much you use to: 0-1 drink a day for women who are not pregnant. 0-2 drinks a day for men. Be aware of how much alcohol is in your drink. In the [place] one drink equals one 12 oz bottle of beer (355 mL), one 5 oz glass of wine (148 mL), or one 1 oz glass of hard liquor (44 mL). General information Avoid eating more than 2,300 mg of salt a day. If you have hypertension, you may need to reduce your sodium intake to 1,500 mg a day. Work with your health care provider to maintain a healthy body weight or to lose weight. Ask what an ideal weight is for you. Get at least 30 minutes of exercise that causes your heart to beat faster (aerobic exercise) most days of the week. Activities may include walking, swimming, or biking. Work with your health care provider or dietitian to adjust your eating plan to your individual calorie needs. What foods should I eat? Fruits All fresh, dried, or frozen fruit. Canned fruit in natural juice (without added sugar). Vegetables Fresh or frozen vegetables (raw, steamed, roasted, or grilled). Low-sodium or reduced-sodium tomato and vegetable juice. Low-sodium or reduced-sodium tomato sauce and tomato paste. Low-sodium  or reduced-sodium canned vegetables. Grains Whole-grain or whole-wheat bread. Whole-grain or whole-wheat pasta. Brown rice. Anthony Carpenter. Bulgur. Whole-grain and low-sodium cereals. Pita bread. Low-fat, low-sodium crackers. Whole-wheat flour tortillas. Meats and other proteins Skinless chicken or [place]. Ground chicken or [place]. Pork with fat trimmed off. Fish and seafood. Egg whites. Dried beans, peas, or lentils. Unsalted nuts, nut butters, and seeds. Unsalted canned beans. Lean cuts of beef with fat trimmed off. Low-sodium, lean precooked or cured meat, such as sausages or meat loaves. Dairy Low-fat (1%) or fat-free (skim) milk. Reduced-fat, low-fat, or fat-free cheeses. Nonfat, low-sodium ricotta or cottage cheese. Low-fat or nonfat yogurt. Low-fat, low-sodium cheese. Fats and oils Soft margarine without trans fats. Vegetable oil. Reduced-fat, low-fat, or light mayonnaise and salad dressings (reduced-sodium). Canola, safflower, olive, avocado, soybean, and sunflower oils. Avocado. Seasonings and condiments Herbs. Spices. Seasoning mixes without salt. Other foods Unsalted popcorn and pretzels. Fat-free sweets. The items listed above may not be a complete list of foods and beverages you can eat. Contact a dietitian for more information. What foods should I avoid? Fruits Canned fruit in a light  or heavy syrup. Fried fruit. Fruit in cream or butter sauce. Vegetables Creamed or fried vegetables. Vegetables in a cheese sauce. Regular canned vegetables (not low-sodium or reduced-sodium). Regular canned tomato sauce and paste (not low-sodium or reduced-sodium). Regular tomato and vegetable juice (not low-sodium or reduced-sodium). Anthony Carpenter. Olives. Grains Baked goods made with fat, such as croissants, muffins, or some breads. Dry pasta or rice meal packs. Meats and other proteins Fatty cuts of meat. Ribs. Fried meat. Anthony Carpenter. Bologna, salami, and other precooked or cured meats, such as sausages or  meat loaves. Fat from the back of a pig (fatback). Bratwurst. Salted nuts and seeds. Canned beans with added salt. Canned or smoked fish. Whole eggs or egg yolks. Chicken or [place] with skin. Dairy Whole or 2% milk, cream, and half-and-half. Whole or full-fat cream cheese. Whole-fat or sweetened yogurt. Full-fat cheese. Nondairy creamers. Whipped toppings. Processed cheese and cheese spreads. Fats and oils Butter. Stick margarine. Lard. Shortening. Ghee. Bacon fat. Tropical oils, such as coconut, palm kernel, or palm oil. Seasonings and condiments Onion salt, garlic salt, seasoned salt, table salt, and sea salt. Worcestershire sauce. Tartar sauce. Barbecue sauce. Teriyaki sauce. Soy sauce, including reduced-sodium. Steak sauce. Canned and packaged gravies. Fish sauce. Oyster sauce. Cocktail sauce. Store-bought horseradish. Ketchup. Mustard. Meat flavorings and tenderizers. Bouillon cubes. Hot sauces. Pre-made or packaged marinades. Pre-made or packaged taco seasonings. Relishes. Regular salad dressings. Other foods Salted popcorn and pretzels. The items listed above may not be a complete list of foods and beverages you should avoid. Contact a dietitian for more information. Where to find more information National Heart, Lung, and Blood Institute: PopSteam.is American Heart Association: www.heart.org Academy of Nutrition and Dietetics: www.eatright.org National Kidney Foundation: www.kidney.org Summary The DASH eating plan is a healthy eating plan that has been shown to reduce high blood pressure (hypertension). It may also reduce your risk for type 2 diabetes, heart disease, and stroke. When on the DASH eating plan, aim to eat more fresh fruits and vegetables, whole grains, lean proteins, low-fat dairy, and heart-healthy fats. With the DASH eating plan, you should limit salt (sodium) intake to 2,300 mg a day. If you have hypertension, you may need to reduce your sodium intake to 1,500 mg a  day. Work with your health care provider or dietitian to adjust your eating plan to your individual calorie needs. This information is not intended to replace advice given to you by your health care provider. Make sure you discuss any questions you have with your health care provider. Document Revised: 10/10/2019 Document Reviewed: 10/10/2019 Elsevier Patient Education  2023 Elsevier Inc.   Important Information About Sugar

## 2022-09-05 LAB — BASIC METABOLIC PANEL
BUN/Creatinine Ratio: 16 (ref 10–24)
BUN: 16 mg/dL (ref 8–27)
CO2: 25 mmol/L (ref 20–29)
Calcium: 9.5 mg/dL (ref 8.6–10.2)
Chloride: 107 mmol/L — ABNORMAL HIGH (ref 96–106)
Creatinine, Ser: 0.97 mg/dL (ref 0.76–1.27)
Glucose: 96 mg/dL (ref 70–99)
Potassium: 4 mmol/L (ref 3.5–5.2)
Sodium: 143 mmol/L (ref 134–144)
eGFR: 78 mL/min/{1.73_m2} (ref >59)

## 2022-09-05 LAB — CBC
Hematocrit: 42.7 % (ref 37.5–51.0)
Hemoglobin: 14.1 g/dL (ref 13.0–17.7)
MCH: 28.9 pg (ref 26.6–33.0)
MCHC: 33 g/dL (ref 31.5–35.7)
MCV: 88 fL (ref 79–97)
Platelets: 154 10*3/uL (ref 150–450)
RBC: 4.88 x10E6/uL (ref 4.14–5.80)
RDW: 13.5 % (ref 11.6–15.4)
WBC: 7.1 10*3/uL (ref 3.4–10.8)

## 2022-09-05 LAB — LIPID PANEL
Chol/HDL Ratio: 2.8 ratio (ref 0.0–5.0)
Cholesterol, Total: 106 mg/dL (ref 100–199)
HDL: 38 mg/dL — ABNORMAL LOW (ref >39)
LDL Chol Calc (NIH): 53 mg/dL (ref 0–99)
Triglycerides: 71 mg/dL (ref 0–149)
VLDL Cholesterol Cal: 15 mg/dL (ref 5–40)

## 2022-09-05 LAB — HEPATIC FUNCTION PANEL
ALT: 14 IU/L (ref 0–44)
AST: 16 IU/L (ref 0–40)
Albumin: 4.1 g/dL (ref 3.7–4.7)
Alkaline Phosphatase: 112 IU/L (ref 44–121)
Bilirubin Total: 0.7 mg/dL (ref 0.0–1.2)
Bilirubin, Direct: 0.26 mg/dL (ref 0.00–0.40)
Total Protein: 6.4 g/dL (ref 6.0–8.5)

## 2022-09-20 ENCOUNTER — Ambulatory Visit (HOSPITAL_COMMUNITY): Payer: Medicare Other | Attending: Cardiovascular Disease

## 2022-09-20 DIAGNOSIS — I350 Nonrheumatic aortic (valve) stenosis: Secondary | ICD-10-CM | POA: Diagnosis present

## 2022-09-20 DIAGNOSIS — I25119 Atherosclerotic heart disease of native coronary artery with unspecified angina pectoris: Secondary | ICD-10-CM | POA: Diagnosis not present

## 2022-09-20 DIAGNOSIS — E782 Mixed hyperlipidemia: Secondary | ICD-10-CM | POA: Diagnosis not present

## 2022-09-20 DIAGNOSIS — R0609 Other forms of dyspnea: Secondary | ICD-10-CM | POA: Insufficient documentation

## 2022-09-20 DIAGNOSIS — I10 Essential (primary) hypertension: Secondary | ICD-10-CM | POA: Insufficient documentation

## 2022-09-20 LAB — ECHOCARDIOGRAM COMPLETE
AR max vel: 1.27 cm2
AV Area VTI: 1.41 cm2
AV Area mean vel: 1.39 cm2
AV Mean grad: 6 mmHg
AV Peak grad: 12.1 mmHg
Ao pk vel: 1.74 m/s
S' Lateral: 2.5 cm

## 2022-09-25 ENCOUNTER — Telehealth: Payer: Self-pay | Admitting: Cardiovascular Disease

## 2022-09-25 NOTE — Telephone Encounter (Signed)
Thayer Headings, MD 09/22/2022  9:16 AM EDT     Norml LV systolic function. Mild MR The aortic valve is thickened and calcified.  Mild AS   Continue current plan / meds

## 2022-09-25 NOTE — Telephone Encounter (Signed)
Pt aware of echo results ./cy 

## 2022-09-25 NOTE — Telephone Encounter (Signed)
Pt returning nurses call regarding Echo results. Please advise 

## 2022-10-19 ENCOUNTER — Other Ambulatory Visit: Payer: Self-pay

## 2022-10-19 ENCOUNTER — Encounter (HOSPITAL_BASED_OUTPATIENT_CLINIC_OR_DEPARTMENT_OTHER): Payer: Self-pay

## 2022-10-19 ENCOUNTER — Emergency Department (HOSPITAL_BASED_OUTPATIENT_CLINIC_OR_DEPARTMENT_OTHER): Payer: Medicare Other

## 2022-10-19 ENCOUNTER — Emergency Department (HOSPITAL_BASED_OUTPATIENT_CLINIC_OR_DEPARTMENT_OTHER)
Admission: EM | Admit: 2022-10-19 | Discharge: 2022-10-19 | Disposition: A | Discharge status: Home / Self Care | Payer: Medicare Other | Attending: Emergency Medicine | Admitting: Emergency Medicine

## 2022-10-19 DIAGNOSIS — I10 Essential (primary) hypertension: Secondary | ICD-10-CM | POA: Diagnosis not present

## 2022-10-19 DIAGNOSIS — W01198A Fall on same level from slipping, tripping and stumbling with subsequent striking against other object, initial encounter: Secondary | ICD-10-CM | POA: Insufficient documentation

## 2022-10-19 DIAGNOSIS — Z955 Presence of coronary angioplasty implant and graft: Secondary | ICD-10-CM | POA: Insufficient documentation

## 2022-10-19 DIAGNOSIS — I25119 Atherosclerotic heart disease of native coronary artery with unspecified angina pectoris: Secondary | ICD-10-CM | POA: Insufficient documentation

## 2022-10-19 DIAGNOSIS — S0990XA Unspecified injury of head, initial encounter: Secondary | ICD-10-CM | POA: Insufficient documentation

## 2022-10-19 DIAGNOSIS — S29002A Unspecified injury of muscle and tendon of back wall of thorax, initial encounter: Secondary | ICD-10-CM | POA: Diagnosis present

## 2022-10-19 DIAGNOSIS — Z79899 Other long term (current) drug therapy: Secondary | ICD-10-CM | POA: Diagnosis not present

## 2022-10-19 DIAGNOSIS — S22089A Unspecified fracture of T11-T12 vertebra, initial encounter for closed fracture: Secondary | ICD-10-CM | POA: Insufficient documentation

## 2022-10-19 DIAGNOSIS — S22080A Wedge compression fracture of T11-T12 vertebra, initial encounter for closed fracture: Secondary | ICD-10-CM

## 2022-10-19 MED ORDER — ACETAMINOPHEN 500 MG PO TABS
1000.0000 mg | ORAL_TABLET | Freq: Once | ORAL | Status: AC
  Administered 2022-10-19: 1000 mg via ORAL
  Filled 2022-10-19: qty 2

## 2022-10-19 NOTE — ED Triage Notes (Signed)
Pt advises he fell "day before yesterday." Landed on back, hit head, on plavix. States he called PCP today, advised to have CT/ scans "to ensure no bleeding." Pt c/o mid-thoracic back pain, "bothersome" L hand, bleeding controlled w dressing in triage. Denies neuro changes, at baseline.

## 2022-10-19 NOTE — ED Notes (Signed)
Pt refused to stay for his brace.  [facility] called and notified.  Pt given information regarding phone # and address of clinic to follow up

## 2022-10-19 NOTE — ED Provider Notes (Signed)
[facility] Provider Note  CSN: [id number] Arrival date & time: 10/19/22 1144  Chief Complaint(s) Fall (2 days ago, on plavix)  HPI Anthony Carpenter is a 81 y.o. male with history of coronary artery disease, hypertension, hyperlipidemia presenting to the emergency department for fall.  Patient reports that he lost balance and fell backward, did not lose consciousness or have a fainting episode prior to his fall.  He denies any recurrent episodes of loss of balance.  This occurred 2 days ago.  He went to his primary doctor today and they advised him to come to the emergency department for further evaluation.  He denies any numbness, tingling, headaches, nausea, vomiting.  He reports his pain is in his mid thoracic back and his left hand.   Past Medical History Past Medical History:  Diagnosis Date   Aortic stenosis, mild    Bicuspid aortic valve    Bradycardia    Carotid bruit present    a. Right side   Coronary artery disease    a. CAD s/p PTCA/Taxus stent to mid-prox LAD 2006  b.  cath 10/02/14 with 60% focal instent restenosis in LAD prox to the Diag takeoff, 30% mid AV groove left circ stenosis and luminal irregularities in a large dominant RCA. FFR of LAD was 0.85 and not felt to be HD significant to warrant PCI.    Glaucoma    Hyperlipidemia    Hypertension    Myocardial infarction [facility]) 2006   Pulmonary nodule seen on imaging study    a. 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 Authorizing Provider  atorvastatin (LIPITOR) 80 MG tablet TAKE 1 TABLET BY MOUTH DAILY AT  6 PM 03/21/22   Nahser, Wonda Cheng, MD  clopidogrel (PLAVIX) 75 MG tablet TAKE 1 TABLET BY MOUTH  DAILY WITH BREAKFAST 03/06/22   Nahser, Wonda Cheng, MD  dorzolamide-timolol (COSOPT) 22.3-6.8 MG/ML ophthalmic solution Place 1 drop into both eyes 2 (two) times daily.     [provider]  isosorbide mononitrate (IMDUR) 30 MG 24 hr tablet Take 1 tablet (30 mg total) by mouth daily. Patient not taking: Reported on 09/04/2022 01/06/22   Nahser, Wonda Cheng, MD  latanoprost (XALATAN) 0.005 % ophthalmic solution Place 1 drop into both eyes at bedtime.     [provider]  lisinopril (ZESTRIL) 5 MG tablet Take 1 tablet (5 mg total) by mouth daily. 04/19/22   Swinyer, Lanice Schwab, NP  Multiple Vitamins-Minerals (ICAPS AREDS FORMULA PO) Take 1 tablet by mouth 2 (two) times daily.     [provider]  nitroGLYCERIN (NITROSTAT) 0.4 MG SL tablet Place 1 tablet (0.4 mg total) under the tongue every 5 (five) minutes as needed. For chest pain. 03/06/22   Swinyer, Lanice Schwab, NP  Past Surgical History Past Surgical History:  Procedure Laterality Date   CARDIAC CATHETERIZATION  07/28/2005   Est EF of 55% -- Single-vessel obstructive disease in the proximal/mid left anterior descending -- Preserved left ventricular systolic function with good anterior wall motion   cataracts  5-6 years ago   COLONOSCOPY  ? 2009   CORONARY ANGIOPLASTY WITH STENT PLACEMENT  07/28/2005   Successful PTCA and stenting of the mid and proximal left anterior descending artery.  The patient is stable leaving the catheterization  laboratory -- Thayer Headings, M.D.     CORONARY STENT PLACEMENT  02/11/2015   3.0 x 16 mm Promus Premier DES to the mLAD for ISR   LEFT HEART CATH AND CORONARY ANGIOGRAPHY N/A 01/10/2022   Procedure: LEFT HEART CATH AND CORONARY ANGIOGRAPHY;  Surgeon: Leonie Man, MD;  Location: [facility];  Service: Cardiovascular;  Laterality: N/A;   LEFT HEART CATHETERIZATION WITH CORONARY ANGIOGRAM N/A 10/02/2014   Procedure: LEFT HEART CATHETERIZATION WITH CORONARY ANGIOGRAM;  Surgeon: Troy Sine, MD;  Location: [facility];  Service: Cardiovascular;  Laterality: N/A;   LEFT HEART CATHETERIZATION WITH CORONARY ANGIOGRAM N/A 02/11/2015   Procedure: LEFT HEART CATHETERIZATION WITH CORONARY ANGIOGRAM;  Surgeon: Burnell Blanks, MD; LAD 99% ISR, CFX okay, OM1 50%, RCA 30%, D1 30% (jailed by the LAD stent), EF 45-50 %   THULIUM LASER TURP (TRANSURETHRAL RESECTION OF PROSTATE) N/A 08/06/2018   Procedure: THULIUM LASER TURP (TRANSURETHRAL RESECTION OF PROSTATE);  Surgeon: Festus Aloe, MD;  Location: [facility];  Service: Urology;  Laterality: N/A;   Family History Family History  Problem Relation Age of Onset   Lung cancer Father    Cancer Other    Heart disease Neg Hx     Social History Social History   Tobacco Use   Smoking status: Never   Smokeless tobacco: Never  Vaping Use   Vaping Use: Never used  Substance Use Topics   Alcohol use: No   Drug use: No   Allergies Patient has no known allergies.  Review of Systems Review of Systems  All other systems reviewed and are negative.   Physical Exam Vital Signs  I have reviewed the triage vital signs BP 133/79 Comment: Simultaneous filing. User may not have seen previous data.  Pulse 67 Comment: Simultaneous filing. User may not have seen previous data.  Temp 98 F (36.7 C) (Oral)   Resp 16 Comment: Simultaneous filing. User may not have seen previous data.  SpO2 100% Comment: Simultaneous filing. User may not have  seen previous data. Physical Exam Vitals and nursing note reviewed.  Constitutional:      General: He is not in acute distress.    Appearance: Normal appearance.  HENT:     Mouth/Throat:     Mouth: Mucous membranes are moist.  Eyes:     Conjunctiva/sclera: Conjunctivae normal.  Cardiovascular:     Rate and Rhythm: Normal rate and regular rhythm.  Pulmonary:     Effort: Pulmonary effort is normal. No respiratory distress.     Breath sounds: Normal breath sounds.  Abdominal:     General: Abdomen is flat.     Palpations: Abdomen is soft.     Tenderness: There is no abdominal tenderness.  Musculoskeletal:     Right lower leg: No edema.     Left lower leg: No edema.     Comments: No midline C, T, L-spine tenderness with expection of the lower thoracic tenderness with no step-off.  No chest wall tenderness or crepitus.  Full painless range of motion at the bilateral upper extremities including the shoulders, elbows, wrists, hand and fingers, and in the bilateral lower extremities including the hips, knees, ankle, toes.  No focal bony tenderness, injury or deformity other than thoracic tenderness as mentioned above and some mild tenderness to the dorsum of the left hand, no snuffbox tenderness   Skin:    General: Skin is warm and dry.     Capillary Refill: Capillary refill takes less than 2 seconds.     Comments: Small skin tear on the dorsum of the left hand  Neurological:     Mental Status: He is alert and oriented to person, place, and time. Mental status is at baseline.  Psychiatric:        Mood and Affect: Mood normal.        Behavior: Behavior normal.     ED Results and Treatments Labs (all labs ordered are listed, but only abnormal results are displayed) Labs Reviewed - No data to display                                                                                                                        Radiology CT Head Wo Contrast  Result Date: 10/19/2022 CLINICAL  DATA:  Head trauma.  Patient fell 2 days prior. EXAM: CT HEAD WITHOUT CONTRAST TECHNIQUE: Contiguous axial images were obtained from the base of the skull through the vertex without intravenous contrast. RADIATION DOSE REDUCTION: This exam was performed according to the departmental dose-optimization program which includes automated exposure control, adjustment of the mA and/or kV according to patient size and/or use of iterative reconstruction technique. COMPARISON:  None Available. FINDINGS: Brain: No acute intracranial hemorrhage. No focal mass lesion. No CT evidence of acute infarction. No midline shift or mass effect. No hydrocephalus. Basilar cisterns are patent. There are periventricular and subcortical white matter hypodensities. Generalized cortical atrophy. Vascular: No hyperdense vessel or unexpected calcification. Skull: Normal. Negative for fracture or focal lesion. Sinuses/Orbits: Paranasal sinuses and mastoid air cells are clear. Orbits are clear. Other: None. IMPRESSION: 1. No acute intracranial trauma. 2. Mild atrophy and white matter microvascular disease. Electronically Signed   By: Suzy Bouchard M.D.   On: 10/19/2022 12:54   DG Hand Complete Left  Result Date: 10/19/2022 CLINICAL DATA:  Fall. EXAM: LEFT HAND - COMPLETE 3+ VIEW COMPARISON:  None Available. FINDINGS: There is no evidence of fracture or dislocation. Severe degenerative changes seen involving first carpometacarpal joint. Soft tissues are unremarkable. IMPRESSION: Severe osteoarthritis of first carpometacarpal joint. No acute abnormality seen. Electronically Signed   By: Marijo Conception M.D.   On: 10/19/2022 12:52   DG Thoracic Spine 2 View  Result Date: 10/19/2022 CLINICAL DATA:  Fall EXAM: THORACIC SPINE 2 VIEWS COMPARISON:  None Available. FINDINGS: Wedge deformity of the T12 vertebra with approximately 30% height loss. No lytic or blastic osseous lesions. Mild multilevel degenerative changes. Atherosclerotic  calcifications present in  the transverse aorta. A metallic stent projects over the mid left anterior descending coronary artery. IMPRESSION: Age indeterminate T12 compression fracture with approximately 30% height loss. Aortic atherosclerotic calcifications. Electronically Signed   By: Malachy Moan M.D.   On: 10/19/2022 12:48    Pertinent labs & imaging results that were available during my care of the patient were reviewed by me and considered in my medical decision making (see MDM for details).  Medications Ordered in ED Medications  acetaminophen (TYLENOL) tablet 1,000 mg (1,000 mg Oral Given 10/19/22 1343)                                                                                                                                     Procedures Procedures  (including critical care time)  Medical Decision Making / ED Course   MDM:  81 year old male presenting to the emergency department for fall.  Patient overall well-appearing, exam with small wound to the left hand, hand tenderness and midline thoracic back pain.  Imaging shows no acute intracranial hemorrhage.  Patient does have some lower thoracic midline tenderness with a 12  Clinical Course as of 10/19/22 1434  Thu Oct 19, 2022  1430 Discussed T12 compression fracture with Dr. Jake Samples of neurosurgery.  He recommends TLSO brace for comfort.  Does not need to wear it if pain is controlled.  The patient does not want to wait for a TLSO brace and reports he feels okay.  He will follow-up with Dr. Jake Samples. Will discharge patient to home. All questions answered. Patient comfortable with plan of discharge. Return precautions discussed with patient and specified on the after visit summary.  [WS]    Clinical Course User Index [WS] Lonell Grandchild, MD     Additional history obtained: -Additional history obtained from spouse -External records from outside source obtained and reviewed including: Chart review including  previous notes, labs, imaging, consultation notes including admission 01/10/22      Imaging Studies ordered: I ordered imaging studies including CT head, XR thoracic spine and XR hand On my interpretation imaging demonstrates T12 compression fracture I independently visualized and interpreted imaging. I agree with the radiologist interpretation   Medicines ordered and prescription drug management: Meds ordered this encounter  Medications   acetaminophen (TYLENOL) tablet 1,000 mg    -I have reviewed the patients home medicines and have made adjustments as needed   Consultations Obtained: I requested consultation with the neurosurgeon,  and discussed lab and imaging findings as well as pertinent plan - they recommend: TLSO brace for comfort   Reevaluation: After the interventions noted above, I reevaluated the patient and found that they have improved  Co morbidities that complicate the patient evaluation  Past Medical History:  Diagnosis Date   Aortic stenosis, mild    Bicuspid aortic valve    Bradycardia    Carotid bruit present    a. Right side   Coronary artery  disease    a. CAD s/p PTCA/Taxus stent to mid-prox LAD 2006  b.  cath 10/02/14 with 60% focal instent restenosis in LAD prox to the Diag takeoff, 30% mid AV groove left circ stenosis and luminal irregularities in a large dominant RCA. FFR of LAD was 0.85 and not felt to be HD significant to warrant PCI.    Glaucoma    Hyperlipidemia    Hypertension    Myocardial infarction [facility]) 2006   Pulmonary nodule seen on imaging study    a. seen on CT during 09/2014 admission, may require follow up imaging       Dispostion: Disposition decision including need for hospitalization was considered, and patient discharged from emergency department.    Final Clinical Impression(s) / ED Diagnoses Final diagnoses:  Compression fracture of T12 vertebra, initial encounter [facility])     This chart was dictated using voice  recognition software.  Despite best efforts to proofread,  errors can occur which can change the documentation meaning.    Cristie Hem, MD 10/19/22 1434

## 2022-10-19 NOTE — Discharge Instructions (Signed)
We evaluated you for your back pain.  Your x-rays showed a compression fracture in your back.  This is not a dangerous fracture but it can be painful.  We offered a back support brace but you did not want to wait for this.  For pain at home, please take 1000 mg of Tylenol every 6 hours as needed for pain.  Please call Dr. Jake Samples of spinal surgery for follow-up.  Please return to the emergency department if you develop any numbness or tingling, weakness, incontinence, severe pain, worsening pain, fevers, or any other concerning symptoms.

## 2022-11-08 ENCOUNTER — Other Ambulatory Visit: Payer: Self-pay | Admitting: Cardiovascular Disease

## 2023-01-07 ENCOUNTER — Encounter: Payer: Self-pay | Admitting: Cardiovascular Disease

## 2023-01-07 NOTE — Progress Notes (Unsigned)
Cardiology Office Note   Date:  01/08/2023   ID:  KEONNE Carpenter, DOB 1941/03/21, MRN [id number]  PCP:  Orpah Melter, MD  Cardiologist:   Mertie Moores, MD   Chief Complaint  Patient presents with   Coronary Artery Disease         1. Coronary artery disease-status post PTCA and stenting of his mid LAD.  ( February 11, 2015)    We placed a 2.75 x 20 mm Taxus stent. It was post dilated using a 3.0 Quantum Maverick, 07/28/05  2. Hyperlipidemia-    3. Possible bicuspid aortic valve   Anthony Carpenter  is doing well.  No chest pain or dyspnea.  He plans on putting in a garden this summer .  He's building his house this year.  He was admitted to the hospital with some chest pain/abdominal pain. A Myoview study did not reveal any ischemia.  He has felt better.  No further episodes of chest pain.  He forgot to take his meds this am.  Feb. 26, 2014: Anthony Carpenter  is doing well.  He has not had any angina.  He remains active - still building houses.   July 14, 2013:  Anthony Carpenter is doing  Well.  No angina.    July 14, 2014:  Anthony Carpenter is doing well.  Still working   No CP, no dyspnea.    Dec. 11, 2015;  Anthony Carpenter was in the hospital for CP recently.  He was having vague chest discomfort.   Cath showed a 60 % instent restenosis.   Pains have resolved.    He is back doing all of his normal activities.  BP has been lower     February 05, 2015:  Anthony Carpenter is a 82 y.o. male who presents for some CP and DOE for the past month. Occurs when he is working outside.   Mowing, speading fertilizer.   Will last as long as he's working . Eases up if he stops.   March 17, 2015:  Anthony Carpenter is a 82 y.o. male who presents for follow up of an episode of unstable angina.  I saw him in March for symptoms worisome of UAP.  CAth showed.   Left main: No obstructive disease.    Left Anterior Descending Artery: Large caliber vessel that courses to the apex. The mid segment is stented. The old stent has 99% restenosis. The  distal vessel is free of obstructive disease. The Diagonal branch is moderate in caliber with ostial 30% stenosis where the vessel is jailed by the LAD stent.   Circumflex Artery: Large caliber vessel with moderate caliber obtuse marginal branch. The obtuse marginal branch has proximal 50% stenosis.   Right Coronary Artery: Large dominant vessel with 30% mid stenosis.  Left Ventricular Angiogram: LVEF=45-50% with hypokinesis of the anterior wall and apex.  He had 3.0 x 16 mm Promus Premier DES in the mid LAD. The stent was post-dilated with a 3.25 x 12 mm Fordoche balloon x 1. The stenosis was taken from 99% down to 0%.   He is feeling much better.   Not shortness of breath   Participating in cardiac rehab. Bruising from the plavix.   But no real PC   Oct. 24, 2016:  Anthony Carpenter is doing well. Walking regularly  Has several projects doing on. Developing some land  Has some bruising and occasional nose bleeds.   March 13, 2016:  Staying active.    Occasional nose bleeds   Oct. 23,  2017:  Doing well .  No CP or dyspnea. Cooked 80 gallons of [place] stew this weekend  Has occasional nose bleed   Mar 28, 2017:  Doing well.   No angina Working on his housing development   October 04, 2017:  Doing well .   Has lots of bruising but not so much nosebleeding. We have stopped the ASA and continued the plavix.    May 20, 2018: Anthony Carpenter is seen today for follow-up of his coronary artery disease and hyperlipidemia No CP ,  No dyspnea.   Still bruising.   Is still on Plavix,   Is off ASA  No recent nose bleeds  Jan. 6, 2020 Has mild DOE if he exrcises vigorously No return of the angina he had last year  June 02, 2019 :  Has CAD.   Was having angina,  Started Imdur.  Pain significantly improved. Not able to do as much as he thinks he would do .  Walks 1/2 mile without any problems.  Can climb 2 flights of stairs.   Has not needed NTG recently   September 04, 2019:  Anthony Carpenter is seen today for a  follow-up visit.  He has a history of coronary artery disease.  When I last saw him in July he was having some unusual types of chest pains.  He was able to exercise without too much difficulty.  He did not want have a heart catheterization but wanted to be reevaluated in several months and returns today for further evaluation.  Still has some CP if he "rushes" too much   Has some angina - not as bad as his previous CP.  Imdur seems to help   March 02, 2020: Anthony Carpenter is seen today for follow-up of his coronary artery disease.  He has had some unusual type of chest pains in the past but he did not want to have a heart catheterization. Blood pressure and heart rate remain well controlled. Can walk for abut a mile ,   Gets out of breath if he rushes too much .  Has not had to take a NTG .   Oct. 14, 2021: Anthony Carpenter is seen today for follow up of his CAD, hyperlipidemia No CP. 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 Wants to try Sildenafil  Will DC his Imdur   Feb. 17, 2023 Anthony Carpenter is seen today for follow up of his CAD He has been having more angina like chest pain recently  Added Imdur - which has helped    Oct. 16,,2023  Anthony Carpenter is seen for follow up of his CAD Was having symptoms of UAP when I saw him Repeat cath feb. 2023 showed widely patent stents. No CP, does have some DOE   Will check labs - CBC, BMP, lipids, LFTs Will get an echo - his last echo from 2018 showed mild AS ( mean AV gradient of 9 mmHg)    Feb. 19, 2024 Anthony Carpenter is seen today for follow up of his CAD , mild AS  Has chronic shortness of breath with exercise Walks without any difficulty  He is in atrial fib today .  Cannot tell that his HR is  irreg.  This Afib may explain his dyspnea        Past Medical History:  Diagnosis Date   Aortic stenosis, mild    Bicuspid aortic valve    Bradycardia    Carotid bruit present    a. Right side   Coronary artery disease    a. CAD s/p PTCA/Taxus stent to mid-prox LAD 2006  b.  cath 10/02/14 with 60% focal instent restenosis in LAD prox to the Diag takeoff, 30% mid AV groove left circ stenosis and luminal irregularities in a large dominant RCA. FFR of LAD was 0.85 and not felt to be HD significant to warrant PCI.    Glaucoma    Hyperlipidemia    Hypertension    Myocardial infarction [facility]) 2006   Pulmonary nodule seen on imaging study    a. seen on CT during 09/2014 admission, may require follow up imaging     Past Surgical History:  Procedure Laterality Date   CARDIAC CATHETERIZATION  07/28/2005   Est EF of 55% -- Single-vessel obstructive disease in the proximal/mid left anterior descending -- Preserved left ventricular systolic function with good anterior wall motion   cataracts  5-6 years ago   COLONOSCOPY  ? 2009   CORONARY ANGIOPLASTY WITH STENT PLACEMENT  07/28/2005   Successful PTCA and stenting of the mid and proximal left anterior descending artery.  The patient is stable leaving the catheterization laboratory -- Thayer Headings, M.D.     CORONARY STENT PLACEMENT  02/11/2015   3.0 x 16 mm Promus Premier DES to the mLAD for ISR   LEFT HEART CATH AND CORONARY ANGIOGRAPHY N/A 01/10/2022   Procedure: LEFT HEART CATH AND CORONARY ANGIOGRAPHY;  Surgeon: Leonie Man, MD;  Location: [facility];  Service: Cardiovascular;  Laterality: N/A;   LEFT HEART CATHETERIZATION WITH CORONARY ANGIOGRAM N/A 10/02/2014   Procedure: LEFT HEART CATHETERIZATION WITH CORONARY ANGIOGRAM;  Surgeon: Troy Sine, MD;  Location: [facility] CATH LAB;  Service: Cardiovascular;  Laterality: N/A;   LEFT HEART CATHETERIZATION WITH CORONARY ANGIOGRAM N/A 02/11/2015   Procedure: LEFT HEART  CATHETERIZATION WITH CORONARY ANGIOGRAM;  Surgeon: Burnell Blanks, MD; LAD 99% ISR, CFX okay, OM1 50%, RCA 30%, D1 30% (jailed by the LAD stent), EF 45-50 %   THULIUM LASER TURP (TRANSURETHRAL RESECTION OF PROSTATE) N/A 08/06/2018   Procedure: THULIUM LASER TURP (TRANSURETHRAL RESECTION OF PROSTATE);  Surgeon: Festus Aloe, MD;  Location: [facility] ORS;  Service: Urology;  Laterality: N/A;     Current Outpatient Medications  Medication Sig Dispense Refill   apixaban (ELIQUIS) 5 MG TABS tablet Take 1 tablet (5 mg total) by mouth 2 (two) times daily. 180 tablet 3   atorvastatin (LIPITOR) 80 MG tablet TAKE 1 TABLET BY MOUTH DAILY AT  6 PM 90 tablet 3   dorzolamide-timolol (COSOPT) 22.3-6.8 MG/ML ophthalmic solution Place 1 drop into both eyes 2 (two) times daily.      isosorbide mononitrate (IMDUR) 30 MG 24 hr tablet Take 1 tablet (30 mg total) by mouth daily. 90 tablet 3   latanoprost (XALATAN) 0.005 % ophthalmic solution Place 1 drop into both eyes at bedtime.      lisinopril (ZESTRIL) 5 MG tablet Take 1 tablet (5 mg total) by mouth daily. 90 tablet 3   Multiple Vitamins-Minerals (ICAPS AREDS FORMULA PO) Take 1 tablet by mouth 2 (two) times daily.      nitroGLYCERIN (NITROSTAT) 0.4 MG SL tablet Place 1 tablet (  0.4 mg total) under the tongue every 5 (five) minutes as needed. For chest pain. 25 tablet 12   No current facility-administered medications for this visit.    Allergies:   Patient has no known allergies.    Social History:  The patient  reports that he has never smoked. He has never used smokeless tobacco. He reports that he does not drink alcohol and does not use drugs.   Family History:  The patient's family history includes Cancer in an other family member; Lung cancer in his father.    ROS:   Noted in current history, otherwise review of systems is negative.   Physical Exam: Blood pressure 126/66, pulse (!) 55, height 5' 10"$  (1.778 m), weight 182 lb 3.2 oz (82.6 kg),  SpO2 99 %.       GEN:  Well nourished, well developed in no acute distress HEENT: Normal NECK: No JVD; No carotid bruits LYMPHATICS: No lymphadenopathy CARDIAC: irreg. Irreg.   RESPIRATORY:  Clear to auscultation without rales, wheezing or rhonchi  ABDOMEN: Soft, non-tender, non-distended MUSCULOSKELETAL:  No edema; No deformity  SKIN: Warm and dry NEUROLOGIC:  Alert and oriented x 3     EKG:   Feb. 19, 2024:   Atrial fib with slow V response.   NS T wave abn.    Recent Labs: 03/06/2022: TSH 1.400 09/04/2022: ALT 14; BUN 16; Creatinine, Ser 0.97; Hemoglobin 14.1; Platelets 154; Potassium 4.0; Sodium 143    Lipid Panel    Component Value Date/Time   CHOL 106 09/04/2022 0831   TRIG 71 09/04/2022 0831   HDL 38 (L) 09/04/2022 0831   CHOLHDL 2.8 09/04/2022 0831   CHOLHDL 2.8 09/11/2016 0904   VLDL 14 09/11/2016 0904   LDLCALC 53 09/04/2022 0831      Wt Readings from Last 3 Encounters:  01/08/23 182 lb 3.2 oz (82.6 kg)  09/04/22 183 lb 3.2 oz (83.1 kg)  03/06/22 191 lb 6.4 oz (86.8 kg)      Other studies Reviewed: Additional studies/ records that were reviewed today include: . Review of the above records demonstrates:    ASSESSMENT AND PLAN:   1. Coronary artery disease-   no angina .  Marland Kitchen  2. Atrial fib:   has newly diagnosed Afib  CHADS2VASC is at least 3    ( age 16, CAD, )  DC plavix Start Eliquis 5 mg Po BID  Office visit in 1 month with me or M. Swinyer, NP to arrange cardioversion.      3.  Generalized fatigue:            Current medicines are reviewed at length with the patient today.  The patient does not have concerns regarding medicines.  The following changes have been made:  no change  Labs/ tests ordered today include:   Orders Placed This Encounter  Procedures   TSH   Basic metabolic panel   CBC   EKG 12-Lead      Disposition:       Signed, Mertie Moores, MD  01/08/2023 8:34 AM    [facility]  [facility], [place], [place]  [address] Phone: [phone]; Fax: [phone]

## 2023-01-08 ENCOUNTER — Encounter: Payer: Self-pay | Admitting: Cardiovascular Disease

## 2023-01-08 ENCOUNTER — Ambulatory Visit: Payer: Medicare Other | Attending: Cardiovascular Disease | Admitting: Cardiovascular Disease

## 2023-01-08 ENCOUNTER — Telehealth: Payer: Self-pay | Admitting: Cardiovascular Disease

## 2023-01-08 VITALS — BP 126/66 | HR 55 | Ht 70.0 in | Wt 182.2 lb

## 2023-01-08 DIAGNOSIS — E782 Mixed hyperlipidemia: Secondary | ICD-10-CM

## 2023-01-08 DIAGNOSIS — I4891 Unspecified atrial fibrillation: Secondary | ICD-10-CM

## 2023-01-08 DIAGNOSIS — I25119 Atherosclerotic heart disease of native coronary artery with unspecified angina pectoris: Secondary | ICD-10-CM | POA: Diagnosis not present

## 2023-01-08 LAB — TSH: TSH: 1.45 u[IU]/mL (ref 0.450–4.500)

## 2023-01-08 MED ORDER — APIXABAN 5 MG PO TABS: 5.0000 mg | ORAL_TABLET | Freq: Two times a day (BID) | ORAL | 3 refills | Status: DC

## 2023-01-08 NOTE — Telephone Encounter (Signed)
Pt c/o medication issue:  1. Name of Medication:   2. How are you currently taking this medication (dosage and times per day)?   3. Are you having a reaction (difficulty breathing--STAT)?   4. What is your medication issue?   Patient would like to clarify which medication Dr. Acie Fredrickson would like for him to discontinue.

## 2023-01-08 NOTE — Patient Instructions (Signed)
Medication Instructions:  Your physician has recommended you make the following change in your medication:  1.stop Plavix 2.start Eliquis 86m 2 times daily  *If you need a refill on your cardiac medications before your next appointment, please call your pharmacy*   Lab Work: TSH today BMET and CBC 4 weeks If you have labs (blood work) drawn today and your tests are completely normal, you will receive your results only by: [place](if you have MyChart) OR A paper copy in the mail If you have any lab test that is abnormal or we need to change your treatment, we will call you to review the results.   Follow-Up: At [facility] you and your health needs are our priority.  As part of our continuing mission to provide you with exceptional heart care, we have created designated Provider Care Teams.  These Care Teams include your primary Cardiologist (physician) and Advanced Practice Providers (APPs -  Physician Assistants and Nurse Practitioners) who all work together to provide you with the care you need, when you need it.  We recommend signing up for the patient portal called "MyChart".  Sign up information is provided on this After Visit Summary.  MyChart is used to connect with patients for Virtual Visits (Telemedicine).  Patients are able to view lab/test results, encounter notes, upcoming appointments, etc.  Non-urgent messages can be sent to your provider as well.   To learn more about what you can do with MyChart, go to [email or web address]    Your next appointment:   4 week(s)  Provider:   PMertie Moores MD  or MChristen Bame

## 2023-01-08 NOTE — Telephone Encounter (Signed)
Patient is calling back for update. Please advise

## 2023-01-08 NOTE — Telephone Encounter (Signed)
Called pt advised per MD 01/08/23  stop clopidogrel/ Plavix.  Pt had no further questions or concerns.

## 2023-01-21 ENCOUNTER — Other Ambulatory Visit: Payer: Self-pay | Admitting: Cardiovascular Disease

## 2023-02-04 ENCOUNTER — Encounter: Payer: Self-pay | Admitting: Cardiovascular Disease

## 2023-02-04 NOTE — Progress Notes (Signed)
Cardiology Office Note   Date:  02/05/2023   ID:  Anthony Carpenter, DOB [birth year], MRN [id number]  PCP:  Orpah Melter, MD  Cardiologist:   Mertie Moores, MD   Chief Complaint  Patient presents with   Coronary Artery Disease        Atrial Fibrillation         1. Coronary artery disease-status post PTCA and stenting of his mid LAD.  ( February 11, 2015)    We placed a 2.75 x 20 mm Taxus stent. It was post dilated using a 3.0 Quantum Maverick, 07/28/05  2. Hyperlipidemia-    3. Possible bicuspid aortic valve   Anthony Carpenter  is doing well.  No chest pain or dyspnea.  He plans on putting in a garden this summer .  He's building his house this year.  He was admitted to the hospital with some chest pain/abdominal pain. A Myoview study did not reveal any ischemia.  He has felt better.  No further episodes of chest pain.  He forgot to take his meds this am.  Feb. 26, 2014: Anthony Carpenter  is doing well.  He has not had any angina.  He remains active - still building houses.   July 14, 2013:  Anthony Carpenter is doing  Well.  No angina.    July 14, 2014:  Anthony Carpenter is doing well.  Still working   No CP, no dyspnea.    Dec. 11, 2015;  Anthony Carpenter was in the hospital for CP recently.  He was having vague chest discomfort.   Cath showed a 60 % instent restenosis.   Pains have resolved.    He is back doing all of his normal activities.  BP has been lower     February 05, 2015:  Anthony Carpenter is a 82 y.o. male who presents for some CP and DOE for the past month. Occurs when he is working outside.   Mowing, speading fertilizer.   Will last as long as he's working . Eases up if he stops.   March 17, 2015:  Anthony Carpenter is a 82 y.o. male who presents for follow up of an episode of unstable angina.  I saw him in March for symptoms worisome of UAP.  CAth showed.   Left main: No obstructive disease.    Left Anterior Descending Artery: Large caliber vessel that courses to the apex. The mid segment is stented. The old  stent has 99% restenosis. The distal vessel is free of obstructive disease. The Diagonal branch is moderate in caliber with ostial 30% stenosis where the vessel is jailed by the LAD stent.   Circumflex Artery: Large caliber vessel with moderate caliber obtuse marginal branch. The obtuse marginal branch has proximal 50% stenosis.   Right Coronary Artery: Large dominant vessel with 30% mid stenosis.  Left Ventricular Angiogram: LVEF=45-50% with hypokinesis of the anterior wall and apex.  He had 3.0 x 16 mm Promus Premier DES in the mid LAD. The stent was post-dilated with a 3.25 x 12 mm [place] balloon x 1. The stenosis was taken from 99% down to 0%.   He is feeling much better.   Not shortness of breath   Participating in cardiac rehab. Bruising from the plavix.   But no real PC   Oct. 24, 2016:  Anthony Carpenter is doing well. Walking regularly  Has several projects doing on. Developing some land  Has some bruising and occasional nose bleeds.   March 13, 2016:  Staying active.  Occasional nose bleeds   Oct. 23, 2017:  Doing well .  No CP or dyspnea. Cooked 80 gallons of [place] stew this weekend  Has occasional nose bleed   Mar 28, 2017:  Doing well.   No angina Working on his housing development   October 04, 2017:  Doing well .   Has lots of bruising but not so much nosebleeding. We have stopped the ASA and continued the plavix.    May 20, 2018: Anthony Carpenter is seen today for follow-up of his coronary artery disease and hyperlipidemia No CP ,  No dyspnea.   Still bruising.   Is still on Plavix,   Is off ASA  No recent nose bleeds  Jan. 6, 2020 Has mild DOE if he exrcises vigorously No return of the angina he had last year  June 02, 2019 :  Has CAD.   Was having angina,  Started Imdur.  Pain significantly improved. Not able to do as much as he thinks he would do .  Walks 1/2 mile without any problems.  Can climb 2 flights of stairs.   Has not needed NTG recently   September 04, 2019:  Anthony Carpenter is seen today for a follow-up visit.  He has a history of coronary artery disease.  When I last saw him in July he was having some unusual types of chest pains.  He was able to exercise without too much difficulty.  He did not want have a heart catheterization but wanted to be reevaluated in several months and returns today for further evaluation.  Still has some CP if he "rushes" too much   Has some angina - not as bad as his previous CP.  Imdur seems to help   March 02, 2020: Anthony Carpenter is seen today for follow-up of his coronary artery disease.  He has had some unusual type of chest pains in the past but he did not want to have a heart catheterization. Blood pressure and heart rate remain well controlled. Can walk for abut a mile ,   Gets out of breath if he rushes too much .  Has not had to take a NTG .   Oct. 14, 2021: Anthony Carpenter is seen today for follow up of his CAD, hyperlipidemia No CP. 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 Wants to try Sildenafil  Will DC his Imdur   Feb. 17, 2023 Anthony Carpenter is seen today for follow up of his CAD He has been having more angina like chest pain recently  Added Imdur - which has helped    Oct. 16,,2023  Anthony Carpenter is seen for follow up of his CAD Was having symptoms of UAP when I saw him Repeat cath feb. 2023 showed widely patent stents. No CP, does have some DOE   Will check labs - CBC, BMP, lipids, LFTs Will get an echo - his last echo from 2018 showed mild AS ( mean AV gradient of 9 mmHg)    Feb. 19, 2024 Anthony Carpenter is seen today for follow up of his CAD , mild AS  Has chronic shortness of breath with exercise Walks without any difficulty  He is in atrial fib  today . Cannot tell that his HR is irreg.  This Afib may explain his dyspnea    February 05, 2023 Anthony Carpenter is seen for follow up visit for his CAD, mild AS Newly diagnosed Afib He is seen for follow up of his atrial fib and to schedule cardioversion if he remains in atrial fib      Past Medical History:  Diagnosis Date   Aortic stenosis, mild    Bicuspid aortic valve    Bradycardia    Carotid bruit present    a. Right side   Coronary artery disease    a. CAD s/p PTCA/Taxus stent to mid-prox LAD 2006  b.  cath 10/02/14 with 60% focal instent restenosis in LAD prox to the Diag takeoff, 30% mid AV groove left circ stenosis and luminal irregularities in a large dominant RCA. FFR of LAD was 0.85 and not felt to be HD significant to warrant PCI.    Glaucoma    Hyperlipidemia    Hypertension    Myocardial infarction [facility]) 2006   Pulmonary nodule seen on imaging study    a. seen on CT during 09/2014 admission, may require follow up imaging     Past Surgical History:  Procedure Laterality Date   CARDIAC CATHETERIZATION  07/28/2005   Est EF of 55% -- Single-vessel obstructive disease in the proximal/mid left anterior descending -- Preserved left ventricular systolic function with good anterior wall motion   cataracts  5-6 years ago   COLONOSCOPY  ? 2009   CORONARY ANGIOPLASTY WITH STENT PLACEMENT  07/28/2005   Successful PTCA and stenting of the mid and proximal left anterior descending artery.  The patient is stable leaving the catheterization laboratory -- Thayer Headings, M.D.     CORONARY STENT PLACEMENT  02/11/2015   3.0 x 16 mm Promus Premier DES to the mLAD for ISR   LEFT HEART CATH AND CORONARY ANGIOGRAPHY N/A 01/10/2022   Procedure: LEFT HEART CATH AND CORONARY ANGIOGRAPHY;  Surgeon: Leonie Man, MD;  Location: [facility];  Service: Cardiovascular;  Laterality: N/A;   LEFT HEART CATHETERIZATION WITH CORONARY ANGIOGRAM N/A 10/02/2014   Procedure: LEFT HEART  CATHETERIZATION WITH CORONARY ANGIOGRAM;  Surgeon: Troy Sine, MD;  Location: [facility] CATH LAB;  Service: Cardiovascular;  Laterality: N/A;   LEFT HEART CATHETERIZATION WITH CORONARY ANGIOGRAM N/A 02/11/2015   Procedure: LEFT HEART CATHETERIZATION WITH CORONARY ANGIOGRAM;  Surgeon: Burnell Blanks, MD; LAD 99% ISR, CFX okay, OM1 50%, RCA 30%, D1 30% (jailed by the LAD stent), EF 45-50 %   THULIUM LASER TURP (TRANSURETHRAL RESECTION OF PROSTATE) N/A 08/06/2018   Procedure: THULIUM LASER TURP (TRANSURETHRAL RESECTION OF PROSTATE);  Surgeon: Festus Aloe, MD;  Location: [facility];  Service: Urology;  Laterality: N/A;     Current Outpatient Medications  Medication Sig Dispense Refill   albuterol (VENTOLIN HFA) 108 (90 Base) MCG/ACT inhaler Inhale 2 puffs into the lungs every 6 (six) hours as needed for wheezing or shortness of breath. 8 g 2   atorvastatin (LIPITOR) 80 MG tablet TAKE 1 TABLET BY MOUTH DAILY AT  6 PM 90 tablet 3   azithromycin (ZITHROMAX) 250 MG tablet Take 250 mg by mouth as directed.     dorzolamide-timolol (COSOPT) 22.3-6.8 MG/ML ophthalmic solution Place 1 drop into both eyes 2 (two) times daily.      lisinopril (ZESTRIL) 5 MG tablet Take 1 tablet (5 mg total) by mouth daily. 90 tablet 3  Multiple Vitamins-Minerals (ICAPS AREDS FORMULA PO) Take 1 tablet by mouth 2 (two) times daily.      nitroGLYCERIN (NITROSTAT) 0.4 MG SL tablet Place 1 tablet (0.4 mg total) under the tongue every 5 (five) minutes as needed. For chest pain. 25 tablet 12   predniSONE (DELTASONE) 10 MG tablet Take by mouth.     apixaban (ELIQUIS) 5 MG TABS tablet Take 1 tablet (5 mg total) by mouth 2 (two) times daily. 180 tablet 3   latanoprost (XALATAN) 0.005 % ophthalmic solution Place 1 drop into both eyes at bedtime.  (Patient not taking: Reported on 02/05/2023)     No current facility-administered medications for this visit.    Allergies:   Patient has no known allergies.    Social History:  The  patient  reports that he has never smoked. He has never used smokeless tobacco. He reports that he does not drink alcohol and does not use drugs.   Family History:  The patient's family history includes Cancer in an other family member; Lung cancer in his father.    ROS:   Noted in current history, otherwise review of systems is negative.   Physical Exam: Blood pressure 124/70, pulse (!) 57, height 5\' 10"  (1.778 m), weight 181 lb 12.8 oz (82.5 kg), peak flow 98 L/min.       GEN:  Well nourished, well developed in no acute distress HEENT: Normal NECK: No JVD; No carotid bruits LYMPHATICS: No lymphadenopathy CARDIAC: irreg irreg.    RESPIRATORY:  basilar rhonchi, congestion .  ABDOMEN: Soft, non-tender, non-distended MUSCULOSKELETAL:  No edema; No deformity  SKIN: Warm and dry NEUROLOGIC:  Alert and oriented x 3    EKG:    February 05, 2023: Atrial fibrillation with a controlled ventricular response.  Heart rate is 62.  Poor R wave progression.   Recent Labs: 09/04/2022: ALT 14; BUN 16; Creatinine, Ser 0.97; Hemoglobin 14.1; Platelets 154; Potassium 4.0; Sodium 143 01/08/2023: TSH 1.450    Lipid Panel    Component Value Date/Time   CHOL 106 09/04/2022 0831   TRIG 71 09/04/2022 0831   HDL 38 (L) 09/04/2022 0831   CHOLHDL 2.8 09/04/2022 0831   CHOLHDL 2.8 09/11/2016 0904   VLDL 14 09/11/2016 0904   LDLCALC 53 09/04/2022 0831      Wt Readings from Last 3 Encounters:  02/05/23 181 lb 12.8 oz (82.5 kg)  01/08/23 182 lb 3.2 oz (82.6 kg)  09/04/22 183 lb 3.2 oz (83.1 kg)      Other studies Reviewed: Additional studies/ records that were reviewed today include: . Review of the above records demonstrates:    ASSESSMENT AND PLAN:   1. Coronary artery disease-     .  2. Atrial fib:   has newly diagnosed Afib  CHADS2VASC is at least 38    ( age 43, CAD, ) He has not missed any recent doses of Eliquis.  Will schedule him for cardioversion.  We discussed the risk,  benefits, options of cardioversion.  He understands and agrees to proceed.  Scheduled with Dr. Johney Frame     3.  Generalized fatigue:      4.  Upper respiratory tori tract infection: He has URI symptoms.  He has some congestion in his lungs.  Will send in a prescription for albuterol HFA.   Will see him again in 3 months.       Current medicines are reviewed at length with the patient today.  The patient does not have concerns  regarding medicines.  The following changes have been made:  no change  Labs/ tests ordered today include:   Orders Placed This Encounter  Procedures   EKG 12-Lead      Disposition:       Signed, Mertie Moores, MD  02/05/2023 11:08 AM    [facility] [address], [place], [place]  [address] Phone: [phone]; Fax: [phone]

## 2023-02-04 NOTE — H&P (View-Only) (Signed)
 Cardiology Office Note   Date:  02/05/2023   ID:  [name], DOB 01/01/1941, MRN [id number]  PCP:  Meyers, Stephen, MD  Cardiologist:   Damar Petit, MD   Chief Complaint  Patient presents with   Coronary Artery Disease        Atrial Fibrillation         1. Coronary artery disease-status post PTCA and stenting of his mid LAD.  ( February 11, 2015)    We placed a 2.75 x 20 mm Taxus stent. It was post dilated using a 3.0 Quantum Maverick, 07/28/05  2. Hyperlipidemia-    3. Possible bicuspid aortic valve   Anthony Carpenter  is doing well.  No chest pain or dyspnea.  He plans on putting in a garden this summer .  He's building his house this year.  He was admitted to the hospital with some chest pain/abdominal pain. A Myoview study did not reveal any ischemia.  He has felt better.  No further episodes of chest pain.  He forgot to take his meds this am.  Feb. 26, 2014: Anthony Carpenter  is doing well.  He has not had any angina.  He remains active - still building houses.   July 14, 2013:  Anthony Carpenter is doing  Well.  No angina.    July 14, 2014:  Anthony Carpenter is doing well.  Still working   No CP, no dyspnea.    Dec. 11, 2015;  Anthony Carpenter was in the hospital for CP recently.  He was having vague chest discomfort.   Cath showed a 60 % instent restenosis.   Pains have resolved.    He is back doing all of his normal activities.  BP has been lower     February 05, 2015:  Anthony Carpenter is a 82 y.o. male who presents for some CP and DOE for the past month. Occurs when he is working outside.   Mowing, speading fertilizer.   Will last as long as he's working . Eases up if he stops.   March 17, 2015:  Anthony Carpenter is a 81 y.o. male who presents for follow up of an episode of unstable angina.  I saw him in March for symptoms worisome of UAP.  CAth showed.   Left main: No obstructive disease.    Left Anterior Descending Artery: Large caliber vessel that courses to the apex. The mid segment is stented. The old  stent has 99% restenosis. The distal vessel is free of obstructive disease. The Diagonal branch is moderate in caliber with ostial 30% stenosis where the vessel is jailed by the LAD stent.   Circumflex Artery: Large caliber vessel with moderate caliber obtuse marginal branch. The obtuse marginal branch has proximal 50% stenosis.   Right Coronary Artery: Large dominant vessel with 30% mid stenosis.  Left Ventricular Angiogram: LVEF=45-50% with hypokinesis of the anterior wall and apex.  He had 3.0 x 16 mm Promus Premier DES in the mid LAD. The stent was post-dilated with a 3.25 x 12 mm London balloon x 1. The stenosis was taken from 99% down to 0%.   He is feeling much better.   Not shortness of breath   Participating in cardiac rehab. Bruising from the plavix.   But no real PC   Oct. 24, 2016:  Anthony Carpenter is doing well. Walking regularly  Has several projects doing on. Developing some land  Has some bruising and occasional nose bleeds.   March 13, 2016:  Staying active.      Occasional nose bleeds   Oct. 23, 2017:  Doing well .  No CP or dyspnea. Cooked 80 gallons of [place] stew this weekend  Has occasional nose bleed   Mar 28, 2017:  Doing well.   No angina Working on his housing development   October 04, 2017:  Doing well .   Has lots of bruising but not so much nosebleeding. We have stopped the ASA and continued the plavix.    May 20, 2018: Anthony Carpenter is seen today for follow-up of his coronary artery disease and hyperlipidemia No CP ,  No dyspnea.   Still bruising.   Is still on Plavix,   Is off ASA  No recent nose bleeds  Jan. 6, 2020 Has mild DOE if he exrcises vigorously No return of the angina he had last year  June 02, 2019 :  Has CAD.   Was having angina,  Started Imdur.  Pain significantly improved. Not able to do as much as he thinks he would do .  Walks 1/2 mile without any problems.  Can climb 2 flights of stairs.   Has not needed NTG recently   September 04, 2019:  Anthony Carpenter is seen today for a follow-up visit.  He has a history of coronary artery disease.  When I last saw him in July he was having some unusual types of chest pains.  He was able to exercise without too much difficulty.  He did not want have a heart catheterization but wanted to be reevaluated in several months and returns today for further evaluation.  Still has some CP if he "rushes" too much   Has some angina - not as bad as his previous CP.  Imdur seems to help   March 02, 2020: Anthony Carpenter is seen today for follow-up of his coronary artery disease.  He has had some unusual type of chest pains in the past but he did not want to have a heart catheterization. Blood pressure and heart rate remain well controlled. Can walk for abut a mile ,   Gets out of breath if he rushes too much .  Has not had to take a NTG .   Oct. 14, 2021: Anthony Carpenter is seen today for follow up of his CAD, hyperlipidemia No CP. 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 Wants to try Sildenafil  Will DC his Imdur   Feb. 17, 2023 Anthony Carpenter is seen today for follow up of his CAD He has been having more angina like chest pain recently  Added Imdur - which has helped    Oct. 16,,2023  Anthony Carpenter is seen for follow up of his CAD Was having symptoms of UAP when I saw him Repeat cath feb. 2023 showed widely patent stents. No CP, does have some DOE   Will check labs - CBC, BMP, lipids, LFTs Will get an echo - his last echo from 2018 showed mild AS ( mean AV gradient of 9 mmHg)    Feb. 19, 2024 Anthony Carpenter is seen today for follow up of his CAD , mild AS  Has chronic shortness of breath with exercise Walks without any difficulty    He is in atrial fib  today . Cannot tell that his HR is irreg.  This Afib may explain his dyspnea    February 05, 2023 Anthony Carpenter is seen for follow up visit for his CAD, mild AS Newly diagnosed Afib He is seen for follow up of his atrial fib and to schedule cardioversion if he remains in atrial fib      Past Medical History:  Diagnosis Date   Aortic stenosis, mild    Bicuspid aortic valve    Bradycardia    Carotid bruit present    a. Right side   Coronary artery disease    a. CAD s/p PTCA/Taxus stent to mid-prox LAD 2006  b.  cath 10/02/14 with 60% focal instent restenosis in LAD prox to the Diag takeoff, 30% mid AV groove left circ stenosis and luminal irregularities in a large dominant RCA. FFR of LAD was 0.85 and not felt to be HD significant to warrant PCI.    Glaucoma    Hyperlipidemia    Hypertension    Myocardial infarction (HCC) 2006   Pulmonary nodule seen on imaging study    a. seen on CT during 09/2014 admission, may require follow up imaging     Past Surgical History:  Procedure Laterality Date   CARDIAC CATHETERIZATION  07/28/2005   Est EF of 55% -- Single-vessel obstructive disease in the proximal/mid left anterior descending -- Preserved left ventricular systolic function with good anterior wall motion   cataracts  5-6 years ago   COLONOSCOPY  ? 2009   CORONARY ANGIOPLASTY WITH STENT PLACEMENT  07/28/2005   Successful PTCA and stenting of the mid and proximal left anterior descending artery.  The patient is stable leaving the catheterization laboratory -- Anatasia Tino J. Chanie Soucek, M.D.     CORONARY STENT PLACEMENT  02/11/2015   3.0 x 16 mm Promus Premier DES to the mLAD for ISR   LEFT HEART CATH AND CORONARY ANGIOGRAPHY N/A 01/10/2022   Procedure: LEFT HEART CATH AND CORONARY ANGIOGRAPHY;  Surgeon: Harding, David W, MD;  Location: [facility];  Service: Cardiovascular;  Laterality: N/A;   LEFT HEART CATHETERIZATION WITH CORONARY ANGIOGRAM N/A 10/02/2014   Procedure: LEFT HEART  CATHETERIZATION WITH CORONARY ANGIOGRAM;  Surgeon: Thomas A Kelly, MD;  Location: [facility] CATH [facility];  Service: Cardiovascular;  Laterality: N/A;   LEFT HEART CATHETERIZATION WITH CORONARY ANGIOGRAM N/A 02/11/2015   Procedure: LEFT HEART CATHETERIZATION WITH CORONARY ANGIOGRAM;  Surgeon: Christopher D McAlhany, MD; LAD 99% ISR, CFX okay, OM1 50%, RCA 30%, D1 30% (jailed by the LAD stent), EF 45-50 %   THULIUM LASER TURP (TRANSURETHRAL RESECTION OF PROSTATE) N/A 08/06/2018   Procedure: THULIUM LASER TURP (TRANSURETHRAL RESECTION OF PROSTATE);  Surgeon: Eskridge, Matthew, MD;  Location: [facility];  Service: Urology;  Laterality: N/A;     Current Outpatient Medications  Medication Sig Dispense Refill   albuterol (VENTOLIN HFA) 108 (90 Base) MCG/ACT inhaler Inhale 2 puffs into the lungs every 6 (six) hours as needed for wheezing or shortness of breath. 8 g 2   atorvastatin (LIPITOR) 80 MG tablet TAKE 1 TABLET BY MOUTH DAILY AT  6 PM 90 tablet 3   azithromycin (ZITHROMAX) 250 MG tablet Take 250 mg by mouth as directed.     dorzolamide-timolol (COSOPT) 22.3-6.8 MG/ML ophthalmic solution Place 1 drop into both eyes 2 (two) times daily.      lisinopril (ZESTRIL) 5 MG tablet Take 1 tablet (5 mg total) by mouth daily. 90 tablet 3     Multiple Vitamins-Minerals (ICAPS AREDS FORMULA PO) Take 1 tablet by mouth 2 (two) times daily.      nitroGLYCERIN (NITROSTAT) 0.4 MG SL tablet Place 1 tablet (0.4 mg total) under the tongue every 5 (five) minutes as needed. For chest pain. 25 tablet 12   predniSONE (DELTASONE) 10 MG tablet Take by mouth.     apixaban (ELIQUIS) 5 MG TABS tablet Take 1 tablet (5 mg total) by mouth 2 (two) times daily. 180 tablet 3   latanoprost (XALATAN) 0.005 % ophthalmic solution Place 1 drop into both eyes at bedtime.  (Patient not taking: Reported on 02/05/2023)     No current facility-administered medications for this visit.    Allergies:   Patient has no known allergies.    Social History:  The  patient  reports that he has never smoked. He has never used smokeless tobacco. He reports that he does not drink alcohol and does not use drugs.   Family History:  The patient's family history includes Cancer in an other family member; Lung cancer in his father.    ROS:   Noted in current history, otherwise review of systems is negative.   Physical Exam: Blood pressure 124/70, pulse (!) 57, height 5' 10" (1.778 m), weight 181 lb 12.8 oz (82.5 kg), peak flow 98 L/min.       GEN:  Well nourished, well developed in no acute distress HEENT: Normal NECK: No JVD; No carotid bruits LYMPHATICS: No lymphadenopathy CARDIAC: irreg irreg.    RESPIRATORY:  basilar rhonchi, congestion .  ABDOMEN: Soft, non-tender, non-distended MUSCULOSKELETAL:  No edema; No deformity  SKIN: Warm and dry NEUROLOGIC:  Alert and oriented x 3    EKG:    February 05, 2023: Atrial fibrillation with a controlled ventricular response.  Heart rate is 62.  Poor R wave progression.   Recent Labs: 09/04/2022: ALT 14; BUN 16; Creatinine, Ser 0.97; Hemoglobin 14.1; Platelets 154; Potassium 4.0; Sodium 143 01/08/2023: TSH 1.450    Lipid Panel    Component Value Date/Time   CHOL 106 09/04/2022 0831   TRIG 71 09/04/2022 0831   HDL 38 (L) 09/04/2022 0831   CHOLHDL 2.8 09/04/2022 0831   CHOLHDL 2.8 09/11/2016 0904   VLDL 14 09/11/2016 0904   LDLCALC 53 09/04/2022 0831      Wt Readings from Last 3 Encounters:  02/05/23 181 lb 12.8 oz (82.5 kg)  01/08/23 182 lb 3.2 oz (82.6 kg)  09/04/22 183 lb 3.2 oz (83.1 kg)      Other studies Reviewed: Additional studies/ records that were reviewed today include: . Review of the above records demonstrates:    ASSESSMENT AND PLAN:   1. Coronary artery disease-     .  2. Atrial fib:   has newly diagnosed Afib  CHADS2VASC is at least 3    ( age 81, CAD, ) He has not missed any recent doses of Eliquis.  Will schedule him for cardioversion.  We discussed the risk,  benefits, options of cardioversion.  He understands and agrees to proceed.  Scheduled with Dr. Pemberton     3.  Generalized fatigue:      4.  Upper respiratory tori tract infection: He has URI symptoms.  He has some congestion in his lungs.  Will send in a prescription for albuterol HFA.   Will see him again in 3 months.       Current medicines are reviewed at length with the patient today.  The patient does not have concerns   regarding medicines.  The following changes have been made:  no change  Labs/ tests ordered today include:   Orders Placed This Encounter  Procedures   EKG 12-Lead      Disposition:       Signed, Jenia Klepper, MD  02/05/2023 11:08 AM    [facility] [address], [place], [place]  [address] Phone: [phone]; Fax: [phone]  

## 2023-02-05 ENCOUNTER — Ambulatory Visit: Payer: Medicare Other | Attending: Cardiovascular Disease | Admitting: Cardiovascular Disease

## 2023-02-05 ENCOUNTER — Ambulatory Visit: Payer: Medicare Other

## 2023-02-05 VITALS — BP 124/70 | HR 57 | Ht 70.0 in | Wt 181.8 lb

## 2023-02-05 DIAGNOSIS — I4891 Unspecified atrial fibrillation: Secondary | ICD-10-CM | POA: Diagnosis not present

## 2023-02-05 DIAGNOSIS — I25119 Atherosclerotic heart disease of native coronary artery with unspecified angina pectoris: Secondary | ICD-10-CM

## 2023-02-05 DIAGNOSIS — E782 Mixed hyperlipidemia: Secondary | ICD-10-CM

## 2023-02-05 MED ORDER — ALBUTEROL SULFATE HFA 108 (90 BASE) MCG/ACT IN AERS: 2.0000 | INHALATION_SPRAY | Freq: Four times a day (QID) | RESPIRATORY_TRACT | 2 refills | Status: DC | PRN

## 2023-02-05 MED ORDER — APIXABAN 5 MG PO TABS: 5.0000 mg | ORAL_TABLET | Freq: Two times a day (BID) | ORAL | 3 refills | Status: DC

## 2023-02-05 NOTE — Patient Instructions (Addendum)
Medication Instructions:  START albuterol inhaler as needed  *If you need a refill on your cardiac medications before your next appointment, please call your pharmacy*   Testing/Procedures: Cardioversion Your physician has recommended that you have a Cardioversion (DCCV). Electrical Cardioversion uses a jolt of electricity to your heart either through paddles or wired patches attached to your chest. This is a controlled, usually prescheduled, procedure. Defibrillation is done under light anesthesia in the hospital, and you usually go home the day of the procedure. This is done to get your heart back into a normal rhythm. You are not awake for the procedure. Please see the instruction sheet given to you today.   Follow-Up: At [facility], you and your health needs are our priority.  As part of our continuing mission to provide you with exceptional heart care, we have created designated Provider Care Teams.  These Care Teams include your primary Cardiologist (physician) and Advanced Practice Providers (APPs -  Physician Assistants and Nurse Practitioners) who all work together to provide you with the care you need, when you need it.  We recommend signing up for the patient portal called "MyChart".  Sign up information is provided on this After Visit Summary.  MyChart is used to connect with patients for Virtual Visits (Telemedicine).  Patients are able to view lab/test results, encounter notes, upcoming appointments, etc.  Non-urgent messages can be sent to your provider as well.   To learn more about what you can do with MyChart, go to [email or web address].    Your next appointment:   3 month(s)  Provider:   Mertie Moores, MD       Dear Jaquelyn Bitter  You are scheduled for a Cardioversion on Wednesday, April 3 with Dr. Johney Frame.  Please arrive at the [facility] (Main Entrance A) at [facility]: [address] [place], [place] [address] at 12:00 PM.   DIET:  Nothing  to eat or drink after midnight except a sip of water with medications (see medication instructions below)  MEDICATION INSTRUCTIONS: Continue taking your anticoagulant (blood thinner): Apixaban (Eliquis).  You will need to continue this after your procedure until you are told by your provider that it is safe to stop.    LABS:  Your labs will be done at the hospital prior to your procedure - you will need to arrive 1 and 1/2 hours prior to your procedure.  FYI:  For your safety, and to allow [place] to monitor your vital signs accurately during the surgery/procedure we request: If you have artificial nails, gel coating, SNS etc, please have those removed prior to your surgery/procedure. Not having the nail coverings /polish removed may result in cancellation or delay of your surgery/procedure.  You must have a responsible person to drive you home and stay in the waiting area during your procedure. Failure to do so could result in cancellation.  Bring your insurance cards.  *Special Note: Every effort is made to have your procedure done on time. Occasionally there are emergencies that occur at the hospital that may cause delays. Please be patient if a delay does occur.

## 2023-02-07 ENCOUNTER — Inpatient Hospital Stay (HOSPITAL_COMMUNITY)
Admission: EM | Admit: 2023-02-07 | Discharge: 2023-02-10 | DRG: 193 | Disposition: A | Discharge status: Home / Self Care | Payer: Medicare Other | Attending: Internal Medicine | Admitting: Internal Medicine

## 2023-02-07 ENCOUNTER — Encounter (HOSPITAL_COMMUNITY): Payer: Self-pay

## 2023-02-07 ENCOUNTER — Emergency Department (HOSPITAL_COMMUNITY): Payer: Medicare Other

## 2023-02-07 DIAGNOSIS — I4891 Unspecified atrial fibrillation: Secondary | ICD-10-CM | POA: Diagnosis present

## 2023-02-07 DIAGNOSIS — E876 Hypokalemia: Secondary | ICD-10-CM | POA: Diagnosis present

## 2023-02-07 DIAGNOSIS — R911 Solitary pulmonary nodule: Secondary | ICD-10-CM | POA: Diagnosis present

## 2023-02-07 DIAGNOSIS — Z809 Family history of malignant neoplasm, unspecified: Secondary | ICD-10-CM

## 2023-02-07 DIAGNOSIS — J189 Pneumonia, unspecified organism: Secondary | ICD-10-CM | POA: Diagnosis not present

## 2023-02-07 DIAGNOSIS — Z8701 Personal history of pneumonia (recurrent): Secondary | ICD-10-CM

## 2023-02-07 DIAGNOSIS — I252 Old myocardial infarction: Secondary | ICD-10-CM

## 2023-02-07 DIAGNOSIS — Z7901 Long term (current) use of anticoagulants: Secondary | ICD-10-CM

## 2023-02-07 DIAGNOSIS — E782 Mixed hyperlipidemia: Secondary | ICD-10-CM | POA: Diagnosis present

## 2023-02-07 DIAGNOSIS — Z955 Presence of coronary angioplasty implant and graft: Secondary | ICD-10-CM

## 2023-02-07 DIAGNOSIS — J9601 Acute respiratory failure with hypoxia: Secondary | ICD-10-CM | POA: Diagnosis not present

## 2023-02-07 DIAGNOSIS — H409 Unspecified glaucoma: Secondary | ICD-10-CM | POA: Diagnosis present

## 2023-02-07 DIAGNOSIS — Z1152 Encounter for screening for COVID-19: Secondary | ICD-10-CM

## 2023-02-07 DIAGNOSIS — Z79899 Other long term (current) drug therapy: Secondary | ICD-10-CM

## 2023-02-07 DIAGNOSIS — Z801 Family history of malignant neoplasm of trachea, bronchus and lung: Secondary | ICD-10-CM

## 2023-02-07 DIAGNOSIS — Z6825 Body mass index (BMI) 25.0-25.9, adult: Secondary | ICD-10-CM

## 2023-02-07 DIAGNOSIS — I2510 Atherosclerotic heart disease of native coronary artery without angina pectoris: Secondary | ICD-10-CM | POA: Insufficient documentation

## 2023-02-07 DIAGNOSIS — R739 Hyperglycemia, unspecified: Secondary | ICD-10-CM | POA: Diagnosis present

## 2023-02-07 DIAGNOSIS — E441 Mild protein-calorie malnutrition: Secondary | ICD-10-CM | POA: Diagnosis present

## 2023-02-07 DIAGNOSIS — R17 Unspecified jaundice: Secondary | ICD-10-CM | POA: Diagnosis present

## 2023-02-07 DIAGNOSIS — I482 Chronic atrial fibrillation, unspecified: Secondary | ICD-10-CM | POA: Diagnosis present

## 2023-02-07 DIAGNOSIS — I10 Essential (primary) hypertension: Secondary | ICD-10-CM | POA: Diagnosis present

## 2023-02-07 DIAGNOSIS — I350 Nonrheumatic aortic (valve) stenosis: Secondary | ICD-10-CM | POA: Diagnosis present

## 2023-02-07 NOTE — ED Triage Notes (Signed)
Pt comes from home via [facility] for SOB, diagnosed with pneumonia on Friday, given steroids and z pack, today began to have increased SOB, with audible wheezing and 82% on RA upon EMS arrival. PTA received 5 albuterol 125 mg of solumedrol, was on CPAP for transport, now off.

## 2023-02-07 NOTE — ED Provider Notes (Signed)
[facility] Provider Note  CSN: [id number] Arrival date & time: 02/07/23 2342  Chief Complaint(s) Shortness of Breath ED Triage Notes Molli Posey, RN (Registered Nurse)  Emergency Medicine  Date of Service: 02/07/2023 11:49 PM  Signed Pt comes from home via [facility] for SOB, diagnosed with pneumonia on Friday, given steroids and z pack, today began to have increased SOB, with audible wheezing and 82% on RA upon EMS arrival. PTA received 5 albuterol 125 mg of solumedrol, was on CPAP for transport, now off.     HPI Anthony Carpenter is a 82 y.o. male with a past medical history listed below including hypertension, hyperlipidemia, aortic stenosis, CAD status post stenting, A-fib on Eliquis, recently treated pneumonia who presents to the emergency department for gradually worsening shortness of breath.  This evening became severe prompting a call to EMS who noted patient had audible wheezing and sats of 82 on room air.  Patient does not use supplemental oxygen at home.  He was given nebulizer, Solu-Medrol and placed on CPAP which provided significant relief.  Patient denies any associated chest pain.  He does have mild peripheral edema worse over the past week.  No other physical complaints.  The history is provided by the patient and the EMS personnel.    Past Medical History Past Medical History:  Diagnosis Date   Aortic stenosis, mild    Bicuspid aortic valve    Bradycardia    Carotid bruit present    a. Right side   Coronary artery disease    a. CAD s/p PTCA/Taxus stent to mid-prox LAD 2006  b.  cath 10/02/14 with 60% focal instent restenosis in LAD prox to the Diag takeoff, 30% mid AV groove left circ stenosis and luminal irregularities in a large dominant RCA. FFR of LAD was 0.85 and not felt to be HD significant to warrant PCI.    Glaucoma    Hyperlipidemia    Hypertension    Myocardial infarction [facility]) 2006   Pulmonary nodule seen  on imaging study    a. 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 Authorizing Provider  albuterol (VENTOLIN HFA) 108 (90 Base) MCG/ACT inhaler Inhale 2 puffs into the lungs every 6 (six) hours as needed for wheezing or shortness of breath. 02/05/23   Nahser, Wonda Cheng, MD  apixaban (ELIQUIS) 5 MG TABS tablet Take 1 tablet (5 mg total) by mouth 2 (two) times daily. 02/05/23   Nahser, Wonda Cheng, MD  atorvastatin (LIPITOR) 80 MG tablet TAKE 1 TABLET BY MOUTH DAILY AT  6 PM 03/21/22   Nahser, Wonda Cheng, MD  azithromycin (ZITHROMAX) 250 MG tablet Take 250 mg by mouth as directed. 02/03/23   [provider]  dorzolamide-timolol (COSOPT) 22.3-6.8 MG/ML ophthalmic solution Place 1 drop into both eyes 2 (  two) times daily.     [provider]  latanoprost (XALATAN) 0.005 % ophthalmic solution Place 1 drop into both eyes at bedtime.  Patient not taking: Reported on 02/05/2023    [provider]  lisinopril (ZESTRIL) 5 MG tablet Take 1 tablet (5 mg total) by mouth daily. 04/19/22   Swinyer, Lanice Schwab, NP  Multiple Vitamins-Minerals (ICAPS AREDS FORMULA PO) Take 1 tablet by mouth 2 (two) times daily.     [provider]  nitroGLYCERIN (NITROSTAT) 0.4 MG SL tablet Place 1 tablet (0.4 mg total) under the tongue every 5 (five) minutes as  needed. For chest pain. 03/06/22   Swinyer, Lanice Schwab, NP  predniSONE (DELTASONE) 10 MG tablet Take by mouth. 02/03/23   [provider]                                                                                                                                    Allergies Patient has no known allergies.  Review of Systems Review of Systems As noted in HPI  Physical Exam Vital Signs  I have reviewed the triage vital signs BP 107/80   Pulse 85   Temp (!) 100.9 F (38.3 C) (Oral)   Resp 15   SpO2 99%   Physical Exam Vitals reviewed.  Constitutional:      General: He is not in acute distress.    Appearance: He is well-developed. He is not diaphoretic.  HENT:     Head: Normocephalic and atraumatic.     Nose: Nose normal.  Eyes:     General: No scleral icterus.       Right eye: No discharge.        Left eye: No discharge.     Conjunctiva/sclera: Conjunctivae normal.     Pupils: Pupils are equal, round, and reactive to light.  Cardiovascular:     Rate and Rhythm: Normal rate and regular rhythm.     Heart sounds: No murmur heard.    No friction rub. No gallop.  Pulmonary:     Effort: Pulmonary effort is normal. No respiratory distress.     Breath sounds: No stridor. Examination of the right-lower field reveals rales. Examination of the left-lower field reveals rales. Wheezing (faint) and rales present.  Abdominal:     General: There is no distension.     Palpations: Abdomen is soft.     Tenderness: There is no abdominal tenderness.  Musculoskeletal:        General: No tenderness.     Cervical back: Normal range of motion and neck supple.     Right lower leg: 1+ Pitting Edema present.     Left lower leg: 1+ Pitting Edema present.  Skin:    General: Skin is warm and dry.     Findings: No erythema or rash.  Neurological:     Mental Status: He is alert and oriented to person, place, and time.  ED Results and Treatments Labs (all labs ordered are listed,  but only abnormal results are displayed) Labs Reviewed  COMPREHENSIVE METABOLIC PANEL - Abnormal; Notable for the following components:      Result Value   Potassium 3.3 (*)    CO2 21 (*)    Glucose, Bld 177 (*)    Calcium 8.3 (*)    Total Protein 6.4 (*)    Albumin 3.4 (*)    Total Bilirubin 1.7 (*)    All other components within normal limits  BRAIN NATRIURETIC PEPTIDE - Abnormal; Notable for the following components:   B Natriuretic Peptide 474.8 (*)    All other components within normal limits  BLOOD GAS, VENOUS - Abnormal; Notable for the following components:   pH, Ven 7.44 (*)    pCO2, Ven 34 (*)    pO2, Ven 62 (*)    All other components within normal limits  PROTIME-INR - Abnormal; Notable for the following components:   Prothrombin Time 19.8 (*)    INR 1.7 (*)    All other components within normal limits  RESP PANEL BY RT-PCR (RSV, FLU A&B, COVID)  [id number]  CBC WITH DIFFERENTIAL/PLATELET  LACTIC ACID, PLASMA  I-STAT CHEM 8, ED                                                                                                                         EKG  EKG Interpretation  Date/Time:  Wednesday February 07 2023 23:59:54 EDT Ventricular Rate:  89 PR Interval:    QRS Duration: 91 QT Interval:  357 QTC Calculation: 435 R Axis:   79 Text Interpretation: Atrial fibrillation Low voltage, precordial leads Minimal ST depression, inferior leads Confirmed by Addison Lank [phone]) on 02/08/2023 12:49:17 AM       Radiology DG Chest Port 1 View  Result Date: 02/08/2023 CLINICAL DATA:  Hypoxia. EXAM: PORTABLE CHEST 1 VIEW COMPARISON:  Chest radiograph dated 12/04/2018. FINDINGS: Cardiomegaly with vascular congestion and edema. Bilateral mid to lower lung field airspace disease may represent edema. Pneumonia is not excluded clinical correlation is recommended. Coronary vascular calcification or stent. No acute osseous pathology. IMPRESSION: Cardiomegaly with vascular congestion and  edema. Pneumonia is not excluded. Electronically Signed   By: Anner Crete M.D.   On: 02/08/2023 00:39    Medications Ordered in ED Medications  cefTRIAXone (ROCEPHIN) 2 g in sodium chloride 0.9 % 100 mL IVPB (has no administration in time range)  azithromycin (ZITHROMAX) 500 mg in sodium chloride 0.9 % 250 mL IVPB (has no administration in time range)  ipratropium-albuterol (DUONEB) 0.5-2.5 (3) MG/3ML nebulizer solution 3 mL (has no administration in time range)  apixaban (ELIQUIS) tablet 5 mg (has no administration in time range)  lisinopril (ZESTRIL) tablet 5 mg (has no administration in time range)  furosemide (LASIX) injection 40 mg (has no administration in time range)  potassium chloride 10 mEq in 100 mL IVPB (has no administration in time range)  cefTRIAXone (ROCEPHIN) 1 g  in sodium chloride 0.9 % 100 mL IVPB (0 g Intravenous Stopped 02/08/23 0150)  azithromycin (ZITHROMAX) 500 mg in sodium chloride 0.9 % 250 mL IVPB (500 mg Intravenous New Bag/Given 02/08/23 0151)                                                                                                                                     Procedures .1-3 Lead EKG Interpretation  Performed by: Fatima Blank, MD Authorized by: Fatima Blank, MD     Interpretation: abnormal     ECG rate:  86   ECG rate assessment: normal     Rhythm: atrial fibrillation     Ectopy: none     Conduction: normal   .Critical Care  Performed by: Fatima Blank, MD Authorized by: Fatima Blank, MD   Critical care provider statement:    Critical care time (minutes):  45   Critical care time was exclusive of:  Separately billable procedures and treating other patients   Critical care was necessary to treat or prevent imminent or life-threatening deterioration of the following conditions:  Respiratory failure   Critical care was time spent personally by me on the following activities:  Development of treatment  plan with patient or surrogate, discussions with consultants, evaluation of patient's response to treatment, examination of patient, obtaining history from patient or surrogate, review of old charts, re-evaluation of patient's condition, pulse oximetry, ordering and review of radiographic studies, ordering and review of laboratory studies and ordering and performing treatments and interventions   Care discussed with: admitting provider     (including critical care time)  Medical Decision Making / ED Course  Click here for ABCD2, HEART and other calculators  Medical Decision Making Amount and/or Complexity of Data Reviewed Labs: ordered. Decision-making details documented in ED Course. Radiology: ordered and independent interpretation performed. Decision-making details documented in ED Course. ECG/medicine tests: ordered and independent interpretation performed. Decision-making details documented in ED Course.    This patient presents to the ED for concern of respiratory distress, this involves an extensive number of treatment options, and is a complaint that carries with it a high risk of complications and morbidity. The differential diagnosis includes but not limited to pneumonia, pneumothorax, heart failure.  Less concerning for pulmonary embolism.  Patient transition from CPAP to 6 L nasal cannula. Noted to be febrile with a temperature of 100.9.  Workup; CBC without leukocytosis or anemia Metabolic panel with mild hypokalemia.  Hyperglycemia without DKA.  No renal insufficiency. COVID/influenza negative Chest x-ray with bibasilar opacities concerning for edema versus pneumonia.  Given his reported history of pneumonia, patient was started on empiric antibiotics.  After BMP resulted and noted to be elevated, 40 mg of Lasix ordered. Also ordered IV repletion of potassium.  Patient discussed and admitted to the hospitalist service       Final Clinical Impression(s) / ED  Diagnoses Final diagnoses:  Multifocal pneumonia  Acute respiratory failure with hypoxia (HCC)           This chart was dictated using voice recognition software.  Despite best efforts to proofread,  errors can occur which can change the documentation meaning.    Fatima Blank, MD 02/08/23 [phone]

## 2023-02-08 ENCOUNTER — Encounter (HOSPITAL_COMMUNITY): Payer: Self-pay | Admitting: Internal Medicine

## 2023-02-08 ENCOUNTER — Other Ambulatory Visit: Payer: Self-pay

## 2023-02-08 DIAGNOSIS — Z809 Family history of malignant neoplasm, unspecified: Secondary | ICD-10-CM | POA: Diagnosis not present

## 2023-02-08 DIAGNOSIS — I2510 Atherosclerotic heart disease of native coronary artery without angina pectoris: Secondary | ICD-10-CM | POA: Insufficient documentation

## 2023-02-08 DIAGNOSIS — J9601 Acute respiratory failure with hypoxia: Secondary | ICD-10-CM | POA: Diagnosis present

## 2023-02-08 DIAGNOSIS — I350 Nonrheumatic aortic (valve) stenosis: Secondary | ICD-10-CM | POA: Diagnosis present

## 2023-02-08 DIAGNOSIS — E441 Mild protein-calorie malnutrition: Secondary | ICD-10-CM | POA: Diagnosis present

## 2023-02-08 DIAGNOSIS — I482 Chronic atrial fibrillation, unspecified: Secondary | ICD-10-CM | POA: Diagnosis present

## 2023-02-08 DIAGNOSIS — E876 Hypokalemia: Secondary | ICD-10-CM | POA: Diagnosis present

## 2023-02-08 DIAGNOSIS — J189 Pneumonia, unspecified organism: Secondary | ICD-10-CM | POA: Diagnosis present

## 2023-02-08 DIAGNOSIS — H409 Unspecified glaucoma: Secondary | ICD-10-CM | POA: Diagnosis present

## 2023-02-08 DIAGNOSIS — Z7901 Long term (current) use of anticoagulants: Secondary | ICD-10-CM | POA: Diagnosis not present

## 2023-02-08 DIAGNOSIS — R739 Hyperglycemia, unspecified: Secondary | ICD-10-CM | POA: Diagnosis present

## 2023-02-08 DIAGNOSIS — R911 Solitary pulmonary nodule: Secondary | ICD-10-CM | POA: Diagnosis present

## 2023-02-08 DIAGNOSIS — I10 Essential (primary) hypertension: Secondary | ICD-10-CM | POA: Diagnosis present

## 2023-02-08 DIAGNOSIS — Z801 Family history of malignant neoplasm of trachea, bronchus and lung: Secondary | ICD-10-CM | POA: Diagnosis not present

## 2023-02-08 DIAGNOSIS — R17 Unspecified jaundice: Secondary | ICD-10-CM | POA: Diagnosis present

## 2023-02-08 DIAGNOSIS — Z79899 Other long term (current) drug therapy: Secondary | ICD-10-CM | POA: Diagnosis not present

## 2023-02-08 DIAGNOSIS — Z6825 Body mass index (BMI) 25.0-25.9, adult: Secondary | ICD-10-CM | POA: Diagnosis not present

## 2023-02-08 DIAGNOSIS — I252 Old myocardial infarction: Secondary | ICD-10-CM | POA: Diagnosis not present

## 2023-02-08 DIAGNOSIS — Z1152 Encounter for screening for COVID-19: Secondary | ICD-10-CM | POA: Diagnosis not present

## 2023-02-08 DIAGNOSIS — I4891 Unspecified atrial fibrillation: Secondary | ICD-10-CM | POA: Diagnosis present

## 2023-02-08 DIAGNOSIS — Z8701 Personal history of pneumonia (recurrent): Secondary | ICD-10-CM | POA: Diagnosis not present

## 2023-02-08 DIAGNOSIS — E782 Mixed hyperlipidemia: Secondary | ICD-10-CM | POA: Diagnosis present

## 2023-02-08 DIAGNOSIS — Z955 Presence of coronary angioplasty implant and graft: Secondary | ICD-10-CM | POA: Diagnosis not present

## 2023-02-08 LAB — CBC WITH DIFFERENTIAL/PLATELET
Abs Immature Granulocytes: 0.02 10*3/uL (ref 0.00–0.07)
Basophils Absolute: 0 10*3/uL (ref 0.0–0.1)
Basophils Relative: 0 %
Eosinophils Absolute: 0 10*3/uL (ref 0.0–0.5)
Eosinophils Relative: 0 %
HCT: 42.2 % (ref 39.0–52.0)
Hemoglobin: 13.4 g/dL (ref 13.0–17.0)
Immature Granulocytes: 0 %
Lymphocytes Relative: 12 %
Lymphs Abs: 1 10*3/uL (ref 0.7–4.0)
MCH: 28 pg (ref 26.0–34.0)
MCHC: 31.8 g/dL (ref 30.0–36.0)
MCV: 88.1 fL (ref 80.0–100.0)
Monocytes Absolute: 0.8 10*3/uL (ref 0.1–1.0)
Monocytes Relative: 10 %
Neutro Abs: 6.6 10*3/uL (ref 1.7–7.7)
Neutrophils Relative %: 78 %
Platelets: 169 10*3/uL (ref 150–400)
RBC: 4.79 MIL/uL (ref 4.22–5.81)
RDW: 14.6 % (ref 11.5–15.5)
WBC: 8.5 10*3/uL (ref 4.0–10.5)
nRBC: 0 % (ref 0.0–0.2)

## 2023-02-08 LAB — COMPREHENSIVE METABOLIC PANEL
ALT: 34 U/L (ref 0–44)
AST: 37 U/L (ref 15–41)
Albumin: 3.4 g/dL — ABNORMAL LOW (ref 3.5–5.0)
Alkaline Phosphatase: 93 U/L (ref 38–126)
Anion gap: 10 (ref 5–15)
BUN: 21 mg/dL (ref 8–23)
CO2: 21 mmol/L — ABNORMAL LOW (ref 22–32)
Calcium: 8.3 mg/dL — ABNORMAL LOW (ref 8.9–10.3)
Chloride: 104 mmol/L (ref 98–111)
Creatinine, Ser: 1.14 mg/dL (ref 0.61–1.24)
GFR, Estimated: >60 mL/min (ref >60)
Glucose, Bld: 177 mg/dL — ABNORMAL HIGH (ref 70–99)
Potassium: 3.3 mmol/L — ABNORMAL LOW (ref 3.5–5.1)
Sodium: 135 mmol/L (ref 135–145)
Total Bilirubin: 1.7 mg/dL — ABNORMAL HIGH (ref 0.3–1.2)
Total Protein: 6.4 g/dL — ABNORMAL LOW (ref 6.5–8.1)

## 2023-02-08 LAB — BLOOD GAS, VENOUS
Acid-base deficit: 0.5 mmol/L (ref 0.0–2.0)
Bicarbonate: 23.1 mmol/L (ref 20.0–28.0)
O2 Saturation: 94.7 %
Patient temperature: 37
pCO2, Ven: 34 mmHg — ABNORMAL LOW (ref 44–60)
pH, Ven: 7.44 — ABNORMAL HIGH (ref 7.25–7.43)
pO2, Ven: 62 mmHg — ABNORMAL HIGH (ref 32–45)

## 2023-02-08 LAB — RESP PANEL BY RT-PCR (RSV, FLU A&B, COVID)  RVPGX2
Influenza A by PCR: NEGATIVE
Influenza B by PCR: NEGATIVE
Resp Syncytial Virus by PCR: NEGATIVE
SARS Coronavirus 2 by RT PCR: NEGATIVE

## 2023-02-08 LAB — STREP PNEUMONIAE URINARY ANTIGEN: Strep Pneumo Urinary Antigen: NEGATIVE

## 2023-02-08 LAB — PROTIME-INR
INR: 1.7 — ABNORMAL HIGH (ref 0.8–1.2)
Prothrombin Time: 19.8 seconds — ABNORMAL HIGH (ref 11.4–15.2)

## 2023-02-08 LAB — BASIC METABOLIC PANEL
Anion gap: 12 (ref 5–15)
BUN: 19 mg/dL (ref 8–23)
CO2: 25 mmol/L (ref 22–32)
Calcium: 8.4 mg/dL — ABNORMAL LOW (ref 8.9–10.3)
Chloride: 102 mmol/L (ref 98–111)
Creatinine, Ser: 1.15 mg/dL (ref 0.61–1.24)
GFR, Estimated: >60 mL/min (ref >60)
Glucose, Bld: 169 mg/dL — ABNORMAL HIGH (ref 70–99)
Potassium: 3.5 mmol/L (ref 3.5–5.1)
Sodium: 139 mmol/L (ref 135–145)

## 2023-02-08 LAB — EXPECTORATED SPUTUM ASSESSMENT W GRAM STAIN, RFLX TO RESP C

## 2023-02-08 LAB — LACTIC ACID, PLASMA: Lactic Acid, Venous: 1.9 mmol/L (ref 0.5–1.9)

## 2023-02-08 LAB — BRAIN NATRIURETIC PEPTIDE: B Natriuretic Peptide: 474.8 pg/mL — ABNORMAL HIGH (ref 0.0–100.0)

## 2023-02-08 LAB — PROCALCITONIN: Procalcitonin: 2.4 ng/mL

## 2023-02-08 MED ORDER — SODIUM CHLORIDE 0.9 % IV SOLN: INTRAVENOUS | Status: DC

## 2023-02-08 MED ORDER — ONDANSETRON HCL 4 MG/2ML IJ SOLN: 4.0000 mg | Freq: Four times a day (QID) | INTRAMUSCULAR | Status: DC | PRN

## 2023-02-08 MED ORDER — IPRATROPIUM-ALBUTEROL 0.5-2.5 (3) MG/3ML IN SOLN: 3.0000 mL | RESPIRATORY_TRACT | Status: DC | PRN

## 2023-02-08 MED ORDER — POTASSIUM CHLORIDE CRYS ER 20 MEQ PO TBCR
40.0000 meq | EXTENDED_RELEASE_TABLET | Freq: Once | ORAL | Status: AC
  Administered 2023-02-08: 40 meq via ORAL
  Filled 2023-02-08: qty 2

## 2023-02-08 MED ORDER — LISINOPRIL 5 MG PO TABS
5.0000 mg | ORAL_TABLET | Freq: Every day | ORAL | Status: DC
  Administered 2023-02-08 – 2023-02-10 (×3): 5 mg via ORAL
  Filled 2023-02-08 (×3): qty 1

## 2023-02-08 MED ORDER — ACETAMINOPHEN 650 MG RE SUPP: 650.0000 mg | Freq: Four times a day (QID) | RECTAL | Status: DC | PRN

## 2023-02-08 MED ORDER — ATORVASTATIN CALCIUM 40 MG PO TABS
80.0000 mg | ORAL_TABLET | Freq: Every day | ORAL | Status: DC
  Administered 2023-02-08 – 2023-02-09 (×2): 80 mg via ORAL
  Filled 2023-02-08 (×2): qty 2

## 2023-02-08 MED ORDER — SODIUM CHLORIDE 0.9 % IV SOLN: INTRAVENOUS | Status: AC

## 2023-02-08 MED ORDER — POTASSIUM CHLORIDE 10 MEQ/100ML IV SOLN
10.0000 meq | INTRAVENOUS | Status: AC
  Administered 2023-02-08 (×2): 10 meq via INTRAVENOUS
  Filled 2023-02-08: qty 100

## 2023-02-08 MED ORDER — SODIUM CHLORIDE 0.9 % IV SOLN
500.0000 mg | Freq: Once | INTRAVENOUS | Status: AC
  Administered 2023-02-08: 500 mg via INTRAVENOUS
  Filled 2023-02-08: qty 5

## 2023-02-08 MED ORDER — NITROGLYCERIN 0.4 MG SL SUBL: 0.4000 mg | SUBLINGUAL_TABLET | SUBLINGUAL | Status: DC | PRN

## 2023-02-08 MED ORDER — APIXABAN 5 MG PO TABS
5.0000 mg | ORAL_TABLET | Freq: Two times a day (BID) | ORAL | Status: DC
  Administered 2023-02-08 – 2023-02-10 (×5): 5 mg via ORAL
  Filled 2023-02-08 (×5): qty 1

## 2023-02-08 MED ORDER — ACETAMINOPHEN 325 MG PO TABS: 650.0000 mg | ORAL_TABLET | Freq: Four times a day (QID) | ORAL | Status: DC | PRN

## 2023-02-08 MED ORDER — SODIUM CHLORIDE 0.9 % IV SOLN
1.0000 g | Freq: Once | INTRAVENOUS | Status: AC
  Administered 2023-02-08: 1 g via INTRAVENOUS
  Filled 2023-02-08: qty 10

## 2023-02-08 MED ORDER — ONDANSETRON HCL 4 MG PO TABS: 4.0000 mg | ORAL_TABLET | Freq: Four times a day (QID) | ORAL | Status: DC | PRN

## 2023-02-08 MED ORDER — FUROSEMIDE 10 MG/ML IJ SOLN
40.0000 mg | Freq: Once | INTRAMUSCULAR | Status: AC
  Administered 2023-02-08: 40 mg via INTRAVENOUS
  Filled 2023-02-08: qty 4

## 2023-02-08 MED ORDER — ORAL CARE MOUTH RINSE: 15.0000 mL | OROMUCOSAL | Status: DC | PRN

## 2023-02-08 MED ORDER — SODIUM CHLORIDE 0.9 % IV SOLN
2.0000 g | INTRAVENOUS | Status: DC
  Administered 2023-02-09: 2 g via INTRAVENOUS
  Filled 2023-02-08: qty 20

## 2023-02-08 MED ORDER — SODIUM CHLORIDE 0.9 % IV SOLN
500.0000 mg | INTRAVENOUS | Status: DC
  Administered 2023-02-09: 500 mg via INTRAVENOUS
  Filled 2023-02-08 (×2): qty 5

## 2023-02-08 NOTE — ED Notes (Signed)
ED TO INPATIENT HANDOFF REPORT  Name/Age/Gender Jaquelyn Bitter 82 y.o. male  Code Status Code Status History     Date Active Date Inactive Code Status Order ID Comments User Context   01/10/2022 [id number] 01/10/2022 2156 Full Code [id number]  Leonie Man, MD Inpatient   02/11/2015 1345 02/12/2015 1504 Full Code [id number]  Burnell Blanks, MD Inpatient   10/02/2014 1413 10/03/2014 1643 Full Code [id number]  Troy Sine, MD Inpatient   10/01/2014 1237 10/02/2014 1413 Full Code [id number]  Croitoru, Dani Gobble, MD Inpatient       Home/SNF/Other Home  Chief Complaint Acute respiratory failure with hypoxia ([place]) [J96.01]  Level of Care/Admitting Diagnosis ED Disposition     ED Disposition  Admit   Condition  --   [facility]: [facility] [place] [[id number]]  Level of Care: Progressive [102]  Admit to Progressive based on following criteria: RESPIRATORY PROBLEMS hypoxemic/hypercapnic respiratory failure that is responsive to NIPPV (BiPAP) or High Flow Nasal Cannula (6-80 lpm). Frequent assessment/intervention, no > Q2 hrs < Q4 hrs, to maintain oxygenation and pulmonary hygiene.  May admit patient to [facility] or [facility] if equivalent level of care is available:: No  Covid Evaluation: Confirmed COVID Negative  Diagnosis: Acute respiratory failure with hypoxia [facility]) [id number]  Admitting Physician: Reubin Milan [id number]  Attending Physician: Reubin Milan [id number]  Certification:: I certify this patient will need inpatient services for at least 2 midnights  Estimated Length of Stay: 2          Medical History Past Medical History:  Diagnosis Date   Aortic stenosis, mild    Bicuspid aortic valve    Bradycardia    Carotid bruit present    a. Right side   Coronary artery disease    a. CAD s/p PTCA/Taxus stent to mid-prox LAD 2006  b.  cath 10/02/14 with 60% focal instent restenosis in LAD prox to the Diag takeoff, 30% mid AV groove  left circ stenosis and luminal irregularities in a large dominant RCA. FFR of LAD was 0.85 and not felt to be HD significant to warrant PCI.    Glaucoma    Hyperlipidemia    Hypertension    Myocardial infarction [facility]) 2006   Pulmonary nodule seen on imaging study    a. seen on CT during 09/2014 admission, may require follow up imaging     Allergies No Known Allergies  IV Location/Drains/Wounds Patient Lines/Drains/Airways Status     Active Line/Drains/Airways     Name Placement date Placement time Site Days   Peripheral IV 02/07/23 20 G Right Forearm 02/07/23  2352  Forearm  1            Labs/Imaging Results for orders placed or performed during the hospital encounter of 02/07/23 (from the past 48 hour(s))  Resp panel by RT-PCR (RSV, Flu A&B, Covid) Anterior Nasal Swab     Status: None   Collection Time: 02/08/23 12:07 AM   Specimen: Anterior Nasal Swab  Result Value Ref Range   SARS Coronavirus 2 by RT PCR NEGATIVE NEGATIVE    Comment: (NOTE) SARS-CoV-2 target nucleic acids are NOT DETECTED.  The SARS-CoV-2 RNA is generally detectable in upper respiratory specimens during the acute phase of infection. The lowest concentration of SARS-CoV-2 viral copies this assay can detect is 138 copies/mL. A negative result does not preclude SARS-Cov-2 infection and should not be used as the sole basis for treatment or other patient management decisions. A negative result  may occur with  improper specimen collection/handling, submission of specimen other than nasopharyngeal swab, presence of viral mutation(s) within the areas targeted by this assay, and inadequate number of viral copies(<138 copies/mL). A negative result must be combined with clinical observations, patient history, and epidemiological information. The expected result is Negative.  Fact Sheet for Patients:  EntrepreneurPulse.com.au  Fact Sheet for Healthcare Providers:   IncredibleEmployment.be  This test is no t yet approved or cleared by the [place] FDA and  has been authorized for detection and/or diagnosis of SARS-CoV-2 by FDA under an Emergency Use Authorization (EUA). This EUA will remain  in effect (meaning this test can be used) for the duration of the COVID-19 declaration under Section 564(b)(1) of the Act, 21 U.S.C.section 360bbb-3(b)(1), unless the authorization is terminated  or revoked sooner.       Influenza A by PCR NEGATIVE NEGATIVE   Influenza B by PCR NEGATIVE NEGATIVE    Comment: (NOTE) The Xpert Xpress SARS-CoV-2/FLU/RSV plus assay is intended as an aid in the diagnosis of influenza from Nasopharyngeal swab specimens and should not be used as a sole basis for treatment. Nasal washings and aspirates are unacceptable for Xpert Xpress SARS-CoV-2/FLU/RSV testing.  Fact Sheet for Patients: EntrepreneurPulse.com.au  Fact Sheet for Healthcare Providers: IncredibleEmployment.be  This test is not yet approved or cleared by the [place] FDA and has been authorized for detection and/or diagnosis of SARS-CoV-2 by FDA under an Emergency Use Authorization (EUA). This EUA will remain in effect (meaning this test can be used) for the duration of the COVID-19 declaration under Section 564(b)(1) of the Act, 21 U.S.C. section 360bbb-3(b)(1), unless the authorization is terminated or revoked.     Resp Syncytial Virus by PCR NEGATIVE NEGATIVE    Comment: (NOTE) Fact Sheet for Patients: EntrepreneurPulse.com.au  Fact Sheet for Healthcare Providers: IncredibleEmployment.be  This test is not yet approved or cleared by the [place] FDA and has been authorized for detection and/or diagnosis of SARS-CoV-2 by FDA under an Emergency Use Authorization (EUA). This EUA will remain in effect (meaning this test can be used) for the duration of  the COVID-19 declaration under Section 564(b)(1) of the Act, 21 U.S.C. section 360bbb-3(b)(1), unless the authorization is terminated or revoked.  Performed at [facility], [place] [address] [place], [place] [address]   Comprehensive metabolic panel     Status: Abnormal   Collection Time: 02/08/23 12:07 AM  Result Value Ref Range   Sodium 135 135 - 145 mmol/L   Potassium 3.3 (L) 3.5 - 5.1 mmol/L   Chloride 104 98 - 111 mmol/L   CO2 21 (L) 22 - 32 mmol/L   Glucose, Bld 177 (H) 70 - 99 mg/dL    Comment: Glucose reference range applies only to samples taken after fasting for at least 8 hours.   BUN 21 8 - 23 mg/dL   Creatinine, Ser 1.14 0.61 - 1.24 mg/dL   Calcium 8.3 (L) 8.9 - 10.3 mg/dL   Total Protein 6.4 (L) 6.5 - 8.1 g/dL   Albumin 3.4 (L) 3.5 - 5.0 g/dL   AST 37 15 - 41 U/L   ALT 34 0 - 44 U/L   Alkaline Phosphatase 93 38 - 126 U/L   Total Bilirubin 1.7 (H) 0.3 - 1.2 mg/dL   GFR, Estimated >60 >60 mL/min    Comment: (NOTE) Calculated using the CKD-EPI Creatinine Equation (2021)    Anion gap 10 5 - 15    Comment: Performed at [facility], [place] [address], [place], [place] [address]  Brain natriuretic peptide     Status: Abnormal   Collection Time: 02/08/23 12:07 AM  Result Value Ref Range   B Natriuretic Peptide 474.8 (H) 0.0 - 100.0 pg/mL    Comment: Performed at [facility], [place] [address], [place], [place] [address]  CBC with Differential/Platelet     Status: None   Collection Time: 02/08/23 12:07 AM  Result Value Ref Range   WBC 8.5 4.0 - 10.5 K/uL   RBC 4.79 4.22 - 5.81 MIL/uL   Hemoglobin 13.4 13.0 - 17.0 g/dL   HCT 42.2 39.0 - 52.0 %   MCV 88.1 80.0 - 100.0 fL   MCH 28.0 26.0 - 34.0 pg   MCHC 31.8 30.0 - 36.0 g/dL   RDW 14.6 11.5 - 15.5 %   Platelets 169 150 - 400 K/uL   nRBC 0.0 0.0 - 0.2 %   Neutrophils Relative % 78 %   Neutro Abs 6.6 1.7 - 7.7 K/uL   Lymphocytes Relative 12 %   Lymphs Abs 1.0  0.7 - 4.0 K/uL   Monocytes Relative 10 %   Monocytes Absolute 0.8 0.1 - 1.0 K/uL   Eosinophils Relative 0 %   Eosinophils Absolute 0.0 0.0 - 0.5 K/uL   Basophils Relative 0 %   Basophils Absolute 0.0 0.0 - 0.1 K/uL   Immature Granulocytes 0 %   Abs Immature Granulocytes 0.02 0.00 - 0.07 K/uL    Comment: Performed at [facility], [place] [address], [place], [place] [address]  Lactic acid, plasma     Status: None   Collection Time: 02/08/23 12:07 AM  Result Value Ref Range   Lactic Acid, Venous 1.9 0.5 - 1.9 mmol/L    Comment: Performed at [facility], [place] [address] [place], [place] [address]  Protime-INR     Status: Abnormal   Collection Time: 02/08/23 12:07 AM  Result Value Ref Range   Prothrombin Time 19.8 (H) 11.4 - 15.2 seconds   INR 1.7 (H) 0.8 - 1.2    Comment: (NOTE) INR goal varies based on device and disease states. Performed at [facility], [place] [address], [place], [place] [address]   Blood gas, venous     Status: Abnormal   Collection Time: 02/08/23 12:30 AM  Result Value Ref Range   pH, Ven 7.44 (H) 7.25 - 7.43   pCO2, Ven 34 (L) 44 - 60 mmHg   pO2, Ven 62 (H) 32 - 45 mmHg   Bicarbonate 23.1 20.0 - 28.0 mmol/L   Acid-base deficit 0.5 0.0 - 2.0 mmol/L   O2 Saturation 94.7 %   Patient temperature 37.0     Comment: Performed at [facility], [place] [address], [place],  [address]   DG Chest Port 1 View  Result Date: 02/08/2023 CLINICAL DATA:  Hypoxia. EXAM: PORTABLE CHEST 1 VIEW COMPARISON:  Chest radiograph dated 12/04/2018. FINDINGS: Cardiomegaly with vascular congestion and edema. Bilateral mid to lower lung field airspace disease may represent edema. Pneumonia is not excluded clinical correlation is recommended. Coronary vascular calcification or stent. No acute osseous pathology. IMPRESSION: Cardiomegaly with vascular congestion and edema. Pneumonia is not excluded. Electronically  Signed   By: Anner Crete M.D.   On: 02/08/2023 00:39    Pending Labs Unresulted Labs (From admission, onward)    None       Vitals/Pain Today's Vitals   02/08/23 0641 02/08/23 0700 02/08/23  [id number] 02/08/23 0735  BP:  107/68    Pulse: 82 74 87   Resp: (!) 23 (!) 33 19   Temp:      TempSrc:      SpO2: 96% 92% 95%   Weight:    82.4 kg  Height:    5\' 10"  (1.778 m)  PainSc:        Isolation Precautions No active isolations  Medications Medications  cefTRIAXone (ROCEPHIN) 2 g in sodium chloride 0.9 % 100 mL IVPB (has no administration in time range)  azithromycin (ZITHROMAX) 500 mg in sodium chloride 0.9 % 250 mL IVPB (has no administration in time range)  ipratropium-albuterol (DUONEB) 0.5-2.5 (3) MG/3ML nebulizer solution 3 mL (has no administration in time range)  apixaban (ELIQUIS) tablet 5 mg (5 mg Oral Not Given 02/08/23 0340)  lisinopril (ZESTRIL) tablet 5 mg (has no administration in time range)  0.9 %  sodium chloride infusion (0 mLs Intravenous Stopped 02/08/23 0601)  cefTRIAXone (ROCEPHIN) 1 g in sodium chloride 0.9 % 100 mL IVPB (0 g Intravenous Stopped 02/08/23 0150)  azithromycin (ZITHROMAX) 500 mg in sodium chloride 0.9 % 250 mL IVPB (0 mg Intravenous Stopped 02/08/23 0354)  furosemide (LASIX) injection 40 mg (40 mg Intravenous Given 02/08/23 0351)  potassium chloride 10 mEq in 100 mL IVPB (0 mEq Intravenous Stopped 02/08/23 0601)    Mobility walks

## 2023-02-08 NOTE — ED Notes (Signed)
Pt had pulled [place] off while sleeping and spo2 was 93-96% on RA.  Leaving pt on RA at this time to see how he feels.

## 2023-02-08 NOTE — Progress Notes (Signed)
Peak flow done with pt per MD order.  Best of four attempts=130.  Pt coughed during each attempt.

## 2023-02-08 NOTE — Progress Notes (Addendum)
This is a 82 year old male with past medical history of CAD, hyperlipidemia, HTN, and atrial fibrillation.  He was recently started on antibiotics for pneumonia.  Despite being on antibiotics, his shortness of breath and wheeze progressed. 911 was called, and arrived to the hospital EMS placed him on BiPAP. In the ER he has since been weaned to 6 L [place].  He additionally received Solu-Medrol, Rocephin and azithromycin.  He has some peripheral edema, BNP has been ordered, and pending.

## 2023-02-08 NOTE — ED Notes (Signed)
Applied [place] back with 2L at this time

## 2023-02-08 NOTE — H&P (Signed)
History and Physical    Patient: Anthony Carpenter [id number] DOB: Jun 09, 1941 DOA: 02/07/2023 DOS: the patient was seen and examined on 02/08/2023 PCP: Orpah Melter, MD  Patient coming from: Home  Chief Complaint:  Chief Complaint  Patient presents with   Shortness of Breath   HPI: [name] is a 82 y.o. male with medical history significant of aortic stenosis, bicuspid aortic valve, history of bradycardia, right-sided carotid bruit, CAD, history of MI, glaucoma, hyperlipidemia, hypertension, pulmonary nodule who was diagnosed with pneumonia on Friday and presented to the emergency department with complaints of shortness of breath.  He has been recently diagnosed and treated for pneumonia as an outpatient with prednisone and azithromycin.  However, he had not had significant relief.  He has felt chills, but not sure about having fevers at home.  No night sweats. He denied rhinorrhea, sore throat or hemoptysis.  No chest pain, palpitations, diaphoresis, PND, orthopnea or pitting edema of the lower extremities.  No abdominal pain, nausea, emesis, diarrhea, constipation, melena or hematochezia.  No flank pain, dysuria, frequency or hematuria.  No polyuria, polydipsia, polyphagia or blurred vision.   Lab work: CBC was normal.  Venous blood gas with a pH of 7.44, pCO2 of 34 and pO2 of 62 mmHg.  The rest of the measurements were normal.  Lactic acid normal.  Negative coronavirus, influenza and RSV PCR.  CMP showed a potassium of 3.3 and CO2 of 21 mmol/L with a normal anion gap.  Calcium is normal after correction to albumin level.  Normal renal function.  Glucose 177 and total bilirubin 1.7 mg/dL.  Total protein 6.4 and albumin 3.4 g/dL.  Imaging: Portable 1 view chest radiograph with cardiomegaly with vascular congestion and edema.  Unable to exclude pneumonia.  There is coronary vascular calcification or stent.  ED course: Initial vital signs were temperature 98.8 F, but less than an hour later  temperature increased to 100.9 F, pulse 92, respirations 24, BP 121/75 mmHg and O2 sat 95% on nasal cannula oxygen at 5 LPM.  The patient received azithromycin 500 mg IVP, ceftriaxone 1 g IVPB, furosemide 40 mg IVP and potassium chloride 10 mEq IVPB x 2 doses.   Review of Systems: As mentioned in the history of present illness. All other systems reviewed and are negative. Past Medical History:  Diagnosis Date   Aortic stenosis, mild    Bicuspid aortic valve    Bradycardia    Carotid bruit present    a. Right side   Coronary artery disease    a. CAD s/p PTCA/Taxus stent to mid-prox LAD 2006  b.  cath 10/02/14 with 60% focal instent restenosis in LAD prox to the Diag takeoff, 30% mid AV groove left circ stenosis and luminal irregularities in a large dominant RCA. FFR of LAD was 0.85 and not felt to be HD significant to warrant PCI.    Glaucoma    Hyperlipidemia    Hypertension    Myocardial infarction [facility]) 2006   Pulmonary nodule seen on imaging study    a. seen on CT during 09/2014 admission, may require follow up imaging    Past Surgical History:  Procedure Laterality Date   CARDIAC CATHETERIZATION  07/28/2005   Est EF of 55% -- Single-vessel obstructive disease in the proximal/mid left anterior descending -- Preserved left ventricular systolic function with good anterior wall motion   cataracts  5-6 years ago   COLONOSCOPY  ? 2009   CORONARY ANGIOPLASTY WITH STENT PLACEMENT  07/28/2005  Successful PTCA and stenting of the mid and proximal left anterior descending artery.  The patient is stable leaving the catheterization laboratory -- Thayer Headings, M.D.     CORONARY STENT PLACEMENT  02/11/2015   3.0 x 16 mm Promus Premier DES to the mLAD for ISR   LEFT HEART CATH AND CORONARY ANGIOGRAPHY N/A 01/10/2022   Procedure: LEFT HEART CATH AND CORONARY ANGIOGRAPHY;  Surgeon: Leonie Man, MD;  Location: [place] [facility];  Service: Cardiovascular;  Laterality: N/A;   LEFT HEART  CATHETERIZATION WITH CORONARY ANGIOGRAM N/A 10/02/2014   Procedure: LEFT HEART CATHETERIZATION WITH CORONARY ANGIOGRAM;  Surgeon: Troy Sine, MD;  Location: [facility] CATH LAB;  Service: Cardiovascular;  Laterality: N/A;   LEFT HEART CATHETERIZATION WITH CORONARY ANGIOGRAM N/A 02/11/2015   Procedure: LEFT HEART CATHETERIZATION WITH CORONARY ANGIOGRAM;  Surgeon: Burnell Blanks, MD; LAD 99% ISR, CFX okay, OM1 50%, RCA 30%, D1 30% (jailed by the LAD stent), EF 45-50 %   THULIUM LASER TURP (TRANSURETHRAL RESECTION OF PROSTATE) N/A 08/06/2018   Procedure: THULIUM LASER TURP (TRANSURETHRAL RESECTION OF PROSTATE);  Surgeon: Festus Aloe, MD;  Location: [facility];  Service: Urology;  Laterality: N/A;   Social History:  reports that he has never smoked. He has never used smokeless tobacco. He reports that he does not drink alcohol and does not use drugs.  No Known Allergies  Family History  Problem Relation Age of Onset   Lung cancer Father    Cancer Other    Heart disease Neg Hx     Prior to Admission medications   Medication Sig Start Date End Date Taking? Authorizing Provider  albuterol (VENTOLIN HFA) 108 (90 Base) MCG/ACT inhaler Inhale 2 puffs into the lungs every 6 (six) hours as needed for wheezing or shortness of breath. 02/05/23   Nahser, Wonda Cheng, MD  apixaban (ELIQUIS) 5 MG TABS tablet Take 1 tablet (5 mg total) by mouth 2 (two) times daily. 02/05/23   Nahser, Wonda Cheng, MD  atorvastatin (LIPITOR) 80 MG tablet TAKE 1 TABLET BY MOUTH DAILY AT  6 PM 03/21/22   Nahser, Wonda Cheng, MD  azithromycin (ZITHROMAX) 250 MG tablet Take 250 mg by mouth as directed. 02/03/23   [provider]  dorzolamide-timolol (COSOPT) 22.3-6.8 MG/ML ophthalmic solution Place 1 drop into both eyes 2 (two) times daily.     [provider]  latanoprost (XALATAN) 0.005 % ophthalmic solution Place 1 drop into both eyes at bedtime.  Patient not taking: Reported on 02/05/2023    [provider]   lisinopril (ZESTRIL) 5 MG tablet Take 1 tablet (5 mg total) by mouth daily. 04/19/22   Swinyer, Lanice Schwab, NP  Multiple Vitamins-Minerals (ICAPS AREDS FORMULA PO) Take 1 tablet by mouth 2 (two) times daily.     [provider]  nitroGLYCERIN (NITROSTAT) 0.4 MG SL tablet Place 1 tablet (0.4 mg total) under the tongue every 5 (five) minutes as needed. For chest pain. 03/06/22   Swinyer, Lanice Schwab, NP  predniSONE (DELTASONE) 10 MG tablet Take by mouth. 02/03/23   [provider]    Physical Exam: Vitals:   02/08/23 0641 02/08/23 0700 02/08/23 0707 02/08/23 0735  BP:  107/68    Pulse: 82 74 87   Resp: (!) 23 (!) 33 19   Temp:      TempSrc:      SpO2: 96% 92% 95%   Weight:    82.4 kg  Height:    5\' 10"  (1.778 m)  Physical Exam Vitals and nursing note reviewed.  HENT:     Head: Normocephalic.     Nose: No rhinorrhea.     Mouth/Throat:     Mouth: Mucous membranes are moist.  Eyes:     Pupils: Pupils are equal, round, and reactive to light.  Neck:     Vascular: No JVD.  Cardiovascular:     Rate and Rhythm: Normal rate and regular rhythm.  Pulmonary:     Effort: Pulmonary effort is normal. No accessory muscle usage.     Breath sounds: Examination of the right-lower field reveals rales. Examination of the left-lower field reveals rales. Decreased breath sounds, wheezing and rales present. No rhonchi.  Abdominal:     General: Bowel sounds are normal.     Palpations: Abdomen is soft.  Musculoskeletal:     Cervical back: Neck supple.     Right lower leg: No edema.     Left lower leg: No edema.  Skin:    General: Skin is warm and dry.  Neurological:     General: No focal deficit present.     Mental Status: He is alert and oriented to person, place, and time.  Psychiatric:        Mood and Affect: Mood normal.     Data Reviewed:  Results are pending, will review when available.  09/20/2022 echocardiogram IMPRESSIONS    1. Left ventricular ejection  fraction, by estimation, is 60 to 65%. The  left ventricle has normal function. The left ventricle has no regional  wall motion abnormalities. Left ventricular diastolic function could not  be evaluated.   2. Right ventricular systolic function is normal. The right ventricular  size is normal. Tricuspid regurgitation signal is inadequate for assessing  PA pressure.   3. The mitral valve is grossly normal. Mild mitral valve regurgitation.  No evidence of mitral stenosis.   4. The aortic valve is tricuspid. There is moderate calcification of the  aortic valve. There is moderate thickening of the aortic valve. Aortic  valve regurgitation is mild. Mild aortic valve stenosis. Aortic valve  area, by VTI measures 1.41 cm. Aortic  valve mean gradient measures 6.0 mmHg. Aortic valve Vmax measures 1.74  m/s.   5. The inferior vena cava is normal in size with greater than 50%  respiratory variability, suggesting right atrial pressure of 3 mmHg.   Comparison(s): No significant change from prior study.   EKG: Vent. rate 89 BPM PR interval * ms QRS duration 91 ms QT/QTcB 357/435 ms P-R-T axes * 79 22 Atrial fibrillation Low voltage, precordial leads Minimal ST depression, inferior leads  Assessment and Plan: Principal Problem:   Acute respiratory failure with hypoxia (HCC) Symptoms and imaging suspicious for pneumonia. Admit to PCU/inpatient. Continue supplemental oxygen. Scheduled and as needed bronchodilators. Continue ceftriaxone 1 g IVPB daily. Continue azithromycin 500 mg IVPB daily. Check procalcitonin level. Check strep pneumoniae urinary antigen. Check sputum Gram stain, culture and sensitivity. Follow-up blood culture and sensitivity. Follow-up CBC and chemistry in the morning.  Active Problems:   HTN (hypertension) Continue lisinopril 5 mg p.o. daily. Monitor BP, renal function and electrolytes.    CAD (coronary artery disease)  Continue statin, apixaban, ACE and NTG  as needed.   Chronic atrial fibrillation (HCC)    Mixed hyperlipidemia Continue atorvastatin 80 mg p.o. daily.    Hyperbilirubinemia No previous elevation. Recheck bilirubin level in the morning.    Glaucoma Currently not using drops. However he is following with ophthalmology.  Hyperglycemia Check fasting glucose in AM. Further workup depending on results.    Hypokalemia Supplemented. Follow-up potassium level.    Mild protein malnutrition (HCC) Protein supplementation.     Advance Care Planning:   Code Status: Full Code   Consults:   Family Communication:   Severity of Illness: The appropriate patient status for this patient is INPATIENT. Inpatient status is judged to be reasonable and necessary in order to provide the required intensity of service to ensure the patient's safety. The patient's presenting symptoms, physical exam findings, and initial radiographic and laboratory data in the context of their chronic comorbidities is felt to place them at high risk for further clinical deterioration. Furthermore, it is not anticipated that the patient will be medically stable for discharge from the hospital within 2 midnights of admission.   * I certify that at the point of admission it is my clinical judgment that the patient will require inpatient hospital care spanning beyond 2 midnights from the point of admission due to high intensity of service, high risk for further deterioration and high frequency of surveillance required.*  Author: Reubin Milan, MD 02/08/2023 8:03 AM  For on call review [email or web address].   This document was prepared using Dragon voice recognition software and may contain some unintended transcription errors.

## 2023-02-08 NOTE — Plan of Care (Signed)
  Problem: Activity: Goal: Ability to tolerate increased activity will improve Outcome: Progressing   Problem: Clinical Measurements: Goal: Ability to maintain a body temperature in the normal range will improve Outcome: Progressing   Problem: Respiratory: Goal: Ability to maintain adequate ventilation will improve Outcome: Progressing Goal: Ability to maintain a clear airway will improve Outcome: Progressing   Problem: Education: Goal: Knowledge of General Education information will improve Description: Including pain rating scale, medication(s)/side effects and non-pharmacologic comfort measures Outcome: Progressing   Problem: Health Behavior/Discharge Planning: Goal: Ability to manage health-related needs will improve Outcome: Progressing   Problem: Clinical Measurements: Goal: Ability to maintain clinical measurements within normal limits will improve Outcome: Progressing Goal: Will remain free from infection Outcome: Progressing Goal: Diagnostic test results will improve Outcome: Progressing Goal: Respiratory complications will improve Outcome: Progressing Goal: Cardiovascular complication will be avoided Outcome: Progressing   Problem: Activity: Goal: Risk for activity intolerance will decrease Outcome: Progressing   Problem: Nutrition: Goal: Adequate nutrition will be maintained Outcome: Progressing   Problem: Coping: Goal: Level of anxiety will decrease Outcome: Progressing   Problem: Elimination: Goal: Will not experience complications related to bowel motility Outcome: Progressing Goal: Will not experience complications related to urinary retention Outcome: Progressing   Problem: Pain Managment: Goal: General experience of comfort will improve Outcome: Progressing   Problem: Safety: Goal: Ability to remain free from injury will improve Outcome: Progressing   Problem: Skin Integrity: Goal: Risk for impaired skin integrity will decrease Outcome:  Progressing   Problem: Activity: Goal: Ability to tolerate increased activity will improve Outcome: Progressing   Problem: Clinical Measurements: Goal: Ability to maintain a body temperature in the normal range will improve Outcome: Progressing   Problem: Respiratory: Goal: Ability to maintain adequate ventilation will improve Outcome: Progressing Goal: Ability to maintain a clear airway will improve Outcome: Progressing

## 2023-02-08 NOTE — ED Notes (Signed)
Pt laying on stretcher NAD. On 2L [place], on cm sp02 and nibp call bell in reach. Wife at bedside

## 2023-02-09 DIAGNOSIS — J9601 Acute respiratory failure with hypoxia: Secondary | ICD-10-CM | POA: Diagnosis not present

## 2023-02-09 LAB — COMPREHENSIVE METABOLIC PANEL
ALT: 25 U/L (ref 0–44)
AST: 21 U/L (ref 15–41)
Albumin: 2.9 g/dL — ABNORMAL LOW (ref 3.5–5.0)
Alkaline Phosphatase: 83 U/L (ref 38–126)
Anion gap: 9 (ref 5–15)
BUN: 22 mg/dL (ref 8–23)
CO2: 24 mmol/L (ref 22–32)
Calcium: 8.4 mg/dL — ABNORMAL LOW (ref 8.9–10.3)
Chloride: 105 mmol/L (ref 98–111)
Creatinine, Ser: 1.04 mg/dL (ref 0.61–1.24)
GFR, Estimated: >60 mL/min (ref >60)
Glucose, Bld: 111 mg/dL — ABNORMAL HIGH (ref 70–99)
Potassium: 3.4 mmol/L — ABNORMAL LOW (ref 3.5–5.1)
Sodium: 138 mmol/L (ref 135–145)
Total Bilirubin: 1 mg/dL (ref 0.3–1.2)
Total Protein: 6 g/dL — ABNORMAL LOW (ref 6.5–8.1)

## 2023-02-09 LAB — CBC
HCT: 40.1 % (ref 39.0–52.0)
Hemoglobin: 12.5 g/dL — ABNORMAL LOW (ref 13.0–17.0)
MCH: 28 pg (ref 26.0–34.0)
MCHC: 31.2 g/dL (ref 30.0–36.0)
MCV: 89.7 fL (ref 80.0–100.0)
Platelets: 162 10*3/uL (ref 150–400)
RBC: 4.47 MIL/uL (ref 4.22–5.81)
RDW: 14.9 % (ref 11.5–15.5)
WBC: 10.9 10*3/uL — ABNORMAL HIGH (ref 4.0–10.5)
nRBC: 0 % (ref 0.0–0.2)

## 2023-02-09 LAB — MRSA NEXT GEN BY PCR, NASAL: MRSA by PCR Next Gen: NOT DETECTED

## 2023-02-09 LAB — PROCALCITONIN: Procalcitonin: 2.23 ng/mL

## 2023-02-09 MED ORDER — VANCOMYCIN HCL 1750 MG/350ML IV SOLN
1750.0000 mg | Freq: Once | INTRAVENOUS | Status: AC
  Administered 2023-02-09: 1750 mg via INTRAVENOUS
  Filled 2023-02-09: qty 350

## 2023-02-09 MED ORDER — FUROSEMIDE 10 MG/ML IJ SOLN
40.0000 mg | Freq: Once | INTRAMUSCULAR | Status: AC
  Administered 2023-02-09: 40 mg via INTRAVENOUS
  Filled 2023-02-09: qty 4

## 2023-02-09 MED ORDER — SODIUM CHLORIDE 0.9 % IV SOLN
100.0000 mg | Freq: Two times a day (BID) | INTRAVENOUS | Status: DC
  Administered 2023-02-09 – 2023-02-10 (×3): 100 mg via INTRAVENOUS
  Filled 2023-02-09 (×3): qty 100

## 2023-02-09 MED ORDER — LATANOPROST 0.005 % OP SOLN
1.0000 [drp] | Freq: Every day | OPHTHALMIC | Status: DC
  Administered 2023-02-09: 1 [drp] via OPHTHALMIC
  Filled 2023-02-09: qty 2.5

## 2023-02-09 MED ORDER — PIPERACILLIN-TAZOBACTAM 3.375 G IVPB
3.3750 g | Freq: Once | INTRAVENOUS | Status: AC
  Administered 2023-02-09: 3.375 g via INTRAVENOUS
  Filled 2023-02-09: qty 50

## 2023-02-09 MED ORDER — PROSIGHT PO TABS
1.0000 | ORAL_TABLET | Freq: Two times a day (BID) | ORAL | Status: DC
  Administered 2023-02-09 – 2023-02-10 (×3): 1 via ORAL
  Filled 2023-02-09 (×3): qty 1

## 2023-02-09 MED ORDER — DORZOLAMIDE HCL-TIMOLOL MAL 2-0.5 % OP SOLN
1.0000 [drp] | Freq: Two times a day (BID) | OPHTHALMIC | Status: DC
  Administered 2023-02-09 – 2023-02-10 (×3): 1 [drp] via OPHTHALMIC
  Filled 2023-02-09: qty 10

## 2023-02-09 MED ORDER — VANCOMYCIN HCL 1500 MG/300ML IV SOLN: 1500.0000 mg | INTRAVENOUS | Status: DC

## 2023-02-09 MED ORDER — PIPERACILLIN-TAZOBACTAM 3.375 G IVPB
3.3750 g | Freq: Three times a day (TID) | INTRAVENOUS | Status: DC
  Administered 2023-02-09 – 2023-02-10 (×2): 3.375 g via INTRAVENOUS
  Filled 2023-02-09 (×3): qty 50

## 2023-02-09 NOTE — Progress Notes (Signed)
PROGRESS NOTE    Anthony Carpenter  [id number] DOB: 1941/04/18 DOA: 02/07/2023 PCP: Orpah Melter, MD   Brief Narrative:  Anthony Carpenter is a 82 y.o. male with medical history significant of aortic stenosis, bicuspid aortic valve, history of bradycardia, right-sided carotid bruit, CAD, history of MI, glaucoma, hyperlipidemia, hypertension, pulmonary nodule who was diagnosed with pneumonia on Friday and presented to the emergency department with complaints of worsening shortness of breath.  He has been recently diagnosed and treated for pneumonia as an outpatient with prednisone and azithromycin with little to no relief, hospitalist called for admission given patient's failure of outpatient antibiotics, elevated curb 65 score, age and comorbidities.  Assessment & Plan:   Principal Problem:   Acute respiratory failure with hypoxia (HCC) Active Problems:   HTN (hypertension)   Mixed hyperlipidemia   Hyperbilirubinemia   Glaucoma   Hyperglycemia   Hypokalemia   Mild protein malnutrition (HCC)   Chronic atrial fibrillation (HCC)   CAD (coronary artery disease)    Acute hypoxic respiratory failure secondary to community-acquired pneumonia, POA Failure of outpatient antibiotic therapy -Patient failed azithromycin and steroid taper -Broaden antibiotics with vancomycin, Zosyn, doxycycline in the interim.  MRSA swab pending if negative will discontinue vancomycin -Chest x-ray appears to have bibasilar opacities concerning for pulmonary edema, Lasix x 1 -Procalcitonin remains markedly elevated -Continue supplemental oxygen, wean as tolerated -currently on room air which is his baseline -Continue supportive care, nebs, steroids, incentive spirometry, flutter -Cultures remain pending, strep pneumo antigen negative   HTN (hypertension), essential, stable Continue lisinopril 5 mg p.o. daily. At baseline, continue to follow   CAD (coronary artery disease)  Chronic atrial  fibrillation Continue statin, apixaban, ACE inhibitor, patient also on nitroglycerin as needed.     Mixed hyperlipidemia Continue atorvastatin 80 mg p.o. daily.   Hyperbilirubinemia Transiently elevated, resolved with supportive care, okay to continue statin  Glaucoma Currently not using drops, but reports maintaining follow-up with ophthalmology   Hyperglycemia Fasting morning glucose 111, continue to follow - no history of DM No results found for: "HGBA1C"   Hypokalemia Improving - continue to follow   Mild protein malnutrition (HCC) Protein supplementation.  DVT prophylaxis: Eliquis Code Status: Full Family Communication: None present  Status is: Inpatient  Dispo: The patient is from: Home              Anticipated d/c is to: Home              Anticipated d/c date is: 24 to 48 hours pending clinical course              Patient currently not medically stable for discharge  Consultants:  None  Procedures:  None  Antimicrobials:  Vancomycin, Zosyn, doxycycline  Subjective: No acute issues or events overnight respiratory status appears to be improving but not yet back to baseline, otherwise denies nausea vomiting diarrhea constipation any fevers chills or chest pain.  Objective: Vitals:   02/08/23 1247 02/08/23 1900 02/08/23 2303 02/09/23 0303  BP: 109/74 106/60 128/81 132/80  Pulse: 67 74 62 61  Resp: 18  (!) 21   Temp: 97.8 F (36.6 C) 97.8 F (36.6 C) 97.9 F (36.6 C) 98 F (36.7 C)  TempSrc:  Oral Oral Axillary  SpO2: 98% 98% 100% 100%  Weight:      Height:        Intake/Output Summary (Last 24 hours) at 02/09/2023 0748 Last data filed at 02/09/2023 [id number] Gross per 24 hour  Intake 470 ml  Output 1700 ml  Net -1230 ml   Filed Weights   02/08/23 0735 02/08/23 0900  Weight: 82.4 kg 80.7 kg    Examination:  General:  Pleasantly resting in bed, No acute distress. HEENT:  Normocephalic atraumatic.  Sclerae nonicteric, noninjected.  Extraocular  movements intact bilaterally. Neck:  Without mass or deformity.  Trachea is midline. Lungs: Bibasilar rales without overt wheeze or rhonchi. Heart:  Regular rate and rhythm.  Without murmurs, rubs, or gallops. Abdomen:  Soft, nontender, nondistended.  Without guarding or rebound. Extremities: Without cyanosis, clubbing, edema, or obvious deformity. Vascular:  Dorsalis pedis and posterior tibial pulses palpable bilaterally. Skin:  Warm and dry, no erythema   Data Reviewed: I have personally reviewed following labs and imaging studies  CBC: Recent Labs  Lab 02/08/23 0007 02/09/23 0533  WBC 8.5 10.9*  NEUTROABS 6.6  --   HGB 13.4 12.5*  HCT 42.2 40.1  MCV 88.1 89.7  PLT 169 0000000   Basic Metabolic Panel: Recent Labs  Lab 02/08/23 0007 02/08/23 0946 02/09/23 0533  NA 135 139 138  K 3.3* 3.5 3.4*  CL 104 102 105  CO2 21* 25 24  GLUCOSE 177* 169* 111*  BUN 21 19 22   CREATININE 1.14 1.15 1.04  CALCIUM 8.3* 8.4* 8.4*   GFR: Estimated Creatinine Clearance: 56.5 mL/min (by C-G formula based on SCr of 1.04 mg/dL). Liver Function Tests: Recent Labs  Lab 02/08/23 0007 02/09/23 0533  AST 37 21  ALT 34 25  ALKPHOS 93 83  BILITOT 1.7* 1.0  PROT 6.4* 6.0*  ALBUMIN 3.4* 2.9*   No results for input(s): "LIPASE", "AMYLASE" in the last 168 hours. No results for input(s): "AMMONIA" in the last 168 hours. Coagulation Profile: Recent Labs  Lab 02/08/23 0007  INR 1.7*   Sepsis Labs: Recent Labs  Lab 02/08/23 0007 02/08/23 0946  PROCALCITON  --  2.40  LATICACIDVEN 1.9  --     Recent Results (from the past 240 hour(s))  Resp panel by RT-PCR (RSV, Flu A&B, Covid) Anterior Nasal Swab     Status: None   Collection Time: 02/08/23 12:07 AM   Specimen: Anterior Nasal Swab  Result Value Ref Range Status   SARS Coronavirus 2 by RT PCR NEGATIVE NEGATIVE Final    Comment: (NOTE) SARS-CoV-2 target nucleic acids are NOT DETECTED.  The SARS-CoV-2 RNA is generally detectable in  upper respiratory specimens during the acute phase of infection. The lowest concentration of SARS-CoV-2 viral copies this assay can detect is 138 copies/mL. A negative result does not preclude SARS-Cov-2 infection and should not be used as the sole basis for treatment or other patient management decisions. A negative result may occur with  improper specimen collection/handling, submission of specimen other than nasopharyngeal swab, presence of viral mutation(s) within the areas targeted by this assay, and inadequate number of viral copies(<138 copies/mL). A negative result must be combined with clinical observations, patient history, and epidemiological information. The expected result is Negative.  Fact Sheet for Patients:  EntrepreneurPulse.com.au  Fact Sheet for Healthcare Providers:  IncredibleEmployment.be  This test is no t yet approved or cleared by the [place] FDA and  has been authorized for detection and/or diagnosis of SARS-CoV-2 by FDA under an Emergency Use Authorization (EUA). This EUA will remain  in effect (meaning this test can be used) for the duration of the COVID-19 declaration under Section 564(b)(1) of the Act, 21 U.S.C.section 360bbb-3(b)(1), unless the authorization is terminated  or revoked sooner.  Influenza A by PCR NEGATIVE NEGATIVE Final   Influenza B by PCR NEGATIVE NEGATIVE Final    Comment: (NOTE) The Xpert Xpress SARS-CoV-2/FLU/RSV plus assay is intended as an aid in the diagnosis of influenza from Nasopharyngeal swab specimens and should not be used as a sole basis for treatment. Nasal washings and aspirates are unacceptable for Xpert Xpress SARS-CoV-2/FLU/RSV testing.  Fact Sheet for Patients: EntrepreneurPulse.com.au  Fact Sheet for Healthcare Providers: IncredibleEmployment.be  This test is not yet approved or cleared by the [place] FDA and has been  authorized for detection and/or diagnosis of SARS-CoV-2 by FDA under an Emergency Use Authorization (EUA). This EUA will remain in effect (meaning this test can be used) for the duration of the COVID-19 declaration under Section 564(b)(1) of the Act, 21 U.S.C. section 360bbb-3(b)(1), unless the authorization is terminated or revoked.     Resp Syncytial Virus by PCR NEGATIVE NEGATIVE Final    Comment: (NOTE) Fact Sheet for Patients: EntrepreneurPulse.com.au  Fact Sheet for Healthcare Providers: IncredibleEmployment.be  This test is not yet approved or cleared by the [place] FDA and has been authorized for detection and/or diagnosis of SARS-CoV-2 by FDA under an Emergency Use Authorization (EUA). This EUA will remain in effect (meaning this test can be used) for the duration of the COVID-19 declaration under Section 564(b)(1) of the Act, 21 U.S.C. section 360bbb-3(b)(1), unless the authorization is terminated or revoked.  Performed at [facility], [place] [address] [place], [place] [address]   Expectorated Sputum Assessment w Gram Stain, Rflx to Resp Cult     Status: None   Collection Time: 02/08/23  8:35 AM   Specimen: Expectorated Sputum  Result Value Ref Range Status   Specimen Description EXPECTORATED SPUTUM  Final   Special Requests NONE  Final   Sputum evaluation   Final    THIS SPECIMEN IS ACCEPTABLE FOR SPUTUM CULTURE Performed at [facility], [place] [address] [place], [place] [address]    Report Status 02/08/2023 FINAL  Final  Culture, Respiratory w Gram Stain     Status: None (Preliminary result)   Collection Time: 02/08/23  8:35 AM  Result Value Ref Range Status   Specimen Description   Final    EXPECTORATED SPUTUM Performed at [facility] [address], [place], [place] [address]    Special Requests   Final    NONE Reflexed from [id number] Performed at [facility], [place] [address], [place], [place] [address]    Gram Stain   Final    RARE WBC PRESENT, PREDOMINANTLY PMN FEW GRAM POSITIVE COCCI RARE GRAM NEGATIVE RODS Performed at [facility], [place] [address] [place], [place] [address]    Culture PENDING  Incomplete   Report Status PENDING  Incomplete    Radiology Studies: DG Chest Port 1 View  Result Date: 02/08/2023 CLINICAL DATA:  Hypoxia. EXAM: PORTABLE CHEST 1 VIEW COMPARISON:  Chest radiograph dated 12/04/2018. FINDINGS: Cardiomegaly with vascular congestion and edema. Bilateral mid to lower lung field airspace disease may represent edema. Pneumonia is not excluded clinical correlation is recommended. Coronary vascular calcification or stent. No acute osseous pathology. IMPRESSION: Cardiomegaly with vascular congestion and edema. Pneumonia is not excluded. Electronically Signed   By: Anner Crete M.D.   On: 02/08/2023 00:39    Scheduled Meds:  apixaban  5 mg Oral BID   atorvastatin  80 mg Oral q1800   lisinopril  5 mg Oral Daily   Continuous  Infusions:  azithromycin 500 mg (02/09/23 0043)   cefTRIAXone (ROCEPHIN)  IV 2 g (02/09/23 0009)     LOS: 1 day   Time spent: 80min  Calea Hribar C Rashanna Christiana, DO [facility]  If 7PM-7AM, please contact night-coverage [email or web address]  02/09/2023, 7:48 AM

## 2023-02-09 NOTE — Progress Notes (Signed)
  Transition of Care [facility]) Screening Note   Patient Details  Name: TARL MARKSBERRY Date of Birth: [birth year]   [facility] CM/SW Contact:    Dessa Phi, RN Phone Number: 02/09/2023, 3:52 PM    Transition of Care Department [facility]) has reviewed patient and no TOC needs have been identified at this time. We will continue to monitor patient advancement through interdisciplinary progression rounds. If new patient transition needs arise, please place a TOC consult.

## 2023-02-09 NOTE — Progress Notes (Signed)
Pharmacy Antibiotic Note  Anthony Carpenter is a 82 y.o. male admitted on 02/07/2023 with pneumonia.  Pharmacy has been consulted for vancomycin & zosyn dosing.  Plan: Zosyn 3.375g IV q8h (4 hour infusion). Doxy 100 mg IV q12 per MD for failed azithromycin Vancomycin 1750 mg IV x 1 followed by Vancomycin 1500 mg IV q24 for est AUC 502.9 using SCr 1.04, est Css min 11.5 Daily SCr while on Zosyn/Vanc combination DC Vanc if MRSA PCR neg per MD orders  Height: 5\' 10"  (177.8 cm) Weight: 80.7 kg (177 lb 14.4 oz) IBW/kg (Calculated) : 73  Temp (24hrs), Avg:98 F (36.7 C), Min:97.8 F (36.6 C), Max:98.8 F (37.1 C)  Recent Labs  Lab 02/08/23 0007 02/08/23 0946 02/09/23 0533  WBC 8.5  --  10.9*  CREATININE 1.14 1.15 1.04  LATICACIDVEN 1.9  --   --     Estimated Creatinine Clearance: 56.5 mL/min (by C-G formula based on SCr of 1.04 mg/dL).    No Known Allergies  Antimicrobials this admission: 3/21 azith>> 3/22 3/21 CTX>> 3/22 3/22 Vanc>> 3/22 Zosyn>> 3/22 doxy>> Dose adjustments this admission:  Microbiology results: 3/21 strep pneumo neg 3/21 sputum: few GPC & rare GNR on gram stain  Thank you for allowing pharmacy to be a part of this patient's care.  Eudelia Bunch, Pharm.D Use secure chat for questions 02/09/2023 8:20 AM

## 2023-02-09 NOTE — Progress Notes (Signed)
Mobility Specialist - Progress Note   02/09/23 0958  Mobility  Activity Ambulated with assistance in hallway  Level of Assistance Standby assist, set-up cues, supervision of patient - no hands on  Assistive Device None  Distance Ambulated (ft) 500 ft  Activity Response Tolerated well  Mobility Referral Yes  $Mobility charge 1 Mobility   Pt received in chair and agreed to mobility.  Nurse requested Mobility Specialist to perform oxygen saturation test with pt which includes removing pt from oxygen both at rest and while ambulating.  Below are the results from that testing.     Patient Saturations on Room Air at Rest = spO2 95%  Patient Saturations on Room Air while Ambulating = sp02 93% .    At end of testing pt left in room on RA.  Reported results to nurse.   Pt returned to chair with all needs met.  Anthony Carpenter Mobility Specialist

## 2023-02-09 NOTE — Progress Notes (Signed)
Received report from previous nurse. Agree with previous assessment. Patient in NAD, wife at bedside, call light within reach, and bed alarm engaged. Will continue with plan of care.

## 2023-02-10 DIAGNOSIS — J9601 Acute respiratory failure with hypoxia: Secondary | ICD-10-CM | POA: Diagnosis not present

## 2023-02-10 LAB — BASIC METABOLIC PANEL
Anion gap: 9 (ref 5–15)
BUN: 20 mg/dL (ref 8–23)
CO2: 26 mmol/L (ref 22–32)
Calcium: 8.3 mg/dL — ABNORMAL LOW (ref 8.9–10.3)
Chloride: 101 mmol/L (ref 98–111)
Creatinine, Ser: 1.03 mg/dL (ref 0.61–1.24)
GFR, Estimated: >60 mL/min (ref >60)
Glucose, Bld: 88 mg/dL (ref 70–99)
Potassium: 3.5 mmol/L (ref 3.5–5.1)
Sodium: 136 mmol/L (ref 135–145)

## 2023-02-10 LAB — CBC
HCT: 41.7 % (ref 39.0–52.0)
Hemoglobin: 13.5 g/dL (ref 13.0–17.0)
MCH: 28.6 pg (ref 26.0–34.0)
MCHC: 32.4 g/dL (ref 30.0–36.0)
MCV: 88.3 fL (ref 80.0–100.0)
Platelets: 185 10*3/uL (ref 150–400)
RBC: 4.72 MIL/uL (ref 4.22–5.81)
RDW: 14.6 % (ref 11.5–15.5)
WBC: 10.2 10*3/uL (ref 4.0–10.5)
nRBC: 0 % (ref 0.0–0.2)

## 2023-02-10 MED ORDER — DOXYCYCLINE HYCLATE 50 MG PO CAPS: 50.0000 mg | ORAL_CAPSULE | Freq: Two times a day (BID) | ORAL | 0 refills | Status: DC

## 2023-02-10 MED ORDER — DOXYCYCLINE MONOHYDRATE 100 MG PO TABS: 100.0000 mg | ORAL_TABLET | Freq: Two times a day (BID) | ORAL | 0 refills | Status: AC

## 2023-02-10 MED ORDER — DOXYCYCLINE MONOHYDRATE 100 MG PO TABS: 100.0000 mg | ORAL_TABLET | Freq: Two times a day (BID) | ORAL | 0 refills | Status: DC

## 2023-02-10 NOTE — Progress Notes (Signed)
Mobility Specialist - Progress Note   02/10/23 1007  Oxygen Therapy  SpO2 97 %  O2 Device Room Air  Patient Activity (if Appropriate) Ambulating  Mobility  Activity Ambulated independently in hallway  Level of Assistance Independent  Assistive Device None  Distance Ambulated (ft) 430 ft  Activity Response Tolerated well  Mobility Referral Yes  $Mobility charge 1 Mobility   Nurse requested Mobility Specialist to perform oxygen saturation test with pt which includes removing pt from oxygen both at rest and while ambulating.  Below are the results from that testing.     Patient Saturations on Room Air at Rest = spO2 97%  Patient Saturations on Room Air while Ambulating = sp02 97% .    Patient Saturations on 0 Liters of oxygen while Ambulating = sp02 97%  At end of testing pt left in room on 0  Liters of oxygen.  Reported results to nurse. Pt received in bed and agreeable to mobility. No complaints during session. Pt to EOB after session with all needs met.    Pre-mobility: 59 HR, 97% SpO2 During mobility: 69 HR, 97% SpO2 Post-mobility: 74 HR, 96% SPO2  Set designer

## 2023-02-10 NOTE — Discharge Summary (Signed)
Physician Discharge Summary  Anthony Carpenter [id number] DOB: 13-Jun-1941 DOA: 02/07/2023  PCP: Orpah Melter, MD  Admit date: 02/07/2023 Discharge date: 02/10/2023  Admitted From: Home Disposition: Home  Recommendations for Outpatient Follow-up:  Follow up with PCP in 1-2 weeks  Home Health: None Equipment/Devices: None  Discharge Condition: Stable CODE STATUS: Full Diet recommendation: Low-salt low-fat diet  Brief/Interim Summary: Anthony Carpenter is a 82 y.o. male with medical history significant of aortic stenosis, bicuspid aortic valve, history of bradycardia, right-sided carotid bruit, CAD, history of MI, glaucoma, hyperlipidemia, hypertension, pulmonary nodule who was diagnosed with pneumonia on Friday and presented to the emergency department with complaints of worsening shortness of breath.  He has been recently diagnosed and treated for pneumonia as an outpatient with prednisone and azithromycin with little to no relief, hospitalist called for admission given patient's failure of outpatient antibiotics, elevated curb 65 score, age and comorbidities.   Patient improved drastically, much quicker than expected, able to ambulate today without any symptoms or hypoxia, otherwise stable for discharge home.  Will continue doxycycline at discharge for additional coverage given previous failure of antibiotic therapy.  Discussed at bedside with wife and daughter.  Discharge Diagnoses:  Principal Problem:   Acute respiratory failure with hypoxia (HCC) Active Problems:   HTN (hypertension)   Mixed hyperlipidemia   Hyperbilirubinemia   Glaucoma   Hyperglycemia   Hypokalemia   Mild protein malnutrition (HCC)   Chronic atrial fibrillation (HCC)   CAD (coronary artery disease)  Acute hypoxic respiratory failure secondary to community-acquired pneumonia, POA Failure of outpatient antibiotic therapy - Improving - as above   HTN (hypertension), essential, stable Continue lisinopril 5 mg  p.o. daily.  CAD (coronary artery disease)  Chronic atrial fibrillation Continue statin, apixaban, ACE inhibitor, patient also on nitroglycerin as needed.     Mixed hyperlipidemia Continue atorvastatin 80 mg p.o. daily.   Hyperbilirubinemia Transiently elevated, resolved with supportive care, okay to continue statin   Glaucoma Currently not using drops, but reports maintaining follow-up with ophthalmology   Hyperglycemia Fasting morning glucose 111, continue to follow - no history of DM No results found for: "HGBA1C"   Hypokalemia Resolved   Mild protein malnutrition (HCC) Protein supplementation.  Discharge Instructions  Discharge Instructions     Discharge patient   Complete by: As directed    Discharge disposition: 01-Home or Self Care   Discharge patient date: 02/10/2023      Allergies as of 02/10/2023   No Known Allergies      Medication List     STOP taking these medications    azithromycin 250 MG tablet Commonly known as: ZITHROMAX   predniSONE 10 MG tablet Commonly known as: DELTASONE       TAKE these medications    albuterol 108 (90 Base) MCG/ACT inhaler Commonly known as: VENTOLIN HFA Inhale 2 puffs into the lungs every 6 (six) hours as needed for wheezing or shortness of breath.   apixaban 5 MG Tabs tablet Commonly known as: ELIQUIS Take 1 tablet (5 mg total) by mouth 2 (two) times daily.   atorvastatin 80 MG tablet Commonly known as: LIPITOR TAKE 1 TABLET BY MOUTH DAILY AT  6 PM What changed:  how much to take how to take this when to take this additional instructions   dorzolamide-timolol 2-0.5 % ophthalmic solution Commonly known as: COSOPT Place 1 drop into both eyes 2 (two) times daily.   doxycycline 100 MG tablet Commonly known as: ADOXA Take 1 tablet (100 mg total)  by mouth 2 (two) times daily for 4 days.   ICAPS AREDS FORMULA PO Take 1 tablet by mouth 2 (two) times daily.   latanoprost 0.005 % ophthalmic  solution Commonly known as: XALATAN Place 1 drop into both eyes at bedtime.   lisinopril 5 MG tablet Commonly known as: ZESTRIL Take 1 tablet (5 mg total) by mouth daily.   nitroGLYCERIN 0.4 MG SL tablet Commonly known as: NITROSTAT Place 1 tablet (0.4 mg total) under the tongue every 5 (five) minutes as needed. For chest pain.        No Known Allergies  Consultations: None   Procedures/Studies: DG Chest Port 1 View  Result Date: 02/08/2023 CLINICAL DATA:  Hypoxia. EXAM: PORTABLE CHEST 1 VIEW COMPARISON:  Chest radiograph dated 12/04/2018. FINDINGS: Cardiomegaly with vascular congestion and edema. Bilateral mid to lower lung field airspace disease may represent edema. Pneumonia is not excluded clinical correlation is recommended. Coronary vascular calcification or stent. No acute osseous pathology. IMPRESSION: Cardiomegaly with vascular congestion and edema. Pneumonia is not excluded. Electronically Signed   By: Anner Crete M.D.   On: 02/08/2023 00:39     Subjective: No acute issues/events overnight   Discharge Exam: Vitals:   02/10/23 0500 02/10/23 1007  BP:    Pulse:    Resp: 20   Temp:    SpO2:  97%   Vitals:   02/10/23 0400 02/10/23 0446 02/10/23 0500 02/10/23 1007  BP:  101/62    Pulse:  66    Resp: 20 18 20    Temp:  98.2 F (36.8 C)    TempSrc:  Oral    SpO2:  97%  97%  Weight:      Height:        General: Pt is alert, awake, not in acute distress Cardiovascular: RRR, S1/S2 +, no rubs, no gallops Respiratory: CTA bilaterally, no wheezing, no rhonchi Abdominal: Soft, NT, ND, bowel sounds + Extremities: no edema, no cyanosis    The results of significant diagnostics from this hospitalization (including imaging, microbiology, ancillary and laboratory) are listed below for reference.     Microbiology: Recent Results (from the past 240 hour(s))  Resp panel by RT-PCR (RSV, Flu A&B, Covid) Anterior Nasal Swab     Status: None   Collection Time:  02/08/23 12:07 AM   Specimen: Anterior Nasal Swab  Result Value Ref Range Status   SARS Coronavirus 2 by RT PCR NEGATIVE NEGATIVE Final    Comment: (NOTE) SARS-CoV-2 target nucleic acids are NOT DETECTED.  The SARS-CoV-2 RNA is generally detectable in upper respiratory specimens during the acute phase of infection. The lowest concentration of SARS-CoV-2 viral copies this assay can detect is 138 copies/mL. A negative result does not preclude SARS-Cov-2 infection and should not be used as the sole basis for treatment or other patient management decisions. A negative result may occur with  improper specimen collection/handling, submission of specimen other than nasopharyngeal swab, presence of viral mutation(s) within the areas targeted by this assay, and inadequate number of viral copies(<138 copies/mL). A negative result must be combined with clinical observations, patient history, and epidemiological information. The expected result is Negative.  Fact Sheet for Patients:  EntrepreneurPulse.com.au  Fact Sheet for Healthcare Providers:  IncredibleEmployment.be  This test is no t yet approved or cleared by the [place] FDA and  has been authorized for detection and/or diagnosis of SARS-CoV-2 by FDA under an Emergency Use Authorization (EUA). This EUA will remain  in effect (meaning this test can be  used) for the duration of the COVID-19 declaration under Section 564(b)(1) of the Act, 21 U.S.C.section 360bbb-3(b)(1), unless the authorization is terminated  or revoked sooner.       Influenza A by PCR NEGATIVE NEGATIVE Final   Influenza B by PCR NEGATIVE NEGATIVE Final    Comment: (NOTE) The Xpert Xpress SARS-CoV-2/FLU/RSV plus assay is intended as an aid in the diagnosis of influenza from Nasopharyngeal swab specimens and should not be used as a sole basis for treatment. Nasal washings and aspirates are unacceptable for Xpert Xpress  SARS-CoV-2/FLU/RSV testing.  Fact Sheet for Patients: EntrepreneurPulse.com.au  Fact Sheet for Healthcare Providers: IncredibleEmployment.be  This test is not yet approved or cleared by the [place] FDA and has been authorized for detection and/or diagnosis of SARS-CoV-2 by FDA under an Emergency Use Authorization (EUA). This EUA will remain in effect (meaning this test can be used) for the duration of the COVID-19 declaration under Section 564(b)(1) of the Act, 21 U.S.C. section 360bbb-3(b)(1), unless the authorization is terminated or revoked.     Resp Syncytial Virus by PCR NEGATIVE NEGATIVE Final    Comment: (NOTE) Fact Sheet for Patients: EntrepreneurPulse.com.au  Fact Sheet for Healthcare Providers: IncredibleEmployment.be  This test is not yet approved or cleared by the [place] FDA and has been authorized for detection and/or diagnosis of SARS-CoV-2 by FDA under an Emergency Use Authorization (EUA). This EUA will remain in effect (meaning this test can be used) for the duration of the COVID-19 declaration under Section 564(b)(1) of the Act, 21 U.S.C. section 360bbb-3(b)(1), unless the authorization is terminated or revoked.  Performed at [facility], [place] [address], [place], [place] [address]   Expectorated Sputum Assessment w Gram Stain, Rflx to Resp Cult     Status: None   Collection Time: 02/08/23  8:35 AM   Specimen: Expectorated Sputum  Result Value Ref Range Status   Specimen Description EXPECTORATED SPUTUM  Final   Special Requests NONE  Final   Sputum evaluation   Final    THIS SPECIMEN IS ACCEPTABLE FOR SPUTUM CULTURE Performed at [facility], [place] [address], [place], [place] [address]    Report Status 02/08/2023 FINAL  Final  Culture, Respiratory w Gram Stain     Status: None (Preliminary result)   Collection Time: 02/08/23  8:35 AM   Result Value Ref Range Status   Specimen Description   Final    EXPECTORATED SPUTUM Performed at [place] [address], [place], [place] [address]    Special Requests   Final    NONE Reflexed from [id number] Performed at [facility], [place] [address], [place], [place] [address]    Gram Stain   Final    RARE WBC PRESENT, PREDOMINANTLY PMN FEW GRAM POSITIVE COCCI RARE GRAM NEGATIVE RODS    Culture   Final    FEW Normal respiratory flora-no Staph aureus or Pseudomonas seen Performed at [facility], [place] [address], [place], [place] [address]    Report Status PENDING  Incomplete  MRSA Next Gen by PCR, Nasal     Status: None   Collection Time: 02/09/23  9:22 AM   Specimen: Nasal Mucosa; Nasal Swab  Result Value Ref Range Status   MRSA by PCR Next Gen NOT DETECTED NOT DETECTED Final    Comment: (NOTE) The GeneXpert MRSA Assay (FDA approved for NASAL specimens only), is one component of a comprehensive MRSA colonization surveillance program. It is not intended  to diagnose MRSA infection nor to guide or monitor treatment for MRSA infections. Test performance is not FDA approved in patients less than 65 years old. Performed at [facility], [place] [address] [place], [place] [address]      Labs: BNP (last 3 results) Recent Labs    02/08/23 0007  BNP [id number]   Basic Metabolic Panel: Recent Labs  Lab 02/08/23 0007 02/08/23 0946 02/09/23 0533 02/10/23 0448  NA 135 139 138 136  K 3.3* 3.5 3.4* 3.5  CL 104 102 105 101  CO2 21* 25 24 26   GLUCOSE 177* 169* 111* 88  BUN 21 19 22 20   CREATININE 1.14 1.15 1.04 1.03  CALCIUM 8.3* 8.4* 8.4* 8.3*   Liver Function Tests: Recent Labs  Lab 02/08/23 0007 02/09/23 0533  AST 37 21  ALT 34 25  ALKPHOS 93 83  BILITOT 1.7* 1.0  PROT 6.4* 6.0*  ALBUMIN 3.4* 2.9*   No results for input(s): "LIPASE", "AMYLASE" in the last 168 hours. No results for input(s): "AMMONIA"  in the last 168 hours. CBC: Recent Labs  Lab 02/08/23 0007 02/09/23 0533 02/10/23 0448  WBC 8.5 10.9* 10.2  NEUTROABS 6.6  --   --   HGB 13.4 12.5* 13.5  HCT 42.2 40.1 41.7  MCV 88.1 89.7 88.3  PLT 169 162 185   Cardiac Enzymes: No results for input(s): "CKTOTAL", "CKMB", "CKMBINDEX", "TROPONINI" in the last 168 hours. BNP: Invalid input(s): "POCBNP" CBG: No results for input(s): "GLUCAP" in the last 168 hours. D-Dimer No results for input(s): "DDIMER" in the last 72 hours. Hgb A1c No results for input(s): "HGBA1C" in the last 72 hours. Lipid Profile No results for input(s): "CHOL", "HDL", "LDLCALC", "TRIG", "CHOLHDL", "LDLDIRECT" in the last 72 hours. Thyroid function studies No results for input(s): "TSH", "T4TOTAL", "T3FREE", "THYROIDAB" in the last 72 hours.  Invalid input(s): "FREET3" Anemia work up No results for input(s): "VITAMINB12", "FOLATE", "FERRITIN", "TIBC", "IRON", "RETICCTPCT" in the last 72 hours. Urinalysis    Component Value Date/Time   COLORURINE YELLOW 03/11/2012 1420   APPEARANCEUR CLEAR 03/11/2012 1420   LABSPEC 1.021 03/11/2012 1420   PHURINE 6.0 03/11/2012 1420   GLUCOSEU NEGATIVE 03/11/2012 1420   HGBUR NEGATIVE 03/11/2012 1420   BILIRUBINUR NEGATIVE 03/11/2012 1420   KETONESUR NEGATIVE 03/11/2012 1420   PROTEINUR NEGATIVE 03/11/2012 1420   UROBILINOGEN 1.0 03/11/2012 1420   NITRITE NEGATIVE 03/11/2012 1420   LEUKOCYTESUR NEGATIVE 03/11/2012 1420   Sepsis Labs Recent Labs  Lab 02/08/23 0007 02/09/23 0533 02/10/23 0448  WBC 8.5 10.9* 10.2   Microbiology Recent Results (from the past 240 hour(s))  Resp panel by RT-PCR (RSV, Flu A&B, Covid) Anterior Nasal Swab     Status: None   Collection Time: 02/08/23 12:07 AM   Specimen: Anterior Nasal Swab  Result Value Ref Range Status   SARS Coronavirus 2 by RT PCR NEGATIVE NEGATIVE Final    Comment: (NOTE) SARS-CoV-2 target nucleic acids are NOT DETECTED.  The SARS-CoV-2 RNA is generally  detectable in upper respiratory specimens during the acute phase of infection. The lowest concentration of SARS-CoV-2 viral copies this assay can detect is 138 copies/mL. A negative result does not preclude SARS-Cov-2 infection and should not be used as the sole basis for treatment or other patient management decisions. A negative result may occur with  improper specimen collection/handling, submission of specimen other than nasopharyngeal swab, presence of viral mutation(s) within the areas targeted by this assay, and inadequate number of viral copies(<138 copies/mL). A  negative result must be combined with clinical observations, patient history, and epidemiological information. The expected result is Negative.  Fact Sheet for Patients:  EntrepreneurPulse.com.au  Fact Sheet for Healthcare Providers:  IncredibleEmployment.be  This test is no t yet approved or cleared by the [place] FDA and  has been authorized for detection and/or diagnosis of SARS-CoV-2 by FDA under an Emergency Use Authorization (EUA). This EUA will remain  in effect (meaning this test can be used) for the duration of the COVID-19 declaration under Section 564(b)(1) of the Act, 21 U.S.C.section 360bbb-3(b)(1), unless the authorization is terminated  or revoked sooner.       Influenza A by PCR NEGATIVE NEGATIVE Final   Influenza B by PCR NEGATIVE NEGATIVE Final    Comment: (NOTE) The Xpert Xpress SARS-CoV-2/FLU/RSV plus assay is intended as an aid in the diagnosis of influenza from Nasopharyngeal swab specimens and should not be used as a sole basis for treatment. Nasal washings and aspirates are unacceptable for Xpert Xpress SARS-CoV-2/FLU/RSV testing.  Fact Sheet for Patients: EntrepreneurPulse.com.au  Fact Sheet for Healthcare Providers: IncredibleEmployment.be  This test is not yet approved or cleared by the [place] FDA  and has been authorized for detection and/or diagnosis of SARS-CoV-2 by FDA under an Emergency Use Authorization (EUA). This EUA will remain in effect (meaning this test can be used) for the duration of the COVID-19 declaration under Section 564(b)(1) of the Act, 21 U.S.C. section 360bbb-3(b)(1), unless the authorization is terminated or revoked.     Resp Syncytial Virus by PCR NEGATIVE NEGATIVE Final    Comment: (NOTE) Fact Sheet for Patients: EntrepreneurPulse.com.au  Fact Sheet for Healthcare Providers: IncredibleEmployment.be  This test is not yet approved or cleared by the [place] FDA and has been authorized for detection and/or diagnosis of SARS-CoV-2 by FDA under an Emergency Use Authorization (EUA). This EUA will remain in effect (meaning this test can be used) for the duration of the COVID-19 declaration under Section 564(b)(1) of the Act, 21 U.S.C. section 360bbb-3(b)(1), unless the authorization is terminated or revoked.  Performed at [facility], [place] [address] [place], [place] [address]   Expectorated Sputum Assessment w Gram Stain, Rflx to Resp Cult     Status: None   Collection Time: 02/08/23  8:35 AM   Specimen: Expectorated Sputum  Result Value Ref Range Status   Specimen Description EXPECTORATED SPUTUM  Final   Special Requests NONE  Final   Sputum evaluation   Final    THIS SPECIMEN IS ACCEPTABLE FOR SPUTUM CULTURE Performed at [facility], [place] [address], [place], [place] [address]    Report Status 02/08/2023 FINAL  Final  Culture, Respiratory w Gram Stain     Status: None (Preliminary result)   Collection Time: 02/08/23  8:35 AM  Result Value Ref Range Status   Specimen Description   Final    EXPECTORATED SPUTUM Performed at [place] [address], [place], [place] [address]    Special Requests   Final    NONE Reflexed from [id number] Performed at  [facility], [place] [address] [place], [place] [address]    Gram Stain   Final    RARE WBC PRESENT, PREDOMINANTLY PMN FEW GRAM POSITIVE COCCI RARE GRAM NEGATIVE RODS    Culture   Final    FEW Normal respiratory flora-no Staph aureus or Pseudomonas seen Performed at [facility], [place] [address] [place], [place] [address]    Report  Status PENDING  Incomplete  MRSA Next Gen by PCR, Nasal     Status: None   Collection Time: 02/09/23  9:22 AM   Specimen: Nasal Mucosa; Nasal Swab  Result Value Ref Range Status   MRSA by PCR Next Gen NOT DETECTED NOT DETECTED Final    Comment: (NOTE) The GeneXpert MRSA Assay (FDA approved for NASAL specimens only), is one component of a comprehensive MRSA colonization surveillance program. It is not intended to diagnose MRSA infection nor to guide or monitor treatment for MRSA infections. Test performance is not FDA approved in patients less than 69 years old. Performed at [facility], [place] [address] [place], [place] [address]      Time coordinating discharge: Over 30 minutes  SIGNED:   Little Ishikawa, DO [facility] 02/10/2023, 11:41 AM Pager   If 7PM-7AM, please contact night-coverage [email or web address]

## 2023-02-11 LAB — CULTURE, RESPIRATORY W GRAM STAIN: Culture: NORMAL

## 2023-02-21 ENCOUNTER — Other Ambulatory Visit: Payer: Self-pay | Admitting: Cardiovascular Disease

## 2023-02-21 ENCOUNTER — Encounter (HOSPITAL_COMMUNITY): Payer: Self-pay | Admitting: Cardiology

## 2023-02-21 ENCOUNTER — Other Ambulatory Visit: Payer: Self-pay

## 2023-02-21 ENCOUNTER — Encounter (HOSPITAL_COMMUNITY)
Admission: RE | Disposition: A | Discharge status: Home / Self Care | Payer: Self-pay | Source: Home / Self Care | Attending: Cardiology

## 2023-02-21 ENCOUNTER — Ambulatory Visit (HOSPITAL_COMMUNITY): Payer: Medicare Other | Admitting: Certified Registered Nurse Anesthetist

## 2023-02-21 ENCOUNTER — Ambulatory Visit (HOSPITAL_BASED_OUTPATIENT_CLINIC_OR_DEPARTMENT_OTHER): Payer: Medicare Other | Admitting: Certified Registered Nurse Anesthetist

## 2023-02-21 ENCOUNTER — Ambulatory Visit (HOSPITAL_COMMUNITY)
Admission: RE | Admit: 2023-02-21 | Discharge: 2023-02-21 | Disposition: A | Discharge status: Home / Self Care | Payer: Medicare Other | Attending: Cardiology | Admitting: Cardiology

## 2023-02-21 DIAGNOSIS — R5383 Other fatigue: Secondary | ICD-10-CM | POA: Diagnosis not present

## 2023-02-21 DIAGNOSIS — Z955 Presence of coronary angioplasty implant and graft: Secondary | ICD-10-CM | POA: Diagnosis not present

## 2023-02-21 DIAGNOSIS — J069 Acute upper respiratory infection, unspecified: Secondary | ICD-10-CM | POA: Insufficient documentation

## 2023-02-21 DIAGNOSIS — I252 Old myocardial infarction: Secondary | ICD-10-CM | POA: Insufficient documentation

## 2023-02-21 DIAGNOSIS — Z7901 Long term (current) use of anticoagulants: Secondary | ICD-10-CM | POA: Insufficient documentation

## 2023-02-21 DIAGNOSIS — I10 Essential (primary) hypertension: Secondary | ICD-10-CM | POA: Insufficient documentation

## 2023-02-21 DIAGNOSIS — I2510 Atherosclerotic heart disease of native coronary artery without angina pectoris: Secondary | ICD-10-CM

## 2023-02-21 DIAGNOSIS — E785 Hyperlipidemia, unspecified: Secondary | ICD-10-CM | POA: Diagnosis not present

## 2023-02-21 DIAGNOSIS — I4891 Unspecified atrial fibrillation: Secondary | ICD-10-CM | POA: Diagnosis present

## 2023-02-21 HISTORY — PX: CARDIOVERSION: SHX1299

## 2023-02-21 SURGERY — CARDIOVERSION
Anesthesia: General

## 2023-02-21 MED ORDER — SODIUM CHLORIDE 0.9 % IV SOLN: INTRAVENOUS | Status: DC

## 2023-02-21 MED ORDER — LIDOCAINE 2% (20 MG/ML) 5 ML SYRINGE
INTRAMUSCULAR | Status: DC | PRN
  Administered 2023-02-21: 60 mg via INTRAVENOUS

## 2023-02-21 MED ORDER — PROPOFOL 10 MG/ML IV BOLUS
INTRAVENOUS | Status: DC | PRN
  Administered 2023-02-21: 60 mg via INTRAVENOUS

## 2023-02-21 NOTE — Anesthesia Postprocedure Evaluation (Signed)
Anesthesia Post Note  Patient: Anthony Carpenter  Procedure(s) Performed: CARDIOVERSION     Patient location during evaluation: PACU Anesthesia Type: General Level of consciousness: awake and alert Pain management: pain level controlled Vital Signs Assessment: post-procedure vital signs reviewed and stable Respiratory status: spontaneous breathing, nonlabored ventilation, respiratory function stable and patient connected to nasal cannula oxygen Cardiovascular status: blood pressure returned to baseline and stable Postop Assessment: no apparent nausea or vomiting Anesthetic complications: no  No notable events documented.  Last Vitals:  Vitals:   02/21/23 1312 02/21/23 1317  BP: 107/73 110/65  Pulse: (!) 57 (!) 55  Resp: 19 16  Temp:    SpO2: 91% 99%    Last Pain:  Vitals:   02/21/23 1317  TempSrc:   PainSc: 0-No pain                 Belenda Cruise P Onica Davidovich

## 2023-02-21 NOTE — Transfer of Care (Signed)
Immediate Anesthesia Transfer of Care Note  Patient: Anthony Carpenter  Procedure(s) Performed: CARDIOVERSION  Patient Location: Endoscopy Unit  Anesthesia Type:General  Level of Consciousness: drowsy  Airway & Oxygen Therapy: Patient Spontanous Breathing  Post-op Assessment: Report given to RN and Post -op Vital signs reviewed and stable  Post vital signs: Reviewed and stable  Last Vitals:  Vitals Value Taken Time  BP 148/74 02/21/23 1256  Temp    Pulse 66 02/21/23 1256  Resp 15 02/21/23 1256  SpO2 100 02/21/23 1256    Last Pain:  Vitals:   02/21/23 1220  TempSrc: Temporal  PainSc: 0-No pain         Complications: No notable events documented.

## 2023-02-21 NOTE — CV Procedure (Signed)
Procedure: Electrical Cardioversion Indications:  Atrial Fibrillation  Procedure Details:  Consent: Risks of procedure as well as the alternatives and risks of each were explained to the (patient/caregiver).  Consent for procedure obtained.  Time Out: Verified patient identification, verified procedure, site/side was marked, verified correct patient position, special equipment/implants available, medications/allergies/relevent history reviewed, required imaging and test results available. PERFORMED.  Patient placed on cardiac monitor, pulse oximetry, supplemental oxygen as necessary.  Sedation given:  Propofol 60mg ; lidocaine 60mg   Pacer pads placed anterior and posterior chest.  Cardioverted 3 time(s).  Cardioversion with synchronized biphasic 150J, 200J x2 shock.  Evaluation: Findings: Post procedure EKG shows: Atrial Fibrillation Complications: None Patient did tolerate procedure well.  Time Spent Directly with the Patient:  54minutes   Freada Bergeron 02/21/2023, 12:55 PM

## 2023-02-21 NOTE — Anesthesia Preprocedure Evaluation (Signed)
Anesthesia Evaluation  Patient identified by MRN, date of birth, ID band Patient awake    Reviewed: Allergy & Precautions, NPO status , Patient's Chart, lab work & pertinent test results  Airway Mallampati: II  TM Distance: >3 FB Neck ROM: Full    Dental no notable dental hx.    Pulmonary neg pulmonary ROS   Pulmonary exam normal        Cardiovascular hypertension, + CAD, + Past MI, + Cardiac Stents and + DOE  + dysrhythmias Atrial Fibrillation  Rhythm:Irregular Rate:Normal     Neuro/Psych negative neurological ROS  negative psych ROS   GI/Hepatic negative GI ROS, Neg liver ROS,,,  Endo/Other  negative endocrine ROS    Renal/GU negative Renal ROS  negative genitourinary   Musculoskeletal negative musculoskeletal ROS (+)    Abdominal Normal abdominal exam  (+)   Peds  Hematology negative hematology ROS (+)   Anesthesia Other Findings   Reproductive/Obstetrics                             Anesthesia Physical Anesthesia Plan  ASA: 3  Anesthesia Plan: General   Post-op Pain Management:    Induction: Intravenous  PONV Risk Score and Plan: 2 and Propofol infusion and Treatment may vary due to age or medical condition  Airway Management Planned: Mask  Additional Equipment: None  Intra-op Plan:   Post-operative Plan:   Informed Consent: I have reviewed the patients History and Physical, chart, labs and discussed the procedure including the risks, benefits and alternatives for the proposed anesthesia with the patient or authorized representative who has indicated his/her understanding and acceptance.     Dental advisory given  Plan Discussed with: CRNA  Anesthesia Plan Comments:        Anesthesia Quick Evaluation

## 2023-02-21 NOTE — Interval H&P Note (Signed)
History and Physical Interval Note:  02/21/2023 12:14 PM  Anthony Carpenter  has presented today for surgery, with the diagnosis of AFIB.  The various methods of treatment have been discussed with the patient and family. After consideration of risks, benefits and other options for treatment, the patient has consented to  Procedure(s): CARDIOVERSION (N/A) as a surgical intervention.  The patient's history has been reviewed, patient examined, no change in status, stable for surgery.  I have reviewed the patient's chart and labs.  Questions were answered to the patient's satisfaction.     Freada Bergeron

## 2023-02-21 NOTE — Discharge Instructions (Signed)

## 2023-02-21 NOTE — Anesthesia Postprocedure Evaluation (Signed)
Anesthesia Post Note  Patient: Anthony Carpenter  Procedure(s) Performed: CARDIOVERSION     Patient location during evaluation: PACU Anesthesia Type: General Level of consciousness: awake and alert Pain management: pain level controlled Vital Signs Assessment: post-procedure vital signs reviewed and stable Respiratory status: spontaneous breathing, nonlabored ventilation, respiratory function stable and patient connected to nasal cannula oxygen Cardiovascular status: blood pressure returned to baseline and stable Postop Assessment: no apparent nausea or vomiting Anesthetic complications: no  No notable events documented.  Last Vitals:  Vitals:   02/21/23 1312 02/21/23 1317  BP: 107/73 110/65  Pulse: (!) 57 (!) 55  Resp: 19 16  Temp:    SpO2: 91% 99%    Last Pain:  Vitals:   02/21/23 1317  TempSrc:   PainSc: 0-No pain                 Belenda Cruise P Paschal Blanton

## 2023-02-21 NOTE — Anesthesia Procedure Notes (Signed)
Procedure Name: MAC Date/Time: 02/21/2023 12:49 PM  Performed by: Everlean Cherry A, CRNAPre-anesthesia Checklist: Patient identified, Suction available, Patient being monitored and Emergency Drugs available Patient Re-evaluated:Patient Re-evaluated prior to induction Oxygen Delivery Method: Ambu bag Preoxygenation: Pre-oxygenation with 100% oxygen Induction Type: IV induction Ventilation: Mask ventilation without difficulty Placement Confirmation: positive ETCO2 Dental Injury: Teeth and Oropharynx as per pre-operative assessment

## 2023-02-23 ENCOUNTER — Encounter (HOSPITAL_COMMUNITY): Payer: Self-pay | Admitting: Cardiology

## 2023-03-01 ENCOUNTER — Other Ambulatory Visit: Payer: Self-pay | Admitting: Cardiovascular Disease

## 2023-03-06 ENCOUNTER — Other Ambulatory Visit (HOSPITAL_BASED_OUTPATIENT_CLINIC_OR_DEPARTMENT_OTHER): Payer: Self-pay | Admitting: Family Medicine

## 2023-03-06 DIAGNOSIS — J189 Pneumonia, unspecified organism: Secondary | ICD-10-CM

## 2023-03-07 ENCOUNTER — Telehealth: Payer: Self-pay | Admitting: Cardiovascular Disease

## 2023-03-07 MED ORDER — APIXABAN 5 MG PO TABS: 5.0000 mg | ORAL_TABLET | Freq: Two times a day (BID) | ORAL | 0 refills | Status: DC

## 2023-03-07 NOTE — Telephone Encounter (Signed)
Pt c/o medication issue:  1. Name of Medication:  apixaban (ELIQUIS) 5 MG TABS tablet  2. How are you currently taking this medication (dosage and times per day)?   3. Are you having a reaction (difficulty breathing--STAT)?   4. What is your medication issue?   Patient would like to know why he is still taking Eliquis.

## 2023-03-07 NOTE — Telephone Encounter (Signed)
Pt still had post cardioversion Afib when discharged. I advised him that he is staying on the Eliquis... he says it has been costly for him so I will leave samples up at the front for him and the number for pt assistance and reach out to [facility] for any help we can give to him.   He will keep his 05/21/2023 appt with Dr Elease Hashimoto.

## 2023-03-08 NOTE — Telephone Encounter (Signed)
Patient is returning call. Transferred to Patricia, LPN. 

## 2023-03-08 NOTE — Telephone Encounter (Signed)
**Note De-Identified Anthony Carpenter Obfuscation** No answer so I left a VM asking him to call Larita Fife at Dr Harvie Bridge office at [facility] at [phone] if he needs any help applying for Eliquis assistance through [facility] or if he has any other questions or concerns about his Eliquis cost.

## 2023-03-08 NOTE — Telephone Encounter (Signed)
**Note De-Identified Joselynn Amoroso Obfuscation** The pt states that he and his wife earn more than the limit ($16,109 for a family of two) to be eligible for Eliquis assistance through [facility].  He states that his pharmacy advised him that his cost for a 30 day supply of Eliquis is $150.  He states that he can afford this amount for his Eliquis as he feels that the high cost may be due to a unmet deductible and that it will go down to normal cost once he meets it.  He is aware to call us back if he gets to a point where he cannot afford his Eliquis. He verbalized understanding and thanked me for calling him to discuss.

## 2023-03-14 ENCOUNTER — Ambulatory Visit (HOSPITAL_BASED_OUTPATIENT_CLINIC_OR_DEPARTMENT_OTHER)
Admission: RE | Admit: 2023-03-14 | Discharge: 2023-03-14 | Disposition: A | Discharge status: Home / Self Care | Payer: Medicare Other | Source: Ambulatory Visit | Attending: Family Medicine | Admitting: Family Medicine

## 2023-03-14 DIAGNOSIS — J189 Pneumonia, unspecified organism: Secondary | ICD-10-CM | POA: Diagnosis not present

## 2023-03-21 ENCOUNTER — Telehealth: Payer: Self-pay | Admitting: Cardiovascular Disease

## 2023-03-21 NOTE — Telephone Encounter (Signed)
Pt states he noticed bilateral legs "from knee down" are swollen as of yesterday. Denies any recent injuries or falls. Still able to get socks and shoes on, verified that toes look same size as usual. States yesterday he was very busy mowing and weed eating all day, noticed the swelling that's mainly in calf area, after he came inside and sat down. Says last night his legs were 'weeping" clear fluid in a few places, still there as of this morning, but definitely much less. Does attest that swelling seemed to have receded slightly overnight. Condones worse SOB over the last few months, states Albuterol inhaler we prescribed him in march didn't seem to help very much. Does have runny nose and seasonal allergies. He denies any bleeding (mentioned in the original call notes by operator)-specifically denied any new excessive bruising, bleeding gums, pink-tinged urine, or changes in stool color. Echo done 09/20/22 (6 months ago) showed normal EF and only mild MR and AS. Labs done 02/10/23 also confirmed no abnormal kidney functioning (all aspects WNL). Educated patient on use of compression stockings which he doesn't currently use or own, and advised him to keep his legs elevated as much as possible today. Will route to MD for review and any other thoughts/recommendations.

## 2023-03-21 NOTE — Telephone Encounter (Signed)
Pt c/o medication issue:  1. Name of Medication:   apixaban (ELIQUIS) 5 MG TABS tablet    2. How are you currently taking this medication (dosage and times per day)? Take 1 tablet (5 mg total) by mouth 2 (two) times daily.   3. Are you having a reaction (difficulty breathing--STAT)? No  4. What is your medication issue?  Pt states that medication is causing his legs to swell, weep and have blood spots. Pt says that his PCP advised him to call office. Pt would like a callback regarding this matter as soon as possible. Please advise

## 2023-03-22 NOTE — Telephone Encounter (Signed)
Attempted phone call to pt.  OK per Epic to leave voicemail on home phone.  Pt advised per Dr Elease Hashimoto elevate feet and legs above the heart and try wearing the compression hose.  If swelling continues after trying these recommendations can consider additional testing.  May call [phone] for further questions or concerns.

## 2023-03-30 ENCOUNTER — Telehealth: Payer: Self-pay | Admitting: Cardiovascular Disease

## 2023-03-30 DIAGNOSIS — M7989 Other specified soft tissue disorders: Secondary | ICD-10-CM

## 2023-03-30 NOTE — Telephone Encounter (Signed)
Patient is calling back for update. Please advise  

## 2023-03-30 NOTE — Telephone Encounter (Signed)
Pt calling for update  Pt states his legs are still weeping, he is unsure what to do for the weekend

## 2023-03-30 NOTE — Telephone Encounter (Signed)
Pt c/o medication issue:  1. Name of Medication:   apixaban (ELIQUIS) 5 MG TABS tablet    2. How are you currently taking this medication (dosage and times per day)? As written  3. Are you having a reaction (difficulty breathing--STAT)? no  4. What is your medication issue? Pt called in stating he is still having weeping in his legs since taking this med. He states he is not having any other symptoms. Please advise.

## 2023-04-02 NOTE — Telephone Encounter (Signed)
Carpenter, Anthony Ping, MD  to Me  Alois Cliche, RN  03/22/23  3:52 PM  Agree with note from Eugene Gavia, RN. His LV function is normal Has mild AS which should not be contributing to this He should elevated his legs  ( higher than his heart), wear compression hose. If his leg swelling does not improve, I would be ok with getting lower extremity venous duplex scan to look for venous obstruction PN  Returned call to patient who states that the weeping has gotten better since last call. He did get compression socks and has been wearing them daily. Sees improvement upon waking, but builds throughout the day-even with the compression socks. Denies one leg is worse than the other, no localized warmth or redness. Denies sensitivity or tenderness. Can ambulate on them without pain. Advised him to watch his salt intake, continue current medications, and he will be called to do ultrasound. Order placed at this time for LE venous US.

## 2023-04-05 NOTE — Telephone Encounter (Signed)
Returned call to patient who states that he has one single spot on one of his lower legs that's weeping. He says this is better than normal for him. He did have a nose bleed episode yesterday, says it didn't last long and has not had a recurrence since. Advised of avoiding heavy blows, use nasal saline, and monitor for signs of bleeding. He understands to call us back if nosebleeds become an issue or he has an episode that doesn't seem to stop.   Per Dr Elease Hashimoto, place referral to [facility] for lower extremity edema and weeping. Pt aware referral has been placed and he'll be called to schedule.

## 2023-04-05 NOTE — Addendum Note (Signed)
Addended by: Lars Mage on: 04/05/2023 05:36 PM   Modules accepted: Orders

## 2023-04-05 NOTE — Telephone Encounter (Signed)
Patient calling back to request the call back go to: [phone]

## 2023-04-05 NOTE — Telephone Encounter (Signed)
Pt c/o medication issue:  1. Name of Medication:   apixaban (ELIQUIS) 5 MG TABS tablet   2. How are you currently taking this medication (dosage and times per day)? As prescribed for about a month or more  3. Are you having a reaction (difficulty breathing--STAT)?   4. What is your medication issue?    Patient stated yesterday he had nose bleeds but today he has weeping in both legs since he started on this medication.

## 2023-04-06 ENCOUNTER — Other Ambulatory Visit: Payer: Self-pay

## 2023-04-06 DIAGNOSIS — I4891 Unspecified atrial fibrillation: Secondary | ICD-10-CM

## 2023-04-06 MED ORDER — APIXABAN 5 MG PO TABS
5.0000 mg | ORAL_TABLET | Freq: Two times a day (BID) | ORAL | 1 refills | Status: DC
Start: 2023-04-06 — End: 2024-02-18

## 2023-04-06 NOTE — Telephone Encounter (Signed)
Prescription refill request for Eliquis received. Indication: Afib  Last office visit: 02/05/23 ([facility])  Scr: 1.03 (02/10/23)  Age: 82 Weight: 80.7kg Appropriate dose. Refill sent.

## 2023-04-09 ENCOUNTER — Ambulatory Visit (HOSPITAL_COMMUNITY)
Admission: RE | Admit: 2023-04-09 | Discharge: 2023-04-09 | Disposition: A | Discharge status: Home / Self Care | Payer: Medicare Other | Source: Ambulatory Visit | Attending: Cardiovascular Disease | Admitting: Cardiovascular Disease

## 2023-04-09 DIAGNOSIS — M7989 Other specified soft tissue disorders: Secondary | ICD-10-CM

## 2023-04-10 ENCOUNTER — Encounter: Payer: Self-pay | Admitting: Pulmonary Disease

## 2023-04-10 ENCOUNTER — Ambulatory Visit (INDEPENDENT_AMBULATORY_CARE_PROVIDER_SITE_OTHER): Payer: Medicare Other

## 2023-04-10 ENCOUNTER — Ambulatory Visit: Payer: Medicare Other | Admitting: Pulmonary Disease

## 2023-04-10 VITALS — BP 122/68 | HR 64 | Temp 97.7°F | Ht 70.0 in | Wt 173.8 lb

## 2023-04-10 DIAGNOSIS — R0609 Other forms of dyspnea: Secondary | ICD-10-CM

## 2023-04-10 DIAGNOSIS — J189 Pneumonia, unspecified organism: Secondary | ICD-10-CM

## 2023-04-10 MED ORDER — STIOLTO RESPIMAT 2.5-2.5 MCG/ACT IN AERS: 2.0000 | INHALATION_SPRAY | Freq: Every day | RESPIRATORY_TRACT | 0 refills | Status: DC

## 2023-04-10 NOTE — Patient Instructions (Signed)
Nice to meet you  For shortness of breath try Stiolto 2 puffs once a day, if this helps I can prescribe it if not it is okay we can just stop it  Will get a chest x-ray today  Will get pulmonary function test for further evaluation  I am glad the pneumonia is better, I am somewhat suspicious for a little bit of fluid backup from the heart given the lab results in the hospital and the appearance of the chest x-ray.  If the inhaler does not help and the pulmonary function tests are reassuring and normal, I think we need to consider further cardiac workup.  Return to clinic in 2 months with pulmonary function test same day, follow-up Dr. Judeth Horn

## 2023-04-10 NOTE — Progress Notes (Signed)
@Patient  ID: Anthony Carpenter, male    DOB: 1941/05/22, 82 y.o.   MRN: [id number]  Chief Complaint  Patient presents with   Consult    Pt states about 1 month ago he had problems with congestion in his chest. States he will cough up occasional yellow phlegm. Denies any complaints of wheezing. Does have occasional SOB with exertion.    Referring provider: Joycelyn Rua, MD  HPI:   82 y.o. man whom we are seeing for evaluation of dyspnea on exertion as well as hospitalization 01/2023 for acute hypoxemic respiratory failure due to pneumonia with concern for cardiogenic volume overload on exam.  Most recent cardiology note reviewed.  Most recent PCP note reviewed.  Discharge summary 01/2023 reviewed.  Admitted to the hospital 01/2019 for pneumonia.  Had been feeling bad, cough shortness of breath.  Initially treated with oral antibiotics.  Chest x-ray obtained by PCP with concern for pneumonia on my read interpretation reveals bilateral fluffy alveolar filling lower lobe predominant infiltrates with small bilateral effusions concerning for volume overload.  Instructed to go to the hospital after results.  Hypoxemic.  Started on antibiotics. Got dose of IV Lasix.  The day after that he was discharged on room air.  His procalcitonin was elevated over 2.  He had dyspnea on exertion for at least 6 months.  Prior to hospitalization.  Worse on inclines and stairs.  No time of day when things are better or worse.  No position to make things better or worse.  No seasonal or environmental factors he can identify what makes it better or worse.  Never diagnosed with asthma.  Never smoker.  No breathing issues prior to this.  Has used albuterol as needed, has helped a little bit.  Subsequent chest x-ray 02/2023 shows resolution of bilateral lower lobe predominant infiltrates, did have small right-sided effusion versus volume loss on my review and interpretation.    Questionaires / Pulmonary Flowsheets:   ACT:       No data to display          MMRC:     No data to display          Epworth:      No data to display          Tests:   FENO:  No results found for: "NITRICOXIDE"  PFT:     No data to display          WALK:      No data to display          Imaging: Personally reviewed and as per EMR and discussion in this note VAS [place] LOWER EXTREMITY VENOUS (DVT)  Result Date: 04/09/2023  Lower Venous DVT Study Patient Name:  Anthony Carpenter  Date of Exam:   04/09/2023 Medical Rec #: [id number]      Accession #:    [id number] Date of Birth: 07-Mar-1941       Patient Gender: M Patient Age:   68 years Exam Location:  [facility] Procedure:      VAS [place] LOWER EXTREMITY VENOUS (DVT) Referring Phys: Anthony Carpenter --------------------------------------------------------------------------------  Other Indications: Patient had been having some weeping and swelling of the                    lower extremities since starting on Eliquis. Has seen some                    improvement with compression stockings,  walking and elevating                    legs. Anticoagulation: Eliquis. Performing Technologist: Anthony Carpenter RVT  Examination Guidelines: A complete evaluation includes B-mode imaging, spectral Doppler, color Doppler, and power Doppler as needed of all accessible portions of each vessel. Bilateral testing is considered an integral part of a complete examination. Limited examinations for reoccurring indications may be performed as noted. The reflux portion of the exam is performed with the patient in reverse Trendelenburg.  +---------+---------------+---------+-----------+----------+--------------+ RIGHT    CompressibilityPhasicitySpontaneityPropertiesThrombus Aging +---------+---------------+---------+-----------+----------+--------------+ CFV      Full           Yes      Yes                                  +---------+---------------+---------+-----------+----------+--------------+ SFJ      Full           Yes      Yes                                 +---------+---------------+---------+-----------+----------+--------------+ FV Prox  Full           Yes      Yes                                 +---------+---------------+---------+-----------+----------+--------------+ FV Mid   Full           Yes      Yes                                 +---------+---------------+---------+-----------+----------+--------------+ FV DistalFull           Yes      Yes                                 +---------+---------------+---------+-----------+----------+--------------+ PFV      Full                                                        +---------+---------------+---------+-----------+----------+--------------+ POP      Full           Yes      Yes                                 +---------+---------------+---------+-----------+----------+--------------+ PTV      Full                                                        +---------+---------------+---------+-----------+----------+--------------+ PERO     Full                                                        +---------+---------------+---------+-----------+----------+--------------+  Gastroc  Full                                                        +---------+---------------+---------+-----------+----------+--------------+ GSV      Full           Yes      Yes                                 +---------+---------------+---------+-----------+----------+--------------+ Difficult visualization of the calf veins due to edema.  +---------+---------------+---------+-----------+----------+--------------+ LEFT     CompressibilityPhasicitySpontaneityPropertiesThrombus Aging +---------+---------------+---------+-----------+----------+--------------+ CFV      Full           Yes      Yes                                  +---------+---------------+---------+-----------+----------+--------------+ SFJ      Full           Yes      Yes                                 +---------+---------------+---------+-----------+----------+--------------+ FV Prox  Full           Yes      Yes                                 +---------+---------------+---------+-----------+----------+--------------+ FV Mid   Full           Yes      Yes                                 +---------+---------------+---------+-----------+----------+--------------+ FV DistalFull           Yes      Yes                                 +---------+---------------+---------+-----------+----------+--------------+ PFV      Full                                                        +---------+---------------+---------+-----------+----------+--------------+ POP      Full           Yes      Yes                                 +---------+---------------+---------+-----------+----------+--------------+ PTV      Full           Yes      Yes                                 +---------+---------------+---------+-----------+----------+--------------+ PERO     Full  Yes      Yes                                 +---------+---------------+---------+-----------+----------+--------------+ Gastroc  Full                                                        +---------+---------------+---------+-----------+----------+--------------+ GSV      Full           Yes      Yes                                 +---------+---------------+---------+-----------+----------+--------------+     Summary: RIGHT: - No evidence of deep vein thrombosis in the lower extremity. No indirect evidence of obstruction proximal to the inguinal ligament. - No cystic structure found in the popliteal fossa.  LEFT: - No evidence of deep vein thrombosis in the lower extremity. No indirect evidence of obstruction proximal to the inguinal ligament. -  No cystic structure found in the popliteal fossa.  *See table(s) above for measurements and observations. Electronically signed by Coral Else MD on 04/09/2023 at 3:37:14 PM.    Final    DG Chest 2 View  Result Date: 03/17/2023 CLINICAL DATA:  Pneumonia EXAM: CHEST - 2 VIEW COMPARISON:  02/07/2023 FINDINGS: Unremarkable cardiac silhouette. Small right-sided pleural effusion. No pneumothorax. Normal pulmonary vasculature. Left lung clear. Minimal right base consolidation or volume loss. IMPRESSION: Right base volume loss or consolidation and small effusion. Electronically Signed   By: Layla Maw M.D.   On: 03/17/2023 23:01    Lab Results: Personally reviewed CBC    Component Value Date/Time   WBC 10.2 02/10/2023 0448   RBC 4.72 02/10/2023 0448   HGB 13.5 02/10/2023 0448   HGB 14.1 09/04/2022 0831   HCT 41.7 02/10/2023 0448   HCT 42.7 09/04/2022 0831   PLT 185 02/10/2023 0448   PLT 154 09/04/2022 0831   MCV 88.3 02/10/2023 0448   MCV 88 09/04/2022 0831   MCH 28.6 02/10/2023 0448   MCHC 32.4 02/10/2023 0448   RDW 14.6 02/10/2023 0448   RDW 13.5 09/04/2022 0831   LYMPHSABS 1.0 02/08/2023 0007   MONOABS 0.8 02/08/2023 0007   EOSABS 0.0 02/08/2023 0007   BASOSABS 0.0 02/08/2023 0007    BMET    Component Value Date/Time   NA 136 02/10/2023 0448   NA 143 09/04/2022 0831   K 3.5 02/10/2023 0448   CL 101 02/10/2023 0448   CO2 26 02/10/2023 0448   GLUCOSE 88 02/10/2023 0448   BUN 20 02/10/2023 0448   BUN 16 09/04/2022 0831   CREATININE 1.03 02/10/2023 0448   CREATININE 0.95 09/11/2016 0904   CALCIUM 8.3 (L) 02/10/2023 0448   GFRNONAA >60 02/10/2023 0448   GFRAA 81 09/03/2020 1004    BNP    Component Value Date/Time   BNP 474.8 (H) 02/08/2023 0007    ProBNP    Component Value Date/Time   PROBNP 241.6 (H) 10/01/2014 0548    Specialty Problems       Pulmonary Problems   Pulmonary nodule seen on imaging study    a. seen on CT during 09/2014 admission, may  require follow up imaging  DOE (dyspnea on exertion)   Acute respiratory failure with hypoxia (HCC)    No Known Allergies  Immunization History  Administered Date(s) Administered   Influenza-Unspecified 09/28/2014    Past Medical History:  Diagnosis Date   Aortic stenosis, mild    Bicuspid aortic valve    Bradycardia    Carotid bruit present    a. Right side   Coronary artery disease    a. CAD s/p PTCA/Taxus stent to mid-prox LAD 2006  b.  cath 10/02/14 with 60% focal instent restenosis in LAD prox to the Diag takeoff, 30% mid AV groove left circ stenosis and luminal irregularities in a large dominant RCA. FFR of LAD was 0.85 and not felt to be HD significant to warrant PCI.    Glaucoma    Hyperlipidemia    Hypertension    Myocardial infarction [facility]) 2006   Pulmonary nodule seen on imaging study    a. seen on CT during 09/2014 admission, may require follow up imaging     Tobacco History: Social History   Tobacco Use  Smoking Status Never  Smokeless Tobacco Never   Counseling given: Not Answered   Continue to not smoke  Outpatient Encounter Medications as of 04/10/2023  Medication Sig   albuterol (VENTOLIN HFA) 108 (90 Base) MCG/ACT inhaler Inhale 2 puffs into the lungs every 6 (six) hours as needed for wheezing or shortness of breath.   apixaban (ELIQUIS) 5 MG TABS tablet Take 1 tablet (5 mg total) by mouth 2 (two) times daily.   atorvastatin (LIPITOR) 80 MG tablet TAKE 1 TABLET BY MOUTH DAILY AT  6 PM   dorzolamide-timolol (COSOPT) 22.3-6.8 MG/ML ophthalmic solution Place 1 drop into both eyes 2 (two) times daily.    latanoprost (XALATAN) 0.005 % ophthalmic solution Place 1 drop into both eyes at bedtime.   lisinopril (ZESTRIL) 5 MG tablet Take 1 tablet (5 mg total) by mouth daily.   Multiple Vitamins-Minerals (ICAPS AREDS FORMULA PO) Take 1 tablet by mouth 2 (two) times daily.    nitroGLYCERIN (NITROSTAT) 0.4 MG SL tablet Place 1 tablet (0.4 mg total) under  the tongue every 5 (five) minutes as needed. For chest pain.   Tiotropium Bromide-Olodaterol (STIOLTO RESPIMAT) 2.5-2.5 MCG/ACT AERS Inhale 2 puffs into the lungs daily.   No facility-administered encounter medications on file as of 04/10/2023.     Review of Systems  Review of Systems  No chest pain with exertion.  No orthopnea or PND.  Comprehensive review of systems otherwise negative. Physical Exam  BP 122/68 (BP Location: Left Arm, Patient Position: Sitting, Cuff Size: Normal)   Pulse 64   Temp 97.7 F (36.5 C) (Oral)   Ht 5\' 10"  (1.778 m)   Wt 173 lb 12.8 oz (78.8 kg)   SpO2 99% Comment: RA  BMI 24.94 kg/m   Wt Readings from Last 5 Encounters:  04/10/23 173 lb 12.8 oz (78.8 kg)  02/08/23 177 lb 14.4 oz (80.7 kg)  02/05/23 181 lb 12.8 oz (82.5 kg)  01/08/23 182 lb 3.2 oz (82.6 kg)  09/04/22 183 lb 3.2 oz (83.1 kg)    BMI Readings from Last 5 Encounters:  04/10/23 24.94 kg/m  02/08/23 25.53 kg/m  02/05/23 26.09 kg/m  01/08/23 26.14 kg/m  09/04/22 26.29 kg/m     Physical Exam General: Sitting in chair, no acute distress Eyes: EOMI, no icterus Neck: Supple, no JVP Pulmonary: Clear, normal work of breathing, equal and symmetric air entry bilaterally Cardiovascular: Trace edema to midshin, warm Abdomen:  Nondistended, bowel sounds present MSK: No synovitis, no joint effusion Neuro: Normal gait, no weakness Psych: Normal mood, full affect  Assessment & Plan:   Community-acquired pneumonia: Hospitalized 01/2023 with bilateral infiltrates, frankly imaging characteristics and bilateral effusions most concerning for pulmonary edema.  Quick improvement in symptoms after Lasix.  However procalcitonin was elevated.  Reasonable to treat for pneumonia.  Subsequent chest x-ray 02/2023 with improvement.  Small right-sided pleural effusion versus volume loss.  Repeat chest x-ray today to ensure not worsening.  Exam is reassuring.  Dyspnea on exertion: High suspicion for  cardiac etiology given concern for pulmonary edema with hospitalization 01/2023, BNP was elevated higher than the level previously measured.  TTE 09/2022 with mitral regurgitation and aortic stenosis.  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  Borderline LVEDP 14 12/2021.  Hypoxemia improved with Lasix while in the hospital.   Never smoker.  Denies history of asthma.  Trial dual bronchodilators with Stiolto.  PFTs for further evaluation.   Return in about 2 months (around 06/10/2023) for f/u Dr. Judeth Horn, after PFT.   Karren Burly, MD 04/10/2023   This appointment required 60 minutes of patient care (this includes precharting, chart review, review of results, face-to-face care, etc.).

## 2023-04-24 ENCOUNTER — Ambulatory Visit: Payer: Medicare Other | Attending: Cardiovascular Disease | Admitting: Cardiovascular Disease

## 2023-04-24 ENCOUNTER — Encounter: Payer: Self-pay | Admitting: Cardiovascular Disease

## 2023-04-24 VITALS — BP 120/70 | HR 50 | Ht 70.0 in | Wt 170.8 lb

## 2023-04-24 DIAGNOSIS — I872 Venous insufficiency (chronic) (peripheral): Secondary | ICD-10-CM

## 2023-04-24 DIAGNOSIS — I4819 Other persistent atrial fibrillation: Secondary | ICD-10-CM | POA: Diagnosis not present

## 2023-04-24 DIAGNOSIS — E785 Hyperlipidemia, unspecified: Secondary | ICD-10-CM

## 2023-04-24 DIAGNOSIS — I2510 Atherosclerotic heart disease of native coronary artery without angina pectoris: Secondary | ICD-10-CM

## 2023-04-24 NOTE — Addendum Note (Signed)
Addended by: Lars Mage on: 04/24/2023 03:33 PM   Modules accepted: Orders

## 2023-04-24 NOTE — Progress Notes (Signed)
Cardiology Office Note   Date:  04/24/2023   ID:  Carpenter Anthony, DOB 02-18-1941, MRN [id number]  PCP:  Joycelyn Rua, MD  Cardiologist:  Dr. Elease Hashimoto  No chief complaint on file.     History of Present Illness: Anthony Carpenter is a 82 y.o. male who was referred by Dr. Elease Hashimoto for evaluation of lower extremity edema. He has known history of coronary artery disease status post stent placement to the LAD, hyperlipidemia, essential hypertension, aortic valve stenosis and pulmonary nodule.  He is referred for lower extremity edema that gets worse at the end of the day.  He denies lower extremity claudication.  He had significant swelling in his legs recently with weeping of fluid.  However, he reports improvement over the last 2 weeks.  He had an echocardiogram done in November 2023 which showed normal LV systolic function, indeterminate diastolic function and indeterminate pulmonary pressure.  Aortic stenosis was mild.  Lower extremity venous Doppler in May showed no evidence of DVT. He had cardioversion done in April but does not remember that.  He denies chest pain or shortness of breath.  Past Medical History:  Diagnosis Date   Aortic stenosis, mild    Bicuspid aortic valve    Bradycardia    Carotid bruit present    a. Right side   Coronary artery disease    a. CAD s/p PTCA/Taxus stent to mid-prox LAD 2006  b.  cath 10/02/14 with 60% focal instent restenosis in LAD prox to the Diag takeoff, 30% mid AV groove left circ stenosis and luminal irregularities in a large dominant RCA. FFR of LAD was 0.85 and not felt to be HD significant to warrant PCI.    Glaucoma    Hyperlipidemia    Hypertension    Myocardial infarction [facility]) 2006   Pulmonary nodule seen on imaging study    a. seen on CT during 09/2014 admission, may require follow up imaging     Past Surgical History:  Procedure Laterality Date   CARDIAC CATHETERIZATION  07/28/2005   Est EF of 55% -- Single-vessel obstructive  disease in the proximal/mid left anterior descending -- Preserved left ventricular systolic function with good anterior wall motion   CARDIOVERSION N/A 02/21/2023   Procedure: CARDIOVERSION;  Surgeon: Meriam Sprague, MD;  Location: [facility] ENDOSCOPY;  Service: Cardiovascular;  Laterality: N/A;   cataracts  5-6 years ago   COLONOSCOPY  ? 2009   CORONARY ANGIOPLASTY WITH STENT PLACEMENT  07/28/2005   Successful PTCA and stenting of the mid and proximal left anterior descending artery.  The patient is stable leaving the catheterization laboratory -- Vesta Mixer, M.D.     CORONARY STENT PLACEMENT  02/11/2015   3.0 x 16 mm Promus Premier DES to the mLAD for ISR   LEFT HEART CATH AND CORONARY ANGIOGRAPHY N/A 01/10/2022   Procedure: LEFT HEART CATH AND CORONARY ANGIOGRAPHY;  Surgeon: Marykay Lex, MD;  Location: [facility] INVASIVE CV LAB;  Service: Cardiovascular;  Laterality: N/A;   LEFT HEART CATHETERIZATION WITH CORONARY ANGIOGRAM N/A 10/02/2014   Procedure: LEFT HEART CATHETERIZATION WITH CORONARY ANGIOGRAM;  Surgeon: Lennette Bihari, MD;  Location: [facility] CATH LAB;  Service: Cardiovascular;  Laterality: N/A;   LEFT HEART CATHETERIZATION WITH CORONARY ANGIOGRAM N/A 02/11/2015   Procedure: LEFT HEART CATHETERIZATION WITH CORONARY ANGIOGRAM;  Surgeon: Kathleene Hazel, MD; LAD 99% ISR, CFX okay, OM1 50%, RCA 30%, D1 30% (jailed by the LAD stent), EF 45-50 %   THULIUM LASER  TURP (TRANSURETHRAL RESECTION OF PROSTATE) N/A 08/06/2018   Procedure: THULIUM LASER TURP (TRANSURETHRAL RESECTION OF PROSTATE);  Surgeon: Jerilee Field, MD;  Location: [facility] ORS;  Service: Urology;  Laterality: N/A;     Current Outpatient Medications  Medication Sig Dispense Refill   albuterol (VENTOLIN HFA) 108 (90 Base) MCG/ACT inhaler Inhale 2 puffs into the lungs every 6 (six) hours as needed for wheezing or shortness of breath. 8 g 2   apixaban (ELIQUIS) 5 MG TABS tablet Take 1 tablet (5 mg total) by mouth 2 (two) times  daily. 180 tablet 1   atorvastatin (LIPITOR) 80 MG tablet TAKE 1 TABLET BY MOUTH DAILY AT  6 PM 90 tablet 3   dorzolamide-timolol (COSOPT) 22.3-6.8 MG/ML ophthalmic solution Place 1 drop into both eyes 2 (two) times daily.      latanoprost (XALATAN) 0.005 % ophthalmic solution Place 1 drop into both eyes at bedtime.     lisinopril (ZESTRIL) 5 MG tablet Take 1 tablet (5 mg total) by mouth daily. 90 tablet 3   Multiple Vitamins-Minerals (ICAPS AREDS FORMULA PO) Take 1 tablet by mouth 2 (two) times daily.      nitroGLYCERIN (NITROSTAT) 0.4 MG SL tablet Place 1 tablet (0.4 mg total) under the tongue every 5 (five) minutes as needed. For chest pain. 25 tablet 12   Tiotropium Bromide-Olodaterol (STIOLTO RESPIMAT) 2.5-2.5 MCG/ACT AERS Inhale 2 puffs into the lungs daily. 4 g 0   No current facility-administered medications for this visit.    Allergies:   Patient has no known allergies.    Social History:  The patient  reports that he has never smoked. He has never used smokeless tobacco. He reports that he does not drink alcohol and does not use drugs.   Family History:  The patient's family history includes Cancer in an other family member; Lung cancer in his father.    ROS:  Please see the history of present illness.   Otherwise, review of systems are positive for none.   All other systems are reviewed and negative.    PHYSICAL EXAM: VS:  BP 120/70 (BP Location: Left Arm, Patient Position: Sitting, Cuff Size: Normal)   Pulse (!) 50   Ht 5\' 10"  (1.778 m)   Wt 170 lb 12.8 oz (77.5 kg)   SpO2 98%   BMI 24.51 kg/m  , BMI Body mass index is 24.51 kg/m. GEN: Well nourished, well developed, in no acute distress  HEENT: normal  Neck: no JVD, carotid bruits, or masses Cardiac: RRR; no rubs, or gallops, mild bilateral leg edema .  2 out of 6 systolic murmur in the aortic area. Respiratory:  clear to auscultation bilaterally, normal work of breathing GI: soft, nontender, nondistended, + BS MS:  no deformity or atrophy  Skin: warm and dry, no rash Neuro:  Strength and sensation are intact Psych: euthymic mood, full affect Distal pedal pulses are palpable but diminished.  EKG:  EKG is not ordered today.    Recent Labs: 01/08/2023: TSH 1.450 02/08/2023: B Natriuretic Peptide 474.8 02/09/2023: ALT 25 02/10/2023: BUN 20; Creatinine, Ser 1.03; Hemoglobin 13.5; Platelets 185; Potassium 3.5; Sodium 136    Lipid Panel    Component Value Date/Time   CHOL 106 09/04/2022 0831   TRIG 71 09/04/2022 0831   HDL 38 (L) 09/04/2022 0831   CHOLHDL 2.8 09/04/2022 0831   CHOLHDL 2.8 09/11/2016 0904   VLDL 14 09/11/2016 0904   LDLCALC 53 09/04/2022 0831      Wt Readings from Last  3 Encounters:  04/24/23 170 lb 12.8 oz (77.5 kg)  04/10/23 173 lb 12.8 oz (78.8 kg)  02/08/23 177 lb 14.4 oz (80.7 kg)            No data to display            ASSESSMENT AND PLAN:  1.  Chronic venous insufficiency: His lower extremity edema is likely due to chronic venous insufficiency with no clear evidence of congestive heart failure to contribute to this.  He reports improvement in this over the last 2 weeks.  No evidence of DVT on recent venous Doppler.  I discussed with him the importance of leg elevation during the day.  I instructed him to start wearing knee-high support stockings during the day as well.  He has those at home but has not been using them.  2.  Atrial fibrillation: Status post unsuccessful cardioversion.  Ventricular rate is controlled.  Continue long-term anticoagulation.  3.  Coronary artery disease involving native coronary arteries without angina: Continue medical therapy.  4.  Hyperlipidemia: On high-dose atorvastatin 80 mg daily.    Disposition:   FU with me as needed  Signed,  Lorine Bears, MD  04/24/2023 10:36 AM    [facility]

## 2023-04-24 NOTE — Patient Instructions (Signed)
Medication Instructions:  No changes *If you need a refill on your cardiac medications before your next appointment, please call your pharmacy*   Lab Work: None ordered If you have labs (blood work) drawn today and your tests are completely normal, you will receive your results only by: MyChart Message (if you have MyChart) OR A paper copy in the mail If you have any lab test that is abnormal or we need to change your treatment, we will call you to review the results.   Testing/Procedures: None ordered   Follow-Up: At [facility], you and your health needs are our priority.  As part of our continuing mission to provide you with exceptional heart care, we have created designated Provider Care Teams.  These Care Teams include your primary Cardiologist (physician) and Advanced Practice Providers (APPs -  Physician Assistants and Nurse Practitioners) who all work together to provide you with the care you need, when you need it.  We recommend signing up for the patient portal called "MyChart".  Sign up information is provided on this After Visit Summary.  MyChart is used to connect with patients for Virtual Visits (Telemedicine).  Patients are able to view lab/test results, encounter notes, upcoming appointments, etc.  Non-urgent messages can be sent to your provider as well.   To learn more about what you can do with MyChart, go to [email or web address].    Your next appointment:   As needed with Dr. Kirke Corin  Other Instructions Dr. Kirke Corin recommends KNEE HIGH COMPRESSION STOCKINGS. Wear them during the day and take them off at night            .

## 2023-05-20 ENCOUNTER — Encounter: Payer: Self-pay | Admitting: Cardiovascular Disease

## 2023-05-20 NOTE — Progress Notes (Unsigned)
Cardiology Office Note   Date:  05/21/2023   ID:  Anthony Carpenter, DOB [birth year], MRN [id number]  PCP:  Joycelyn Rua, MD  Cardiologist:   Kristeen Miss, MD   Chief Complaint  Patient presents with   Leg Swelling   Atrial Fibrillation   Coronary Artery Disease         1. Coronary artery disease-status post PTCA and stenting of his mid LAD.  ( February 11, 2015)    We placed a 2.75 x 20 mm Taxus stent. It was post dilated using a 3.0 Quantum Maverick, 07/28/05  2. Hyperlipidemia-    3. Possible bicuspid aortic valve   Anthony Carpenter  is doing well.  No chest pain or dyspnea.  He plans on putting in a garden this summer .  He's building his house this year.  He was admitted to the hospital with some chest pain/abdominal pain. A Myoview study did not reveal any ischemia.  He has felt better.  No further episodes of chest pain.  He forgot to take his meds this am.  Feb. 26, 2014: Anthony Carpenter  is doing well.  He has not had any angina.  He remains active - still building houses.   July 14, 2013:  Anthony Carpenter is doing  Well.  No angina.    July 14, 2014:  Anthony Carpenter is doing well.  Still working   No CP, no dyspnea.    Dec. 11, 2015;  Anthony Carpenter was in the hospital for CP recently.  He was having vague chest discomfort.   Cath showed a 60 % instent restenosis.   Pains have resolved.    He is back doing all of his normal activities.  BP has been lower     February 05, 2015:  Anthony Carpenter is a 82 y.o. male who presents for some CP and DOE for the past month. Occurs when he is working outside.   Mowing, speading fertilizer.   Will last as long as he's working . Eases up if he stops.   March 17, 2015:  Anthony Carpenter is a 82 y.o. male who presents for follow up of an episode of unstable angina.  I saw him in March for symptoms worisome of UAP.  CAth showed.   Left main: No obstructive disease.    Left Anterior Descending Artery: Large caliber vessel that courses to the apex. The mid segment is stented. The  old stent has 99% restenosis. The distal vessel is free of obstructive disease. The Diagonal branch is moderate in caliber with ostial 30% stenosis where the vessel is jailed by the LAD stent.   Circumflex Artery: Large caliber vessel with moderate caliber obtuse marginal branch. The obtuse marginal branch has proximal 50% stenosis.   Right Coronary Artery: Large dominant vessel with 30% mid stenosis.  Left Ventricular Angiogram: LVEF=45-50% with hypokinesis of the anterior wall and apex.  He had 3.0 x 16 mm Promus Premier DES in the mid LAD. The stent was post-dilated with a 3.25 x 12 mm [place] balloon x 1. The stenosis was taken from 99% down to 0%.   He is feeling much better.   Not shortness of breath   Participating in cardiac rehab. Bruising from the plavix.   But no real PC   Oct. 24, 2016:  Anthony Carpenter is doing well. Walking regularly  Has several projects doing on. Developing some land  Has some bruising and occasional nose bleeds.   March 13, 2016:  Staying active.  Occasional nose bleeds   Oct. 23, 2017:  Doing well .  No CP or dyspnea. Cooked 80 gallons of [place] stew this weekend  Has occasional nose bleed   Mar 28, 2017:  Doing well.   No angina Working on his housing development   October 04, 2017:  Doing well .   Has lots of bruising but not so much nosebleeding. We have stopped the ASA and continued the plavix.    May 20, 2018: Anthony Carpenter is seen today for follow-up of his coronary artery disease and hyperlipidemia No CP ,  No dyspnea.   Still bruising.   Is still on Plavix,   Is off ASA  No recent nose bleeds  Jan. 6, 2020 Has mild DOE if he exrcises vigorously No return of the angina he had last year  June 02, 2019 :  Has CAD.   Was having angina,  Started Imdur.  Pain significantly improved. Not able to do as much as he thinks he would do .  Walks 1/2 mile without any problems.  Can climb 2 flights of stairs.   Has not needed NTG recently   September 04, 2019:  Anthony Carpenter is seen today for a follow-up visit.  He has a history of coronary artery disease.  When I last saw him in July he was having some unusual types of chest pains.  He was able to exercise without too much difficulty.  He did not want have a heart catheterization but wanted to be reevaluated in several months and returns today for further evaluation.  Still has some CP if he "rushes" too much   Has some angina - not as bad as his previous CP.  Imdur seems to help   March 02, 2020: Anthony Carpenter is seen today for follow-up of his coronary artery disease.  He has had some unusual type of chest pains in the past but he did not want to have a heart catheterization. Blood pressure and heart rate remain well controlled. Can walk for abut a mile ,   Gets out of breath if he rushes too much .  Has not had to take a NTG .   Oct. 14, 2021: Anthony Carpenter is seen today for follow up of his CAD, hyperlipidemia No CP. 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 Wants to try Sildenafil  Will DC his Imdur   Feb. 17, 2023 Anthony Carpenter is seen today for follow up of his CAD He has been having more angina like chest pain recently  Added Imdur - which has helped    Oct. 16,,2023  Anthony Carpenter is seen for follow up of his CAD Was having symptoms of UAP when I saw him Repeat cath feb. 2023 showed widely patent stents. No CP, does have some DOE   Will check labs - CBC, BMP, lipids, LFTs Will get an echo - his last echo from 2018 showed mild AS ( mean AV gradient of 9 mmHg)    Feb. 19, 2024 Anthony Carpenter is seen today for follow up of his CAD , mild AS  Has chronic shortness of breath with exercise Walks without any difficulty  He is in atrial fib  today . Cannot tell that his HR is irreg.  This Afib may explain his dyspnea    February 05, 2023 Anthony Carpenter is seen for follow up visit for his CAD, mild AS Newly diagnosed Afib  Had cardioversion on February 21 2023 but it was unsuccessful.   He is seen for follow up of his atrial fib and to schedule cardioversion if he remains in atrial fib  May 21, 2023  Seen with his wife .  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  He remains in atrial fibrillation but states that he is doing great.     Past Medical History:  Diagnosis Date   Aortic stenosis, mild    Bicuspid aortic valve    Bradycardia    Carotid bruit present    a. Right side   Coronary artery disease    a. CAD s/p PTCA/Taxus stent to mid-prox LAD 2006  b.  cath 10/02/14 with 60% focal instent restenosis in LAD prox to the Diag takeoff, 30% mid AV groove left circ stenosis and luminal irregularities in a large dominant RCA. FFR of LAD was 0.85 and not felt to be HD significant to warrant PCI.    Glaucoma    Hyperlipidemia    Hypertension    Myocardial infarction [facility]) 2006   Pulmonary nodule seen on imaging study    a. seen on CT during 09/2014 admission, may require follow up imaging     Past Surgical History:  Procedure Laterality Date   CARDIAC CATHETERIZATION  07/28/2005   Est EF of 55% -- Single-vessel obstructive disease in the proximal/mid left anterior descending -- Preserved left ventricular systolic function with good anterior wall motion   CARDIOVERSION N/A 02/21/2023   Procedure: CARDIOVERSION;  Surgeon: Meriam Sprague, MD;  Location: [facility] ENDOSCOPY;  Service: Cardiovascular;  Laterality: N/A;   cataracts  5-6  years ago   COLONOSCOPY  ? 2009   CORONARY ANGIOPLASTY WITH STENT PLACEMENT  07/28/2005   Successful PTCA and stenting of the mid and proximal left anterior descending artery.  The patient is stable leaving the catheterization laboratory -- Vesta Mixer, M.D.     CORONARY STENT PLACEMENT  02/11/2015   3.0 x 16 mm Promus Premier DES to the mLAD for ISR   LEFT HEART CATH AND CORONARY ANGIOGRAPHY N/A 01/10/2022   Procedure: LEFT HEART CATH AND CORONARY ANGIOGRAPHY;  Surgeon: Marykay Lex, MD;  Location: [facility] INVASIVE CV LAB;  Service: Cardiovascular;  Laterality: N/A;   LEFT HEART CATHETERIZATION WITH CORONARY ANGIOGRAM N/A 10/02/2014   Procedure: LEFT HEART CATHETERIZATION WITH CORONARY ANGIOGRAM;  Surgeon: Lennette Bihari, MD;  Location: [facility] CATH LAB;  Service: Cardiovascular;  Laterality: N/A;   LEFT HEART CATHETERIZATION WITH CORONARY ANGIOGRAM N/A 02/11/2015   Procedure: LEFT HEART CATHETERIZATION WITH CORONARY ANGIOGRAM;  Surgeon: Kathleene Hazel, MD; LAD 99% ISR, CFX okay, OM1 50%, RCA 30%, D1 30% (jailed by the LAD stent), EF 45-50 %   THULIUM LASER TURP (TRANSURETHRAL RESECTION OF PROSTATE) N/A 08/06/2018  Procedure: THULIUM LASER TURP (TRANSURETHRAL RESECTION OF PROSTATE);  Surgeon: Jerilee Field, MD;  Location: [facility];  Service: Urology;  Laterality: N/A;     Current Outpatient Medications  Medication Sig Dispense Refill   albuterol (VENTOLIN HFA) 108 (90 Base) MCG/ACT inhaler Inhale 2 puffs into the lungs every 6 (six) hours as needed for wheezing or shortness of breath. 8 g 2   apixaban (ELIQUIS) 5 MG TABS tablet Take 1 tablet (5 mg total) by mouth 2 (two) times daily. 180 tablet 1   atorvastatin (LIPITOR) 80 MG tablet TAKE 1 TABLET BY MOUTH DAILY AT  6 PM 90 tablet 3   dorzolamide-timolol (COSOPT) 22.3-6.8 MG/ML ophthalmic solution Place 1 drop into both eyes 2 (two) times daily.      latanoprost (XALATAN) 0.005 % ophthalmic solution Place 1 drop into both eyes at  bedtime.     Multiple Vitamins-Minerals (ICAPS AREDS FORMULA PO) Take 1 tablet by mouth 2 (two) times daily.      nitroGLYCERIN (NITROSTAT) 0.4 MG SL tablet Place 1 tablet (0.4 mg total) under the tongue every 5 (five) minutes as needed. For chest pain. 25 tablet 12   Tiotropium Bromide-Olodaterol (STIOLTO RESPIMAT) 2.5-2.5 MCG/ACT AERS Inhale 2 puffs into the lungs daily. 4 g 0   No current facility-administered medications for this visit.    Allergies:   Patient has no known allergies.    Social History:  The patient  reports that he has never smoked. He has never used smokeless tobacco. He reports that he does not drink alcohol and does not use drugs.   Family History:  The patient's family history includes Cancer in an other family member; Lung cancer in his father.    ROS:   Noted in current history, otherwise review of systems is negative.   Physical Exam: Blood pressure 100/60, pulse (!) 55, height 5\' 10"  (1.778 m), weight 163 lb 3.2 oz (74 kg), SpO2 100 %.       GEN:  Well nourished, well developed in no acute distress HEENT: Normal NECK: No JVD; No carotid bruits LYMPHATICS: No lymphadenopathy CARDIAC: irreg. Irreg.  soft systolic murmur  RESPIRATORY:  Clear to auscultation without rales, wheezing or rhonchi  ABDOMEN: Soft, non-tender, non-distended MUSCULOSKELETAL:  No edema; No deformity  SKIN: Warm and dry NEUROLOGIC:  Alert and oriented x 3     EKG:       Recent Labs: 01/08/2023: TSH 1.450 02/08/2023: B Natriuretic Peptide 474.8 02/09/2023: ALT 25 02/10/2023: BUN 20; Creatinine, Ser 1.03; Hemoglobin 13.5; Platelets 185; Potassium 3.5; Sodium 136    Lipid Panel    Component Value Date/Time   CHOL 106 09/04/2022 0831   TRIG 71 09/04/2022 0831   HDL 38 (L) 09/04/2022 0831   CHOLHDL 2.8 09/04/2022 0831   CHOLHDL 2.8 09/11/2016 0904   VLDL 14 09/11/2016 0904   LDLCALC 53 09/04/2022 0831      Wt Readings from Last 3 Encounters:  05/21/23 163 lb 3.2 oz  (74 kg)  04/24/23 170 lb 12.8 oz (77.5 kg)  04/10/23 173 lb 12.8 oz (78.8 kg)      Other studies Reviewed: Additional studies/ records that were reviewed today include: . Review of the above records demonstrates:    ASSESSMENT AND PLAN:   1. Coronary artery disease-    no angina. .  2. Atrial fib:   We tried a cardioversion in April, 2024 but it was unsuccessful.  Overall he seems to be tolerating the A-fib quite well.  Continue  Eliquis.    3.  Generalized fatigue:    He is lost about 30 pounds since last year.  He informs me that he eats pretty well.  Will discontinue his lisinopril since his blood pressure is borderline low.  4.  Upper respiratory tori tract infection:     5.  HTN:  BP is borderline low Will DC Lisinopril     Current medicines are reviewed at length with the patient today.  The patient does not have concerns regarding medicines.  The following changes have been made:  no change  Labs/ tests ordered today include:   No orders of the defined types were placed in this encounter.     Disposition:       Signed, Kristeen Miss, MD  05/21/2023 11:41 AM    [facility] [address] [place], [place], [place]  [address] Phone: [phone]; Fax: [phone]

## 2023-05-21 ENCOUNTER — Encounter: Payer: Self-pay | Admitting: Cardiovascular Disease

## 2023-05-21 ENCOUNTER — Ambulatory Visit: Payer: Medicare Other | Attending: Cardiovascular Disease | Admitting: Cardiovascular Disease

## 2023-05-21 VITALS — BP 100/60 | HR 55 | Ht 70.0 in | Wt 163.2 lb

## 2023-05-21 DIAGNOSIS — I4811 Longstanding persistent atrial fibrillation: Secondary | ICD-10-CM | POA: Diagnosis not present

## 2023-05-21 DIAGNOSIS — I25119 Atherosclerotic heart disease of native coronary artery with unspecified angina pectoris: Secondary | ICD-10-CM

## 2023-05-21 DIAGNOSIS — I2510 Atherosclerotic heart disease of native coronary artery without angina pectoris: Secondary | ICD-10-CM

## 2023-05-21 DIAGNOSIS — I350 Nonrheumatic aortic (valve) stenosis: Secondary | ICD-10-CM

## 2023-05-21 NOTE — Patient Instructions (Addendum)
Medication Instructions:  Your physician has recommended you make the following change in your medication:  STOP Lisinopril.  *If you need a refill on your cardiac medications before your next appointment, please call your pharmacy*   Lab Work: None  If you have labs (blood work) drawn today and your tests are completely normal, you will receive your results only by: MyChart Message (if you have MyChart) OR A paper copy in the mail If you have any lab test that is abnormal or we need to change your treatment, we will call you to review the results.   Testing/Procedures: None   Follow-Up: At [facility], you and your health needs are our priority.  As part of our continuing mission to provide you with exceptional heart care, we have created designated Provider Care Teams.  These Care Teams include your primary Cardiologist (physician) and Advanced Practice Providers (APPs -  Physician Assistants and Nurse Practitioners) who all work together to provide you with the care you need, when you need it.  We recommend signing up for the patient portal called "MyChart".  Sign up information is provided on this After Visit Summary.  MyChart is used to connect with patients for Virtual Visits (Telemedicine).  Patients are able to view lab/test results, encounter notes, upcoming appointments, etc.  Non-urgent messages can be sent to your provider as well.   To learn more about what you can do with MyChart, go to [email or web address].    Your next appointment:   6 month(s)  Provider:   Eligha Bridegroom, NP

## 2023-05-22 ENCOUNTER — Other Ambulatory Visit: Payer: Self-pay | Admitting: *Deleted

## 2023-05-22 DIAGNOSIS — M7989 Other specified soft tissue disorders: Secondary | ICD-10-CM

## 2023-06-14 NOTE — Progress Notes (Deleted)
[facility]           [place]  History and Physical   Anthony Carpenter is a 82 y.o. male who presents with leg swelling.  He was referred from Dr. Elease Hashimoto.   He had DVT study in May 2024 that was negative for DVT.  Pt has hx of AF on eliquis (hx cardioversion 02/2023 but unsuccessful), HTN, CAD s/p PTCA and stenting of mid LAD 2016, HLD, mild AS  The pt does *** have hx of previous venous procedures. The patient has *** history of DVT. Pt does *** history of varicose vein.   Pt does *** history of skin changes in lower legs.   There is *** family history of venous disorders.   The patient has *** used compression stockings in the past.    The pt is on a statin for cholesterol management.  The pt is not on a daily aspirin.   Other AC:  Eliquis The pt is not on medication for hypertension.   The pt is not on medication for diabetes.   Tobacco hx:  never  Pt does *** have family hx of AAA.  Past Medical History:  Diagnosis Date   Aortic stenosis, mild    Bicuspid aortic valve    Bradycardia    Carotid bruit present    a. Right side   Coronary artery disease    a. CAD s/p PTCA/Taxus stent to mid-prox LAD 2006  b.  cath 10/02/14 with 60% focal instent restenosis in LAD prox to the Diag takeoff, 30% mid AV groove left circ stenosis and luminal irregularities in a large dominant RCA. FFR of LAD was 0.85 and not felt to be HD significant to warrant PCI.    Glaucoma    Hyperlipidemia    Hypertension    Myocardial infarction [facility]) 2006   Pulmonary nodule seen on imaging study    a. seen on CT during 09/2014 admission, may require follow up imaging     Past Surgical History:  Procedure Laterality Date   CARDIAC CATHETERIZATION  07/28/2005   Est EF of 55% -- Single-vessel obstructive disease in the proximal/mid left anterior descending -- Preserved left ventricular systolic function with good anterior wall motion   CARDIOVERSION N/A 02/21/2023    Procedure: CARDIOVERSION;  Surgeon: Meriam Sprague, MD;  Location: [facility] ENDOSCOPY;  Service: Cardiovascular;  Laterality: N/A;   cataracts  5-6 years ago   COLONOSCOPY  ? 2009   CORONARY ANGIOPLASTY WITH STENT PLACEMENT  07/28/2005   Successful PTCA and stenting of the mid and proximal left anterior descending artery.  The patient is stable leaving the catheterization laboratory -- Vesta Mixer, M.D.     CORONARY STENT PLACEMENT  02/11/2015   3.0 x 16 mm Promus Premier DES to the mLAD for ISR   LEFT HEART CATH AND CORONARY ANGIOGRAPHY N/A 01/10/2022   Procedure: LEFT HEART CATH AND CORONARY ANGIOGRAPHY;  Surgeon: Marykay Lex, MD;  Location: [facility] INVASIVE CV LAB;  Service: Cardiovascular;  Laterality: N/A;   LEFT HEART CATHETERIZATION WITH CORONARY ANGIOGRAM N/A 10/02/2014   Procedure: LEFT HEART CATHETERIZATION WITH CORONARY ANGIOGRAM;  Surgeon: Lennette Bihari, MD;  Location: [facility] CATH LAB;  Service: Cardiovascular;  Laterality: N/A;   LEFT HEART CATHETERIZATION WITH CORONARY ANGIOGRAM N/A 02/11/2015   Procedure: LEFT HEART CATHETERIZATION WITH CORONARY ANGIOGRAM;  Surgeon: Kathleene Hazel, MD; LAD 99% ISR, CFX okay, OM1 50%, RCA 30%, D1  30% (jailed by the LAD stent), EF 45-50 %   THULIUM LASER TURP (TRANSURETHRAL RESECTION OF PROSTATE) N/A 08/06/2018   Procedure: THULIUM LASER TURP (TRANSURETHRAL RESECTION OF PROSTATE);  Surgeon: Jerilee Field, MD;  Location: [facility] ORS;  Service: Urology;  Laterality: N/A;    Social History   Socioeconomic History   Marital status: Married    Spouse name: Not on file   Number of children: Not on file   Years of education: Not on file   Highest education level: Not on file  Occupational History   Not on file  Tobacco Use   Smoking status: Never   Smokeless tobacco: Never  Vaping Use   Vaping status: Never Used  Substance and Sexual Activity   Alcohol use: No   Drug use: No   Sexual activity: Not Currently  Other Topics Concern   Not  on file  Social History Narrative   Not on file   Social Determinants of Health   Financial Resource Strain: Not on file  Food Insecurity: No Food Insecurity (02/08/2023)   Hunger Vital Sign    Worried About Running Out of Food in the Last Year: Never true    Ran Out of Food in the Last Year: Never true  Transportation Needs: No Transportation Needs (02/08/2023)   PRAPARE - Administrator, Civil Service (Medical): No    Lack of Transportation (Non-Medical): No  Physical Activity: Not on file  Stress: Not on file  Social Connections: Not on file  Intimate Partner Violence: Not At Risk (02/08/2023)   Humiliation, Afraid, Rape, and Kick questionnaire    Fear of Current or Ex-Partner: No    Emotionally Abused: No    Physically Abused: No    Sexually Abused: No    *** Family History  Problem Relation Age of Onset   Lung cancer Father    Cancer Other    Heart disease Neg Hx     Current Outpatient Medications  Medication Sig Dispense Refill   albuterol (VENTOLIN HFA) 108 (90 Base) MCG/ACT inhaler Inhale 2 puffs into the lungs every 6 (six) hours as needed for wheezing or shortness of breath. 8 g 2   apixaban (ELIQUIS) 5 MG TABS tablet Take 1 tablet (5 mg total) by mouth 2 (two) times daily. 180 tablet 1   atorvastatin (LIPITOR) 80 MG tablet TAKE 1 TABLET BY MOUTH DAILY AT  6 PM 90 tablet 3   dorzolamide-timolol (COSOPT) 22.3-6.8 MG/ML ophthalmic solution Place 1 drop into both eyes 2 (two) times daily.      latanoprost (XALATAN) 0.005 % ophthalmic solution Place 1 drop into both eyes at bedtime.     Multiple Vitamins-Minerals (ICAPS AREDS FORMULA PO) Take 1 tablet by mouth 2 (two) times daily.      nitroGLYCERIN (NITROSTAT) 0.4 MG SL tablet Place 1 tablet (0.4 mg total) under the tongue every 5 (five) minutes as needed. For chest pain. 25 tablet 12   Tiotropium Bromide-Olodaterol (STIOLTO RESPIMAT) 2.5-2.5 MCG/ACT AERS Inhale 2 puffs into the lungs daily. 4 g 0   No  current facility-administered medications for this visit.    No Known Allergies  REVIEW OF SYSTEMS:  *** [X]  denotes positive finding, [ ]  denotes negative finding Cardiac  Comments:  Chest pain or chest pressure:    Shortness of breath upon exertion:    Short of breath when lying flat:    Irregular heart rhythm:        Vascular    Pain  in calf, thigh, or hip brought on by ambulation:    Pain in feet at night that wakes you up from your sleep:     Blood clot in your veins:    Leg swelling:  x       Pulmonary    Oxygen at home:    Productive cough:     Wheezing:         Neurologic    Sudden weakness in arms or legs:     Sudden numbness in arms or legs:     Sudden onset of difficulty speaking or slurred speech:    Temporary loss of vision in one eye:     Problems with dizziness:         Gastrointestinal    Blood in stool:     Vomited blood:         Genitourinary    Burning when urinating:     Blood in urine:        Psychiatric    Major depression:         Hematologic    Bleeding problems:    Problems with blood clotting too easily:        Skin    Rashes or ulcers:        Constitutional    Fever or chills:      PHYSICAL EXAMINATION:  ***  General:  WDWN in NAD; vital signs documented above Gait: Not observed HENT: WNL, normocephalic Pulmonary: normal non-labored breathing without wheezing Cardiac: {Desc; regular/irreg:14544} HR; {With/Without:20273} carotid bruit*** Abdomen: soft, NT, aortic pulse is *** palpable Skin: {With/Without:20273} rashes Vascular Exam/Pulses:  Right Left  Radial {Exam; arterial pulse strength 0-4:30167} {Exam; arterial pulse strength 0-4:30167}  DP {Exam; arterial pulse strength 0-4:30167} {Exam; arterial pulse strength 0-4:30167}  PT {Exam; arterial pulse strength 0-4:30167} {Exam; arterial pulse strength 0-4:30167}   Extremities: ***  Neurologic: A&O X 3;  moving all extremities equally Psychiatric:  The pt has {Desc;  normal/abnormal:11317::"Normal"} affect.   Non-Invasive Vascular Imaging:   Venous duplex on 06/15/2023: ***    Anthony Carpenter is a 82 y.o. male who presents with: ***    -pt has *** pedal pulses -pt does *** have evidence of DVT.  Pt does ***have venous reflux *** -discussed with pt about wearing *** high *** mmHg compression stockings and pt was measured for these today.   *** -discussed the importance of leg elevation and how to elevate properly - pt is advised to elevate their legs and a diagram is given to them to demonstrate for pt to lay flat on their back with knees elevated and slightly bent with their feet higher than their knees, which puts their feet higher than their heart for 15 minutes per day.  If pt cannot lay flat, advised to lay as flat as possible.  -pt is advised to continue as much walking as possible and avoid sitting or standing for long periods of time.  -discussed importance of weight loss and exercise and that water aerobics would also be beneficial.  -handout with recommendations given -pt will f/u ***   Doreatha Massed, [facility] [phone]  Clinic MD:  Karin Lieu

## 2023-06-15 ENCOUNTER — Ambulatory Visit (HOSPITAL_COMMUNITY): Payer: Medicare Other

## 2023-07-12 ENCOUNTER — Other Ambulatory Visit: Payer: Self-pay | Admitting: Gastroenterology

## 2023-07-12 DIAGNOSIS — R198 Other specified symptoms and signs involving the digestive system and abdomen: Secondary | ICD-10-CM

## 2023-07-12 DIAGNOSIS — R634 Abnormal weight loss: Secondary | ICD-10-CM

## 2023-08-08 ENCOUNTER — Ambulatory Visit
Admission: RE | Admit: 2023-08-08 | Discharge: 2023-08-08 | Disposition: A | Discharge status: Home / Self Care | Payer: Medicare Other | Source: Ambulatory Visit | Attending: Gastroenterology | Admitting: Gastroenterology

## 2023-08-08 DIAGNOSIS — R634 Abnormal weight loss: Secondary | ICD-10-CM

## 2023-08-08 DIAGNOSIS — R198 Other specified symptoms and signs involving the digestive system and abdomen: Secondary | ICD-10-CM

## 2023-08-08 MED ORDER — IOPAMIDOL (ISOVUE-370) INJECTION 76%
75.0000 mL | Freq: Once | INTRAVENOUS | Status: AC | PRN
  Administered 2023-08-08: 75 mL via INTRAVENOUS

## 2023-08-17 ENCOUNTER — Ambulatory Visit (HOSPITAL_COMMUNITY): Payer: Medicare Other

## 2023-09-24 ENCOUNTER — Other Ambulatory Visit: Payer: Self-pay | Admitting: *Deleted

## 2023-09-24 DIAGNOSIS — M7989 Other specified soft tissue disorders: Secondary | ICD-10-CM

## 2023-09-28 ENCOUNTER — Ambulatory Visit: Payer: Medicare Other | Admitting: Physician Assistant

## 2023-09-28 ENCOUNTER — Ambulatory Visit (HOSPITAL_COMMUNITY)
Admission: RE | Admit: 2023-09-28 | Discharge: 2023-09-28 | Disposition: A | Discharge status: Home / Self Care | Payer: Medicare Other | Source: Ambulatory Visit | Attending: Physician Assistant | Admitting: Physician Assistant

## 2023-09-28 VITALS — BP 129/74 | HR 60 | Temp 97.3°F | Ht 70.0 in | Wt 170.6 lb

## 2023-09-28 DIAGNOSIS — M7989 Other specified soft tissue disorders: Secondary | ICD-10-CM

## 2023-09-28 DIAGNOSIS — I872 Venous insufficiency (chronic) (peripheral): Secondary | ICD-10-CM

## 2023-09-28 NOTE — Progress Notes (Signed)
Office Note     CC:  follow up Requesting Provider:  Nahser, Deloris Ping, MD  HPI: Anthony Carpenter is a 82 y.o. (1940/12/14) male who presents for evaluation of right lower extremity edema.  The patient denies any history of DVT, venous ulcerations, trauma, or prior vascular interventions to the leg.  The patient believes she has had edema of the right leg more so than the left for several years.  He however has no pain or discomfort associated with the swelling.  He does not wear compression and has no interest in wearing compression.  He does not elevate his legs during the day.  He denies tobacco use.  He is accompanied today by his wife.   Past Medical History:  Diagnosis Date   Aortic stenosis, mild    Bicuspid aortic valve    Bradycardia    Carotid bruit present    a. Right side   Coronary artery disease    a. CAD s/p PTCA/Taxus stent to mid-prox LAD 2006  b.  cath 10/02/14 with 60% focal instent restenosis in LAD prox to the Diag takeoff, 30% mid AV groove left circ stenosis and luminal irregularities in a large dominant RCA. FFR of LAD was 0.85 and not felt to be HD significant to warrant PCI.    Glaucoma    Hyperlipidemia    Hypertension    Myocardial infarction [facility]) 2006   Pulmonary nodule seen on imaging study    a. seen on CT during 09/2014 admission, may require follow up imaging     Past Surgical History:  Procedure Laterality Date   CARDIAC CATHETERIZATION  07/28/2005   Est EF of 55% -- Single-vessel obstructive disease in the proximal/mid left anterior descending -- Preserved left ventricular systolic function with good anterior wall motion   CARDIOVERSION N/A 02/21/2023   Procedure: CARDIOVERSION;  Surgeon: Meriam Sprague, MD;  Location: [facility] ENDOSCOPY;  Service: Cardiovascular;  Laterality: N/A;   cataracts  5-6 years ago   COLONOSCOPY  ? 2009   CORONARY ANGIOPLASTY WITH STENT PLACEMENT  07/28/2005   Successful PTCA and stenting of the mid and proximal left  anterior descending artery.  The patient is stable leaving the catheterization laboratory -- Vesta Mixer, M.D.     CORONARY STENT PLACEMENT  02/11/2015   3.0 x 16 mm Promus Premier DES to the mLAD for ISR   LEFT HEART CATH AND CORONARY ANGIOGRAPHY N/A 01/10/2022   Procedure: LEFT HEART CATH AND CORONARY ANGIOGRAPHY;  Surgeon: Marykay Lex, MD;  Location: [facility] INVASIVE CV LAB;  Service: Cardiovascular;  Laterality: N/A;   LEFT HEART CATHETERIZATION WITH CORONARY ANGIOGRAM N/A 10/02/2014   Procedure: LEFT HEART CATHETERIZATION WITH CORONARY ANGIOGRAM;  Surgeon: Lennette Bihari, MD;  Location: [facility] CATH LAB;  Service: Cardiovascular;  Laterality: N/A;   LEFT HEART CATHETERIZATION WITH CORONARY ANGIOGRAM N/A 02/11/2015   Procedure: LEFT HEART CATHETERIZATION WITH CORONARY ANGIOGRAM;  Surgeon: Kathleene Hazel, MD; LAD 99% ISR, CFX okay, OM1 50%, RCA 30%, D1 30% (jailed by the LAD stent), EF 45-50 %   THULIUM LASER TURP (TRANSURETHRAL RESECTION OF PROSTATE) N/A 08/06/2018   Procedure: THULIUM LASER TURP (TRANSURETHRAL RESECTION OF PROSTATE);  Surgeon: Jerilee Field, MD;  Location: [facility] ORS;  Service: Urology;  Laterality: N/A;    Social History   Socioeconomic History   Marital status: Married    Spouse name: Not on file   Number of children: Not on file   Years of education: Not on file  Highest education level: Not on file  Occupational History   Not on file  Tobacco Use   Smoking status: Never   Smokeless tobacco: Never  Vaping Use   Vaping status: Never Used  Substance and Sexual Activity   Alcohol use: No   Drug use: No   Sexual activity: Not Currently  Other Topics Concern   Not on file  Social History Narrative   Not on file   Social Determinants of Health   Financial Resource Strain: Not on file  Food Insecurity: No Food Insecurity (02/08/2023)   Hunger Vital Sign    Worried About Running Out of Food in the Last Year: Never true    Ran Out of Food in the Last  Year: Never true  Transportation Needs: No Transportation Needs (02/08/2023)   PRAPARE - Administrator, Civil Service (Medical): No    Lack of Transportation (Non-Medical): No  Physical Activity: Not on file  Stress: Not on file  Social Connections: Not on file  Intimate Partner Violence: Not At Risk (02/08/2023)   Humiliation, Afraid, Rape, and Kick questionnaire    Fear of Current or Ex-Partner: No    Emotionally Abused: No    Physically Abused: No    Sexually Abused: No    Family History  Problem Relation Age of Onset   Lung cancer Father    Cancer Other    Heart disease Neg Hx     Current Outpatient Medications  Medication Sig Dispense Refill   albuterol (VENTOLIN HFA) 108 (90 Base) MCG/ACT inhaler Inhale 2 puffs into the lungs every 6 (six) hours as needed for wheezing or shortness of breath. 8 g 2   apixaban (ELIQUIS) 5 MG TABS tablet Take 1 tablet (5 mg total) by mouth 2 (two) times daily. 180 tablet 1   atorvastatin (LIPITOR) 80 MG tablet TAKE 1 TABLET BY MOUTH DAILY AT  6 PM 90 tablet 3   dorzolamide-timolol (COSOPT) 22.3-6.8 MG/ML ophthalmic solution Place 1 drop into both eyes 2 (two) times daily.      latanoprost (XALATAN) 0.005 % ophthalmic solution Place 1 drop into both eyes at bedtime.     Multiple Vitamins-Minerals (ICAPS AREDS FORMULA PO) Take 1 tablet by mouth 2 (two) times daily.      nitroGLYCERIN (NITROSTAT) 0.4 MG SL tablet Place 1 tablet (0.4 mg total) under the tongue every 5 (five) minutes as needed. For chest pain. 25 tablet 12   Tiotropium Bromide-Olodaterol (STIOLTO RESPIMAT) 2.5-2.5 MCG/ACT AERS Inhale 2 puffs into the lungs daily. 4 g 0   No current facility-administered medications for this visit.    No Known Allergies   REVIEW OF SYSTEMS:   [X]  denotes positive finding, [ ]  denotes negative finding Cardiac  Comments:  Chest pain or chest pressure:    Shortness of breath upon exertion:    Short of breath when lying flat:     Irregular heart rhythm:        Vascular    Pain in calf, thigh, or hip brought on by ambulation:    Pain in feet at night that wakes you up from your sleep:     Blood clot in your veins:    Leg swelling:         Pulmonary    Oxygen at home:    Productive cough:     Wheezing:         Neurologic    Sudden weakness in arms or legs:  Sudden numbness in arms or legs:     Sudden onset of difficulty speaking or slurred speech:    Temporary loss of vision in one eye:     Problems with dizziness:         Gastrointestinal    Blood in stool:     Vomited blood:         Genitourinary    Burning when urinating:     Blood in urine:        Psychiatric    Major depression:         Hematologic    Bleeding problems:    Problems with blood clotting too easily:        Skin    Rashes or ulcers:        Constitutional    Fever or chills:      PHYSICAL EXAMINATION:  Vitals:   09/28/23 1353  BP: 129/74  Pulse: 60  Temp: (!) 97.3 F (36.3 C)  SpO2: 98%  Weight: 170 lb 9.6 oz (77.4 kg)  Height: 5\' 10"  (1.778 m)    General:  WDWN in NAD; vital signs documented above Gait: Not observed HENT: WNL, normocephalic Pulmonary: normal non-labored breathing , without Rales, rhonchi,  wheezing Cardiac: regular HR Abdomen: soft, NT, no masses Skin: without rashes Vascular Exam/Pulses: 2+ DP pulses bilaterally Extremities: without ischemic changes, without Gangrene , without cellulitis; without open wounds; hyperpigmentation changes right more so than the left lower extremity; no ulcerations; right slightly more swollen than the left Musculoskeletal: no muscle wasting or atrophy  Neurologic: A&O X 3 Psychiatric:  The pt has Normal affect.   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 male here for evaluation of right lower extremity  edema  Mr. Anthony Carpenter is an 82 year old male with a symptomatic right lower extremity edema.  Right lower extremity venous reflux study was negative for DVT and deep venous reflux.  He does have an incompetent GSV throughout the thigh however given that the edema of the right leg is completely asymptomatic he would not be a candidate for laser ablation therapy.  If his swelling bothersome in the future recommendations included knee-high compression, periodic elevation of the leg above the level of the heart, and avoiding prolonged sitting and standing.  He can follow-up with [place] on an as-needed basis.   Emilie Rutter, PA-C [facility] [phone]  Clinic MD:   Hetty Blend on call

## 2023-11-23 NOTE — Progress Notes (Signed)
 Cardiology Office Note:  .   Date:  12/03/2023  ID:  Anthony Carpenter, DOB June 03, 1941, MRN [id number] PCP: Nanci Senior, MD  [facility] Providers Cardiologist:  Aleene Passe, MD    Patient Profile: .      PMH Coronary artery disease S/p MI PTCA and stenting of mid and prox LAD 07/2005 DES to mLAD for 99% in-stent restenosis, OM1 50%, RCA 30%, D1 30% (jailed by LAD stent 01/2015 LHC 01/10/2022 Patent prox to mid LAD overlapping DES Prox Cx to mid Cx lesion 50% 1st diag (jailed) 30% stenosed Normal LVEF, normal LVEDP Mild calcification of AV, no stenosis Rx therapy Hyperlipidemia Hypertension Aortic stenosis Bradycardia Chronic venous insufficiency Atrial fibrillation Unsuccessful DCCV 02/21/2023 On chronic anticoagulation  He established care with cardiology post PTCA 07/2005.  He has maintained follow-up with Dr. Passe.  Readmission December 2015 for chest pain with cath showing 60% in-stent restenosis.  He continued to have angina and had repeat cath 01/2015 and had 3.0 x 16 mm Promus Premier DES to [employer]. The stent was post-dilated with 3.25 x 12 mm [place] balloon x 1 with stenosis taken down from 99% to 0%.  He participated in cardiac rehab following that invention.  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 Repeat cardiac cath February 2023 showed widely patent stents.  He developed atrial fibrillation 01/08/2023.  He was unable to tell that heart rate was irregular but he did note worsening dyspnea.  He was started on Eliquis  5 mg twice daily for stroke prevention for CHA2DS2-VASc score of 4.  He underwent cardioversion 02/21/2023 which unfortunately was not successful.  Last cardiology clinic visit was 05/21/2023 with Dr. Passe.  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  BP running on the low side.  Having chronic leg edema and was referred to [facility].  Right lower extremity venous duplex negative for DVT and deep venous reflux. He  has not wanted to wear compression stockings.  Lisinopril  was discontinued based on 30 lb weight loss and soft BP.  He assured Dr. Passe that he was eating well.       History of Present Illness: .   Anthony Carpenter is a very pleasant 83 y.o. male who is here today for follow-up of CAD, accompanied by his wife. He is concerned about the high cost of Eliquis , which costs him $270 for 2 or 3 month supply. He expresses willingness to continue taking the medication despite the cost, but is interested in exploring more affordable options. He reports experiencing increased shortness of breath since 2023.  He denies chest pain, palpitations, orthopnea, PND, edema, presyncope or syncope. Admits to being less active recently due to the cold weather but tries to stay mobile by walking around his house and occasionally going for walks outside when the weather permits. Had a recent ultrasound on his legs, which did not reveal any issues. He denies leg pain.   Discussed the use of AI scribe software for clinical note transcription with the patient, who gave verbal consent to proceed.   ROS: See HPI       Studies Reviewed: .        Risk Assessment/Calculations:    CHA2DS2-VASc Score = 4   This indicates a 4.8% annual risk of stroke. The patient's score is based upon: CHF History: 0 HTN History: 1 Diabetes History: 0 Stroke History: 0 Vascular Disease History: 1 Age Score: 2 Gender Score: 0  Physical Exam:   VS:  BP 138/64   Pulse 68   Ht 5' 10 (1.778 m)   Wt 176 lb 6.4 oz (80 kg)   SpO2 100%   BMI 25.31 kg/m    Wt Readings from Last 3 Encounters:  12/03/23 176 lb 6.4 oz (80 kg)  09/28/23 170 lb 9.6 oz (77.4 kg)  05/21/23 163 lb 3.2 oz (74 kg)    GEN: Elderly, well developed in no acute distress NECK: No JVD; No carotid bruits CARDIAC: Irregular RR, no murmurs, rubs, gallops RESPIRATORY:  Clear to auscultation without rales, wheezing or rhonchi  ABDOMEN: Soft, non-tender,  non-distended EXTREMITIES:  No edema; No deformity     ASSESSMENT AND PLAN: .    Shortness of breath: He reports increased shortness of breath over the past several months.  Likely multifactorial in the setting of decreased physical activity and likely some deconditioning, longstanding atrial fib and aortic stenosis. He is not having dyspnea on exertion or chest pain. Mild chronic bilateral LE edema that is stable. Weight is stable. No orthopnea or PND. We are updating echocardiogram to evaluate heart and valve function.  CAD without angina: History of MI and multiple stents. Most recent cath 01/10/2022 for [place]  which revealed previously placed proximal to mid LAD stent (overlapping DES) widely patent, proximal Cx to mid Cx lesion 50% widely patent, proximal Cx to mid Cx lesion 50%, first diagonal jailed lesion 30% stenosis, mild calcification of the aortic valve, normal LV and normal LVEDP. Today, he denies chest pain, dyspnea, or other symptoms concerning for angina.  No indication for further ischemic evaluation at this time. No bleeding concerns.  He is not on aspirin  in the setting of OAC. Continue atorvastatin .   Atrial fibrillation on chronic anticoagulation: HR is well controlled. He is asymptomatic. He is not on any anti-arrhythmic or rate controlling medications.  No significant bleeding concerns.  He is concerned about the cost of Eliquis .  I will ask our pharmacy team to review insurance requirements to ensure there is nothing we can do to assist with cost. He is agreeable to continue Eliquis  even if cost remains high.  We are repeating labs to evaluate renal function today.  Continue Eliquis  5 mg twice daily which is appropriate dose for stroke prevention for CHA2DS2-VASc score of 4.  Aortic stenosis: Tricuspid aortic valve with moderate calcification, mild AI, mild AAS with mean gradient 6.0 mmHg and AVA by VTI 1.41 cm on echo 09/20/2022. He reports increased shortness of breath. No chest  pain, palpitations, presyncope or syncope.  We will get repeat echocardiogram to evaluate heart and valve function.   Hyperlipidemia LDL goal < 70: No recent lipid panel to review.  He is not aware of having lipids checked with PCP.  We will repeat lipid panel and CMET today. Continue atorvastatin .  Hypertension: BP is well controlled. No medication changes today.   Leg edema: Mild bilateral non-pitting leg edema. LE venous ultrasound completed 09/28/23 and referral to [facility], diagnosed with chronic venous insufficiency. Was encouraged to wear compression and practice leg elevation. No acute concerns today. I additionally encouraged him to use lotion for dry skin.        Disposition: 3-4 months with Dr. Alveta  Signed, Rosaline Bane, NP-C

## 2023-12-03 ENCOUNTER — Telehealth: Payer: Self-pay | Admitting: Pharmacy Technician

## 2023-12-03 ENCOUNTER — Other Ambulatory Visit (HOSPITAL_COMMUNITY): Payer: Self-pay

## 2023-12-03 ENCOUNTER — Encounter: Payer: Self-pay | Admitting: Nurse Practitioner

## 2023-12-03 ENCOUNTER — Ambulatory Visit: Payer: Medicare Other | Attending: Nurse Practitioner | Admitting: Nurse Practitioner

## 2023-12-03 VITALS — BP 138/64 | HR 68 | Ht 70.0 in | Wt 176.4 lb

## 2023-12-03 DIAGNOSIS — I350 Nonrheumatic aortic (valve) stenosis: Secondary | ICD-10-CM

## 2023-12-03 DIAGNOSIS — I4811 Longstanding persistent atrial fibrillation: Secondary | ICD-10-CM | POA: Diagnosis not present

## 2023-12-03 DIAGNOSIS — R0602 Shortness of breath: Secondary | ICD-10-CM

## 2023-12-03 DIAGNOSIS — I872 Venous insufficiency (chronic) (peripheral): Secondary | ICD-10-CM | POA: Diagnosis not present

## 2023-12-03 DIAGNOSIS — I2510 Atherosclerotic heart disease of native coronary artery without angina pectoris: Secondary | ICD-10-CM

## 2023-12-03 DIAGNOSIS — E785 Hyperlipidemia, unspecified: Secondary | ICD-10-CM

## 2023-12-03 DIAGNOSIS — I10 Essential (primary) hypertension: Secondary | ICD-10-CM

## 2023-12-03 DIAGNOSIS — M7989 Other specified soft tissue disorders: Secondary | ICD-10-CM

## 2023-12-03 DIAGNOSIS — Z7901 Long term (current) use of anticoagulants: Secondary | ICD-10-CM

## 2023-12-03 LAB — COMPREHENSIVE METABOLIC PANEL
ALT: 16 [IU]/L (ref 0–44)
AST: 22 [IU]/L (ref 0–40)
Albumin: 4 g/dL (ref 3.7–4.7)
Alkaline Phosphatase: 157 [IU]/L — ABNORMAL HIGH (ref 44–121)
BUN/Creatinine Ratio: 13 (ref 10–24)
BUN: 14 mg/dL (ref 8–27)
Bilirubin Total: 1.4 mg/dL — ABNORMAL HIGH (ref 0.0–1.2)
CO2: 22 mmol/L (ref 20–29)
Calcium: 9.1 mg/dL (ref 8.6–10.2)
Chloride: 110 mmol/L — ABNORMAL HIGH (ref 96–106)
Creatinine, Ser: 1.05 mg/dL (ref 0.76–1.27)
Globulin, Total: 2.4 g/dL (ref 1.5–4.5)
Glucose: 94 mg/dL (ref 70–99)
Potassium: 4.1 mmol/L (ref 3.5–5.2)
Sodium: 146 mmol/L — ABNORMAL HIGH (ref 134–144)
Total Protein: 6.4 g/dL (ref 6.0–8.5)
eGFR: 70 mL/min/{1.73_m2} (ref >59)

## 2023-12-03 LAB — LIPID PANEL
Chol/HDL Ratio: 2.5 {ratio} (ref 0.0–5.0)
Cholesterol, Total: 111 mg/dL (ref 100–199)
HDL: 44 mg/dL (ref >39)
LDL Chol Calc (NIH): 54 mg/dL (ref 0–99)
Triglycerides: 58 mg/dL (ref 0–149)
VLDL Cholesterol Cal: 13 mg/dL (ref 5–40)

## 2023-12-03 NOTE — Patient Instructions (Signed)
 Medication Instructions:   Your physician recommends that you continue on your current medications as directed. Please refer to the Current Medication list given to you today.   *If you need a refill on your cardiac medications before your next appointment, please call your pharmacy*   Lab Work:  TODAY!!!! LIPID/CMET  If you have labs (blood work) drawn today and your tests are completely normal, you will receive your results only by: MyChart Message (if you have MyChart) OR A paper copy in the mail If you have any lab test that is abnormal or we need to change your treatment, we will call you to review the results.   Testing/Procedures:  Your physician has requested that you have an echocardiogram. Echocardiography is a painless test that uses sound waves to create images of your heart. It provides your doctor with information about the size and shape of your heart and how well your heart's chambers and valves are working. This procedure takes approximately one hour. There are no restrictions for this procedure. Please do NOT wear cologne, aftershave, or lotions (deodorant is allowed). Please arrive 15 minutes prior to your appointment time.  Please note: We ask at that you not bring children with you during ultrasound (echo/ vascular) testing. Due to room size and safety concerns, children are not allowed in the ultrasound rooms during exams. Our front office staff cannot provide observation of children in our lobby area while testing is being conducted. An adult accompanying a patient to their appointment will only be allowed in the ultrasound room at the discretion of the ultrasound technician under special circumstances. We apologize for any inconvenience.    Follow-Up: At [facility], you and your health needs are our priority.  As part of our continuing mission to provide you with exceptional heart care, we have created designated Provider Care Teams.  These Care Teams  include your primary Cardiologist (physician) and Advanced Practice Providers (APPs -  Physician Assistants and Nurse Practitioners) who all work together to provide you with the care you need, when you need it.  We recommend signing up for the patient portal called MyChart.  Sign up information is provided on this After Visit Summary.  MyChart is used to connect with patients for Virtual Visits (Telemedicine).  Patients are able to view lab/test results, encounter notes, upcoming appointments, etc.  Non-urgent messages can be sent to your provider as well.   To learn more about what you can do with MyChart, go to [email or web address].    Your next appointment:   4 month(s)  Provider:   Aleene Passe, MD     Other Instructions    1st Floor: - Lobby - Registration  - Pharmacy  - Lab - Cafe  2nd Floor: - PV Lab - Diagnostic Testing (echo, CT, nuclear med)  3rd Floor: - Vacant  4th Floor: - TCTS (cardiothoracic surgery) - AFib Clinic - Structural Heart Clinic - Vascular Surgery  - Vascular Ultrasound  5th Floor: - HeartCare Cardiology (general and EP) - Clinical Pharmacy for coumadin, hypertension, lipid, weight-loss medications, and med management appointments    Valet parking services will be available as well.

## 2023-12-03 NOTE — Telephone Encounter (Signed)
 Received staff message:    From: Percy Rosaline HERO, NP Sent: 12/03/2023  10:58 AM EST To: Rx Prior Auth Team  Hi,  This pt says his Eliquis  has been $270 for 3 months since it was prescribed. Just seems unusual. Could you take a look to see if there is anything we need to do or if Xarelto would be a preferred option?  Thank you! Rosaline  Wrote back: Hello,  Patients eliquis  is saying Too soon until 01/12/24 last filled 11/05/23 for 90 days so I am unable to run a test claim to see how much his copay would be now. I did run a test claim for xarelto 10mg  for 30 days and copay would be 383.00 because of deductible.   Thank you

## 2023-12-26 ENCOUNTER — Other Ambulatory Visit (HOSPITAL_BASED_OUTPATIENT_CLINIC_OR_DEPARTMENT_OTHER): Payer: Self-pay | Admitting: *Deleted

## 2023-12-26 ENCOUNTER — Ambulatory Visit (HOSPITAL_COMMUNITY): Payer: Medicare Other | Attending: Internal Medicine

## 2023-12-26 DIAGNOSIS — I10 Essential (primary) hypertension: Secondary | ICD-10-CM

## 2023-12-26 DIAGNOSIS — I350 Nonrheumatic aortic (valve) stenosis: Secondary | ICD-10-CM

## 2023-12-26 DIAGNOSIS — E785 Hyperlipidemia, unspecified: Secondary | ICD-10-CM

## 2023-12-26 DIAGNOSIS — I4811 Longstanding persistent atrial fibrillation: Secondary | ICD-10-CM

## 2023-12-26 DIAGNOSIS — I2510 Atherosclerotic heart disease of native coronary artery without angina pectoris: Secondary | ICD-10-CM | POA: Insufficient documentation

## 2023-12-26 LAB — ECHOCARDIOGRAM COMPLETE
AR max vel: 0.95 cm2
AV Area VTI: 0.98 cm2
AV Area mean vel: 0.91 cm2
AV Mean grad: 8 mm[Hg]
AV Peak grad: 12.1 mm[Hg]
Ao pk vel: 1.74 m/s
Est EF: 50
S' Lateral: 3.2 cm

## 2023-12-26 MED ORDER — SPIRONOLACTONE 25 MG PO TABS: 12.5000 mg | ORAL_TABLET | Freq: Every day | ORAL | 3 refills | Status: DC

## 2024-01-03 ENCOUNTER — Other Ambulatory Visit: Payer: Self-pay | Admitting: Cardiovascular Disease

## 2024-02-16 ENCOUNTER — Other Ambulatory Visit: Payer: Self-pay | Admitting: Cardiovascular Disease

## 2024-02-16 DIAGNOSIS — I4891 Unspecified atrial fibrillation: Secondary | ICD-10-CM

## 2024-02-18 NOTE — Telephone Encounter (Signed)
 Prescription refill request for Eliquis received. Indication:afib Last office visit:1/25 Scr:1.05  1/25 Age: 83 Weight:80  kg  Prescription refilled

## 2024-03-31 ENCOUNTER — Encounter: Payer: Self-pay | Admitting: Cardiovascular Disease

## 2024-03-31 NOTE — Progress Notes (Unsigned)
 Cardiology Office Note   Date:  04/01/2024   ID:  Anthony Carpenter, DOB [birth year], MRN [id number]  PCP:  Wyn Heater, MD  Cardiologist:   Ahmad Alert, MD   Chief Complaint  Patient presents with   Aortic Stenosis   Atrial Fibrillation         1. Coronary artery disease-status post PTCA and stenting of his mid LAD.  ( February 11, 2015)    We placed a 2.75 x 20 mm Taxus stent. It was post dilated using a 3.0 Quantum Maverick, 07/28/05  2. Hyperlipidemia-    3. MIld AS :  4.  Atrial fib     Anthony Carpenter  is doing well.  No chest pain or dyspnea.  He plans on putting in a garden this summer .  He's building his house this year.  He was admitted to the hospital with some chest pain/abdominal pain. A Myoview  study did not reveal any ischemia.  He has felt better.  No further episodes of chest pain.  He forgot to take his meds this am.  Feb. 26, 2014: Anthony Carpenter  is doing well.  He has not had any angina.  He remains active - still building houses.   July 14, 2013:  Anthony Carpenter is doing  Well.  No angina.    July 14, 2014:  Anthony Carpenter is doing well.  Still working   No CP, no dyspnea.    Dec. 11, 2015;  Anthony Carpenter was in the hospital for CP recently.  He was having vague chest discomfort.   Cath showed a 60 % instent restenosis.   Pains have resolved.    He is back doing all of his normal activities.  BP has been lower     February 05, 2015:  Anthony Carpenter is a 83 y.o. male who presents for some CP and DOE for the past month. Occurs when he is working outside.   Mowing, speading fertilizer.   Will last as long as he's working . Eases up if he stops.   March 17, 2015:  Anthony Carpenter is a 83 y.o. male who presents for follow up of an episode of unstable angina.  I saw him in March for symptoms worisome of UAP.  CAth showed.   Left main: No obstructive disease.    Left Anterior Descending Artery: Large caliber vessel that courses to the apex. The mid segment is stented. The old stent has 99%  restenosis. The distal vessel is free of obstructive disease. The Diagonal branch is moderate in caliber with ostial 30% stenosis where the vessel is jailed by the LAD stent.   Circumflex Artery: Large caliber vessel with moderate caliber obtuse marginal branch. The obtuse marginal branch has proximal 50% stenosis.   Right Coronary Artery: Large dominant vessel with 30% mid stenosis.  Left Ventricular Angiogram: LVEF=45-50% with hypokinesis of the anterior wall and apex.  He had 3.0 x 16 mm Promus Premier DES in the mid LAD. The stent was post-dilated with a 3.25 x 12 mm Kelly balloon x 1. The stenosis was taken from 99% down to 0%.   He is feeling much better.   Not shortness of breath   Participating in cardiac rehab. Bruising from the plavix .   But no real PC   Oct. 24, 2016:  Anthony Carpenter is doing well. Walking regularly  Has several projects doing on. Developing some land  Has some bruising and occasional nose bleeds.   March 13, 2016:  Staying active.  Occasional nose bleeds   Oct. 23, 2017:  Doing well .  No CP or dyspnea. Cooked 80 gallons of [place] stew this weekend  Has occasional nose bleed   Mar 28, 2017:  Doing well.   No angina Working on his housing development   October 04, 2017:  Doing well .   Has lots of bruising but not so much nosebleeding. We have stopped the ASA and continued the plavix .    May 20, 2018: Anthony Carpenter is seen today for follow-up of his coronary artery disease and hyperlipidemia No CP ,  No dyspnea.   Still bruising.   Is still on Plavix ,   Is off ASA  No recent nose bleeds  Jan. 6, 2020 Has mild DOE if he exrcises vigorously No return of the angina he had last year  June 02, 2019 :  Has CAD.   Was having angina,  Started Imdur .  Pain significantly improved. Not able to do as much as he thinks he would do .  Walks 1/2 mile without any problems.  Can climb 2 flights of stairs.   Has not needed NTG recently   September 04, 2019:  Anthony Carpenter is  seen today for a follow-up visit.  He has a history of coronary artery disease.  When I last saw him in July he was having some unusual types of chest pains.  He was able to exercise without too much difficulty.  He did not want have a heart catheterization but wanted to be reevaluated in several months and returns today for further evaluation.  Still has some CP if he "rushes" too much   Has some angina - not as bad as his previous CP.  Imdur  seems to help   March 02, 2020: Anthony Carpenter is seen today for follow-up of his coronary artery disease.  He has had some unusual type of chest pains in the past but he did not want to have a heart catheterization. Blood pressure and heart rate remain well controlled. Can walk for abut a mile ,   Gets out of breath if he rushes too much .  Has not had to take a NTG .   Oct. 14, 2021: Anthony Carpenter is seen today for follow up of his CAD, hyperlipidemia No CP. 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 Wants to try Sildenafil   Will DC his Imdur    Feb. 17, 2023 Anthony Carpenter is seen today for follow up of his CAD He has been having more angina like chest pain recently  Added Imdur  - which has helped    Oct. 16,,2023  Anthony Carpenter is seen for follow up of his CAD Was having symptoms of UAP when I saw him Repeat cath feb. 2023 showed widely patent stents. No CP, does have some DOE   Will check labs - CBC, BMP, lipids, LFTs Will get an echo - his last echo from 2018 showed mild AS ( mean AV gradient of 9 mmHg)    Feb. 19, 2024 Anthony Carpenter is seen today for follow up of his CAD , mild AS  Has chronic shortness of breath with exercise Walks without any difficulty  He is in atrial fib today . Cannot  tell that his HR is irreg.  This Afib may explain his dyspnea    February 05, 2023 Anthony Carpenter is seen for follow up visit for his CAD, mild AS Newly diagnosed Afib  Had cardioversion on February 21 2023 but it was unsuccessful.   He is seen for follow up of his atrial fib and to schedule cardioversion if he remains in atrial fib  May 21, 2023  Seen with his wife .  Anthony Carpenter is seen for follow up of his CAD, mild, AS, atrial fib,  Leg edema  Is on eliquis  , no bleeding   BP Is on the low side  .   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 clinic  Alvenia Aus ) instead  Overall he is feeling well.  He remains in atrial fibrillation but states that he is doing great.  Apr 01, 2024  Anthony Carpenter is seen for follow up of his CAD, atrial fib, mild AS  Not much leg swelling .   BP is slow,  he's not on any rate slowing meds  He avoid salt for the most part  His last lipids look great   Latest Reference Range & Units 12/03/23 10:56  Total CHOL/HDL Ratio 0.0 - 5.0 ratio 2.5  Cholesterol, Total 100 - 199 mg/dL 102  HDL Cholesterol >72 mg/dL 44  Triglycerides 0 - 536 mg/dL 58  VLDL Cholesterol Cal 5 - 40 mg/dL 13  LDL Chol Calc (NIH) 0 - 99 mg/dL 54     Is building a small house for him and his wife     Past Medical History:  Diagnosis Date   Aortic stenosis, mild    Bicuspid aortic valve    Bradycardia    Carotid bruit present    a. Right side   Coronary artery disease    a. CAD s/p PTCA/Taxus stent to mid-prox LAD 2006  b.  cath 10/02/14 with 60% focal instent restenosis in LAD prox to the Diag takeoff, 30% mid AV groove left circ stenosis and luminal irregularities in a large dominant RCA. FFR of LAD was 0.85 and not felt to be HD significant to warrant PCI.    Glaucoma    Hyperlipidemia    Hypertension    Myocardial infarction [facility]) 2006   Pulmonary nodule seen on imaging  study    a. seen on CT during 09/2014 admission, may require follow up imaging     Past Surgical History:  Procedure Laterality Date   CARDIAC CATHETERIZATION  07/28/2005   Est EF of 55% -- Single-vessel obstructive disease in the proximal/mid left anterior descending -- Preserved left ventricular systolic function with good anterior wall motion   CARDIOVERSION N/A 02/21/2023   Procedure: CARDIOVERSION;  Surgeon: Sonny Dust, MD;  Location: [facility] ENDOSCOPY;  Service: Cardiovascular;  Laterality: N/A;   cataracts  5-6 years ago   COLONOSCOPY  ? 2009   CORONARY ANGIOPLASTY WITH STENT PLACEMENT  07/28/2005   Successful PTCA and stenting of the mid and proximal left anterior descending artery.  The patient is stable leaving the catheterization laboratory -- [place], M.D.     CORONARY STENT PLACEMENT  02/11/2015   3.0 x 16 mm Promus Premier DES to the mLAD for ISR   LEFT HEART  CATH AND CORONARY ANGIOGRAPHY N/A 01/10/2022   Procedure: LEFT HEART CATH AND CORONARY ANGIOGRAPHY;  Surgeon: Arleen Lacer, MD;  Location: [facility] INVASIVE CV LAB;  Service: Cardiovascular;  Laterality: N/A;   LEFT HEART CATHETERIZATION WITH CORONARY ANGIOGRAM N/A 10/02/2014   Procedure: LEFT HEART CATHETERIZATION WITH CORONARY ANGIOGRAM;  Surgeon: Millicent Ally, MD;  Location: [facility] CATH LAB;  Service: Cardiovascular;  Laterality: N/A;   LEFT HEART CATHETERIZATION WITH CORONARY ANGIOGRAM N/A 02/11/2015   Procedure: LEFT HEART CATHETERIZATION WITH CORONARY ANGIOGRAM;  Surgeon: Odie Benne, MD; LAD 99% ISR, CFX okay, OM1 50%, RCA 30%, D1 30% (jailed by the LAD stent), EF 45-50 %   THULIUM LASER TURP (TRANSURETHRAL RESECTION OF PROSTATE) N/A 08/06/2018   Procedure: THULIUM LASER TURP (TRANSURETHRAL RESECTION OF PROSTATE);  Surgeon: Christina Coyer, MD;  Location: [facility] ORS;  Service: Urology;  Laterality: N/A;     Current Outpatient Medications  Medication Sig Dispense Refill   atorvastatin  (LIPITOR ) 80  MG tablet TAKE 1 TABLET BY MOUTH DAILY AT  6 PM 90 tablet 3   dorzolamide -timolol  (COSOPT ) 22.3-6.8 MG/ML ophthalmic solution Place 1 drop into both eyes 2 (two) times daily.      ELIQUIS  5 MG TABS tablet TAKE 1 TABLET BY MOUTH TWICE A DAY 180 tablet 3   latanoprost  (XALATAN ) 0.005 % ophthalmic solution Place 1 drop into both eyes at bedtime.     Multiple Vitamins-Minerals (ICAPS AREDS FORMULA PO) Take 1 tablet by mouth 2 (two) times daily.      nitroGLYCERIN  (NITROSTAT ) 0.4 MG SL tablet Place 1 tablet (0.4 mg total) under the tongue every 5 (five) minutes as needed. For chest pain. 25 tablet 12   spironolactone  (ALDACTONE ) 25 MG tablet Take 0.5 tablets (12.5 mg total) by mouth daily. 45 tablet 3   Tiotropium Bromide-Olodaterol (STIOLTO RESPIMAT ) 2.5-2.5 MCG/ACT AERS Inhale 2 puffs into the lungs daily. 4 g 0   albuterol  (VENTOLIN  HFA) 108 (90 Base) MCG/ACT inhaler Inhale 2 puffs into the lungs every 6 (six) hours as needed for wheezing or shortness of breath. (Patient not taking: Reported on 04/01/2024) 8 g 2   No current facility-administered medications for this visit.    Allergies:   Patient has no known allergies.    Social History:  The patient  reports that he has never smoked. He has never used smokeless tobacco. He reports that he does not drink alcohol and does not use drugs.   Family History:  The patient's family history includes Cancer in an other family member; Lung cancer in his father.    ROS:   Noted in current history, otherwise review of systems is negative.    Physical Exam: Blood pressure 122/60, pulse (!) 49, height 5\' 10"  (1.778 m), weight 169 lb 12.8 oz (77 kg), SpO2 98%.       GEN:  elderly male  in no acute distress HEENT: Normal NECK: No JVD; No carotid bruits LYMPHATICS: No lymphadenopathy CARDIAC: irreg. Irreg. 1-2/6 systolic murmur  RESPIRATORY:  Clear to auscultation without rales, wheezing or rhonchi  ABDOMEN: Soft, non-tender,  non-distended MUSCULOSKELETAL:  No edema;  chronic stasis changes.  SKIN: Warm and dry NEUROLOGIC:  Alert and oriented x 3      EKG:    EKG Interpretation Date/Time:  Tuesday Apr 01 2024 09:01:35 EDT Ventricular Rate:  49 PR Interval:    QRS Duration:  82 QT Interval:  460 QTC Calculation: 415 R Axis:   108  Text Interpretation: Atrial fibrillation with slow ventricular  response Rightward axis Low voltage QRS Cannot rule out Anterior infarct , age undetermined When compared with ECG of 07-Feb-2023 23:59, Ventricular rate is slower Confirmed by Ahmad Alert [phone]) on 04/01/2024 9:25:21 AM     Recent Labs: 12/03/2023: ALT 16; BUN 14; Creatinine, Ser 1.05; Potassium 4.1; Sodium 146    Lipid Panel    Component Value Date/Time   CHOL 111 12/03/2023 1056   TRIG 58 12/03/2023 1056   HDL 44 12/03/2023 1056   CHOLHDL 2.5 12/03/2023 1056   CHOLHDL 2.8 09/11/2016 0904   VLDL 14 09/11/2016 0904   LDLCALC 54 12/03/2023 1056      Wt Readings from Last 3 Encounters:  04/01/24 169 lb 12.8 oz (77 kg)  12/03/23 176 lb 6.4 oz (80 kg)  09/28/23 170 lb 9.6 oz (77.4 kg)      Other studies Reviewed: Additional studies/ records that were reviewed today include: . Review of the above records demonstrates:    ASSESSMENT AND PLAN:   1. Coronary artery disease-     no angina .  Cont meds.  Lipids look good  .  2. Atrial fib:    has persistent afib .  On eliquis  . HR is slow.  No syncope     3.  Generalized fatigue:       4.  Upper respiratory tori tract infection:     5.  HTN:    BP is well controlled.    Current medicines are reviewed at length with the patient today.  The patient does not have concerns regarding medicines.  The following changes have been made:  no change  Labs/ tests ordered today include:   Orders Placed This Encounter  Procedures   EKG 12-Lead      Disposition:       Signed, Ahmad Alert, MD  04/01/2024 9:29 AM    [facility] [address] [place], [place], [place]  [address] Phone: [phone]; Fax: [phone]

## 2024-04-01 ENCOUNTER — Ambulatory Visit: Payer: Medicare Other | Attending: Cardiovascular Disease | Admitting: Cardiovascular Disease

## 2024-04-01 ENCOUNTER — Encounter: Payer: Self-pay | Admitting: Cardiovascular Disease

## 2024-04-01 VITALS — BP 122/60 | HR 49 | Ht 70.0 in | Wt 169.8 lb

## 2024-04-01 DIAGNOSIS — E785 Hyperlipidemia, unspecified: Secondary | ICD-10-CM

## 2024-04-01 DIAGNOSIS — I4811 Longstanding persistent atrial fibrillation: Secondary | ICD-10-CM

## 2024-04-01 DIAGNOSIS — I10 Essential (primary) hypertension: Secondary | ICD-10-CM | POA: Diagnosis not present

## 2024-04-01 DIAGNOSIS — I2510 Atherosclerotic heart disease of native coronary artery without angina pectoris: Secondary | ICD-10-CM

## 2024-04-01 DIAGNOSIS — I350 Nonrheumatic aortic (valve) stenosis: Secondary | ICD-10-CM

## 2024-04-01 NOTE — Patient Instructions (Signed)
 Follow-Up: At [facility], you and your health needs are our priority.  As part of our continuing mission to provide you with exceptional heart care, our providers are all part of one team.  This team includes your primary Cardiologist (physician) and Advanced Practice Providers or APPs (Physician Assistants and Nurse Practitioners) who all work together to provide you with the care you need, when you need it.  Your next appointment:   1 year(s)  Provider:   Ahmad Alert, MD

## 2024-10-01 ENCOUNTER — Ambulatory Visit: Admitting: Internal Medicine

## 2024-10-15 ENCOUNTER — Ambulatory Visit: Attending: Internal Medicine | Admitting: Internal Medicine

## 2024-10-15 ENCOUNTER — Encounter: Payer: Self-pay | Admitting: Internal Medicine

## 2024-10-15 VITALS — BP 115/78 | HR 67 | Resp 17 | Ht 70.0 in | Wt 162.0 lb

## 2024-10-15 DIAGNOSIS — I350 Nonrheumatic aortic (valve) stenosis: Secondary | ICD-10-CM

## 2024-10-15 DIAGNOSIS — I4891 Unspecified atrial fibrillation: Secondary | ICD-10-CM

## 2024-10-15 DIAGNOSIS — I25119 Atherosclerotic heart disease of native coronary artery with unspecified angina pectoris: Secondary | ICD-10-CM | POA: Diagnosis not present

## 2024-10-15 DIAGNOSIS — I5022 Chronic systolic (congestive) heart failure: Secondary | ICD-10-CM | POA: Insufficient documentation

## 2024-10-15 DIAGNOSIS — R001 Bradycardia, unspecified: Secondary | ICD-10-CM | POA: Diagnosis not present

## 2024-10-15 NOTE — Patient Instructions (Signed)
 Medication Instructions:  No medication changes were made at this visit. Continue current regimen.  *If you need a refill on your cardiac medications before your next appointment, please call your pharmacy*  Testing/Procedures: Your physician has requested that you have an echocardiogram in 12/2024. Echocardiography is a painless test that uses sound waves to create images of your heart. It provides your doctor with information about the size and shape of your heart and how well your heart's chambers and valves are working. This procedure takes approximately one hour. There are no restrictions for this procedure. Please do NOT wear cologne, perfume, aftershave, or lotions (deodorant is allowed). Please arrive 15 minutes prior to your appointment time.  Please note: We ask at that you not bring children with you during ultrasound (echo/ vascular) testing. Due to room size and safety concerns, children are not allowed in the ultrasound rooms during exams. Our front office staff cannot provide observation of children in our lobby area while testing is being conducted. An adult accompanying a patient to their appointment will only be allowed in the ultrasound room at the discretion of the ultrasound technician under special circumstances. We apologize for any inconvenience.   Follow-Up: At [facility], you and your health needs are our priority.  As part of our continuing mission to provide you with exceptional heart care, our providers are all part of one team.  This team includes your primary Cardiologist (physician) and Advanced Practice Providers or APPs (Physician Assistants and Nurse Practitioners) who all work together to provide you with the care you need, when you need it.  Your next appointment:   6 month(s)  Provider:   Emeline FORBES Calender, DO    We recommend signing up for the patient portal called MyChart.  Sign up information is provided on this After Visit Summary.  MyChart is  used to connect with patients for Virtual Visits (Telemedicine).  Patients are able to view lab/test results, encounter notes, upcoming appointments, etc.  Non-urgent messages can be sent to your provider as well.   To learn more about what you can do with MyChart, go to [email or web address].

## 2024-10-15 NOTE — Addendum Note (Signed)
 Addended by: KRASOWSKI, Kaelyn Innocent on: 10/15/2024 10:54 AM   Modules accepted: Orders

## 2024-10-15 NOTE — Progress Notes (Signed)
 Cardiology Office Note   Date:  10/15/2024  ID:  ADEM COSTLOW, DOB 1941/08/22, MRN [id number] PCP: Nanci Senior, MD   [facility] Providers Cardiologist:  Aleene Passe, MD (Inactive)     History of Present Illness Anthony Carpenter is a 83 y.o. male who is a prior patient of Dr. 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  Per discharge summary, patient's daughter reported the patient and his wife left their home in [place]  and never returned prompting them to track their location on the phone to a gas station in [place] .  The patient was disoriented looking at a house with plans for doing night work on the job site.  In the ED, patient afebrile and hemodynamically stable (hypertensive 160s/80s), saturating mid 90s on room air. ECG demonstrates normal sinus rhythm. CTA head and neck negative for acute process. CT head negative for acute process. CXR demonstrates no acute process of. Labs demonstrate: WBC 7.1, hemoglobin 13.8, platelets 117, CMP grossly unremarkable, UA not reflexed to culture, serum ethanol undetectable, UDS pan negative.  Noted to be persistently bradycardic with heart rates into the low 30s and was in the ICU the evening of 10/26 but did not require any intervention for the bradycardia due to his chronic slow heart rate.  Patient and family left AMA before and EEG was completed to rule out seizures and prior to a cardiology consult.  Today he presents with his daughter and wife to reestablish since Dr. Passe retired.  He has no complaints and family states he is back to baseline now.  No chest pain, shortness of breath, lower extremity swelling, syncope or  presyncopal symptoms.  ROS:  Review of Systems  All other systems reviewed and are negative.   Physical Exam  Physical Exam Vitals and nursing note reviewed.  Constitutional:      Appearance: Normal appearance.  HENT:     Head: Normocephalic and atraumatic.  Eyes:     Conjunctiva/sclera: Conjunctivae normal.  Cardiovascular:     Rate and Rhythm: Bradycardia present. Rhythm irregular.     Heart sounds: Murmur heard.  Pulmonary:     Effort: Pulmonary effort is normal.     Breath sounds: Normal breath sounds.  Musculoskeletal:     Comments: Trace lower extremity edema with chronic skin changes  Skin:    Coloration: Skin is not jaundiced or pale.  Neurological:     Mental Status: He is alert.     VS:  BP 115/78 (BP Location: Left Arm, Patient Position: Sitting, Cuff Size: Normal)   Pulse 67   Resp 17   Ht 5' 10 (1.778 m)   Wt 162 lb (73.5 kg)   SpO2 95%   BMI 23.24 kg/m         Wt Readings from Last 3 Encounters:  10/15/24 162 lb (73.5 kg)  04/01/24 169 lb 12.8 oz (77 kg)  12/03/23 176 lb 6.4 oz (80 kg)     EKG Interpretation Date/Time:  Wednesday October 15 2024 10:00:59 EST Ventricular Rate:  55 PR Interval:    QRS Duration:  82 QT Interval:  448 QTC Calculation: 428 R Axis:   51  Text Interpretation: Atrial fibrillation with slow ventricular response Low  voltage QRS When compared with ECG of 01-Apr-2024 09:01, No significant change since Confirmed by Kriste Hicks [phone]) on 10/15/2024 10:02:40 AM    Studies Reviewed   Echocardiogram 12/26/2023:  1. Left ventricular ejection fraction, by estimation, is 50%. The left  ventricle has mildly decreased function. The left ventricle demonstrates  global hypokinesis. Left ventricular diastolic parameters are  indeterminate.   2. Right ventricular systolic function is mildly reduced. The right  ventricular size is mildly enlarged. There is moderately elevated  pulmonary artery systolic pressure. The  estimated right ventricular  systolic pressure is 45.0 mmHg.   3. Left atrial size was mildly dilated.   4. Right atrial size was mildly dilated.   5. The mitral valve is normal in structure. Mild mitral valve  regurgitation. No evidence of mitral stenosis.   6. The aortic valve is tricuspid. There is severe calcifcation of the  aortic valve. Aortic valve regurgitation is trivial. At least mild aortic  valve stenosis visually though aortic valve mean gradient measures 8.0  mmHg.  SVI: 20  7. Aortic dilatation noted. There is mild dilatation of the aortic root,  measuring 38 mm. There is mild dilatation of the ascending aorta,  measuring 38 mm.   8. The inferior vena cava is dilated in size with <50% respiratory  variability, suggesting right atrial pressure of 15 mmHg.      Risk Assessment/Calculations   CHA2DS2-VASc Score = 4   This indicates a 4.8% annual risk of stroke. The patient's score is based upon: CHF History: 0 HTN History: 1 Diabetes History: 0 Stroke History: 0 Vascular Disease History: 1 Age Score: 2 Gender Score: 0             ASCVD risk score: The ASCVD Risk score (Arnett DK, et al., 2019) failed to calculate for the following reasons:   The 2019 ASCVD risk score is only valid for ages 74 to 65   Risk score cannot be calculated because patient has a medical history suggesting prior/existing ASCVD   ASSESSMENT  Likely mild to moderate aortic stenosis asymptomatic without heart failure symptoms.  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  No indication for intervention at this time Chronic HFmrEF personal review of echocardiogram appears to have mildly reduced ejection fraction from 45 to 50% with global hypokinesis.  Currently not on GDMT due to HR and BP. Atrial fibrillation with chronic slow ventricular response on Eliquis  5 mg twice daily.  Hold AV nodal blocking agents CAD s/p DES  to mid LAD in 2016 on chronic Eliquis .  Not on a statin.  LDL very well-controlled Recent episode of altered mental status unclear etiology, appears back to baseline  Plan  Continue current management Echocardiogram in 3 months (1 year from prior echo in 2024)  Follow up: 6 months          Signed, Hicks FORBES Kriste, DO

## 2024-11-09 ENCOUNTER — Other Ambulatory Visit: Payer: Self-pay

## 2024-11-09 ENCOUNTER — Emergency Department (HOSPITAL_BASED_OUTPATIENT_CLINIC_OR_DEPARTMENT_OTHER)

## 2024-11-09 ENCOUNTER — Emergency Department (HOSPITAL_BASED_OUTPATIENT_CLINIC_OR_DEPARTMENT_OTHER)
Admission: EM | Admit: 2024-11-09 | Discharge: 2024-11-09 | Disposition: A | Discharge status: Home / Self Care | Attending: Emergency Medicine | Admitting: Emergency Medicine

## 2024-11-09 ENCOUNTER — Emergency Department (HOSPITAL_BASED_OUTPATIENT_CLINIC_OR_DEPARTMENT_OTHER): Admitting: Radiology

## 2024-11-09 DIAGNOSIS — D689 Coagulation defect, unspecified: Secondary | ICD-10-CM | POA: Insufficient documentation

## 2024-11-09 DIAGNOSIS — Z7901 Long term (current) use of anticoagulants: Secondary | ICD-10-CM | POA: Diagnosis not present

## 2024-11-09 DIAGNOSIS — S0990XA Unspecified injury of head, initial encounter: Secondary | ICD-10-CM | POA: Diagnosis not present

## 2024-11-09 DIAGNOSIS — S32050A Wedge compression fracture of fifth lumbar vertebra, initial encounter for closed fracture: Secondary | ICD-10-CM

## 2024-11-09 DIAGNOSIS — S61412A Laceration without foreign body of left hand, initial encounter: Secondary | ICD-10-CM | POA: Insufficient documentation

## 2024-11-09 DIAGNOSIS — S32058A Other fracture of fifth lumbar vertebra, initial encounter for closed fracture: Secondary | ICD-10-CM | POA: Diagnosis not present

## 2024-11-09 DIAGNOSIS — W19XXXA Unspecified fall, initial encounter: Secondary | ICD-10-CM | POA: Diagnosis not present

## 2024-11-09 DIAGNOSIS — M549 Dorsalgia, unspecified: Secondary | ICD-10-CM | POA: Diagnosis present

## 2024-11-09 DIAGNOSIS — I6782 Cerebral ischemia: Secondary | ICD-10-CM | POA: Diagnosis not present

## 2024-11-09 DIAGNOSIS — S61419A Laceration without foreign body of unspecified hand, initial encounter: Secondary | ICD-10-CM

## 2024-11-09 MED ORDER — TRAMADOL HCL 50 MG PO TABS: 50.0000 mg | ORAL_TABLET | Freq: Four times a day (QID) | ORAL | 0 refills | Status: AC | PRN

## 2024-11-09 MED ORDER — LIDOCAINE-EPINEPHRINE-TETRACAINE (LET) TOPICAL GEL
3.0000 mL | Freq: Once | TOPICAL | Status: AC
  Administered 2024-11-09: 3 mL via TOPICAL
  Filled 2024-11-09: qty 3

## 2024-11-09 MED ORDER — TRAMADOL HCL 50 MG PO TABS
50.0000 mg | ORAL_TABLET | Freq: Once | ORAL | Status: AC
  Administered 2024-11-09: 50 mg via ORAL
  Filled 2024-11-09: qty 1

## 2024-11-09 NOTE — ED Notes (Signed)
 DC paperwork given and verbally understood.

## 2024-11-09 NOTE — ED Triage Notes (Signed)
 Pt to ED after mechanical fall this afternoon. Skin tear to left hand, bleeding under control. Back pain without or neck injury. Pt on Eliquis 

## 2024-11-09 NOTE — ED Provider Notes (Signed)
 " [place] EMERGENCY DEPARTMENT AT [facility] Provider Note   CSN: [id number] Arrival date & time: 11/09/24  1556     Patient presents with: Anthony Carpenter is a 83 y.o. male.   Patient to ED after fall while helping his wife just prior to arrival. He states he lost his balance and fell backward onto carpeted floor. He did not hit his head and denies neck pain. He was able to get up from the floor and has been ambulatory. He reports pain in his back and a bleeding wound to the left hand where it skimmed the carpeted area. He is an Eliquis . No chest pain, abdominal pain, nausea.   The history is provided by the patient. No language interpreter was used.  Fall       Prior to Admission medications  Medication Sig Start Date End Date Taking? Authorizing Provider  traMADol  (ULTRAM ) 50 MG tablet Take 1 tablet (50 mg total) by mouth every 6 (six) hours as needed. 11/09/24  Yes Odell Balls, PA-C  albuterol  (VENTOLIN  HFA) 108 (90 Base) MCG/ACT inhaler Inhale 2 puffs into the lungs every 6 (six) hours as needed for wheezing or shortness of breath. Patient not taking: Reported on 10/15/2024 02/05/23   Nahser, Aleene PARAS, MD  atorvastatin  (LIPITOR ) 80 MG tablet TAKE 1 TABLET BY MOUTH DAILY AT  6 PM 01/04/24   Nahser, Aleene PARAS, MD  dorzolamide -timolol  (COSOPT ) 22.3-6.8 MG/ML ophthalmic solution Place 1 drop into both eyes 2 (two) times daily.     [provider]  ELIQUIS  5 MG TABS tablet TAKE 1 TABLET BY MOUTH TWICE A DAY 02/18/24   Nahser, Aleene PARAS, MD  latanoprost  (XALATAN ) 0.005 % ophthalmic solution Place 1 drop into both eyes at bedtime.    [provider]  Multiple Vitamins-Minerals (ICAPS AREDS FORMULA PO) Take 1 tablet by mouth 2 (two) times daily.     [provider]  nitroGLYCERIN  (NITROSTAT ) 0.4 MG SL tablet Place 1 tablet (0.4 mg total) under the tongue every 5 (five) minutes as needed. For chest pain. 03/06/22   Swinyer, Rosaline HERO, NP   spironolactone  (ALDACTONE ) 25 MG tablet Take 0.5 tablets (12.5 mg total) by mouth daily. Patient not taking: Reported on 10/15/2024 12/26/23   Swinyer, Rosaline HERO, NP  Tiotropium Bromide-Olodaterol (STIOLTO RESPIMAT ) 2.5-2.5 MCG/ACT AERS Inhale 2 puffs into the lungs daily. Patient not taking: Reported on 10/15/2024 04/10/23   Hunsucker, Donnice JONELLE, MD    Allergies: Patient has no known allergies.    Review of Systems  Updated Vital Signs BP (!) 147/84 (BP Location: Right Arm)   Pulse 77   Temp (!) 96.6 F (35.9 C) (Temporal)   Resp 18   SpO2 93%   Physical Exam Vitals and nursing note reviewed.  HENT:     Head: Normocephalic and atraumatic.     Comments: No facial or scalp swelling, redness, wound, hematoma or tenderness.  Eyes:     Conjunctiva/sclera: Conjunctivae normal.  Cardiovascular:     Rate and Rhythm: Normal rate.  Pulmonary:     Effort: Pulmonary effort is normal.  Chest:     Chest wall: No tenderness.  Abdominal:     Palpations: Abdomen is soft.     Tenderness: There is no abdominal tenderness. There is no right CVA tenderness or left CVA tenderness.     Comments: No bruising of abdominal wall.   Musculoskeletal:     Cervical back: Normal range of motion and neck supple.  Back:     Comments: Mild midline lower thoracic tenderness without swelling or abrasion. There is a superficial, very minimal abrasion to the right mid-back. No CVA tenderness.      (all labs ordered are listed, but only abnormal results are displayed) Labs Reviewed - No data to display  EKG: None  Radiology: No results found.   .Laceration Repair  Date/Time: 11/09/2024 6:19 PM  Performed by: Odell Balls, PA-C Authorized by: Odell Balls, PA-C   Consent:    Consent obtained:  Verbal   Consent given by:  Patient Universal protocol:    Procedure explained and questions answered to patient or proxy's satisfaction: yes     Patient identity confirmed:  Verbally with  patient Anesthesia:    Anesthesia method:  Topical application   Topical anesthetic:  LET Laceration details:    Location:  Hand   Hand location:  L hand, dorsum   Length (cm):  8 (Skin tear) Exploration:    Contaminated: no   Treatment:    Area cleansed with:  Saline and povidone-iodine   Amount of cleaning:  Standard Skin repair:    Repair method:  Tissue adhesive and Steri-Strips   Number of Steri-Strips:  6 Approximation:    Approximation:  Loose Repair type:    Repair type:  Simple Post-procedure details:    Dressing:  Non-adherent dressing    Medications Ordered in the ED  traMADol  (ULTRAM ) tablet 50 mg (50 mg Oral Given 11/09/24 1812)  lidocaine -EPINEPHrine -tetracaine  (LET) topical gel (3 mLs Topical Given by Other 11/09/24 1936)    Clinical Course as of 11/14/24 2351  Sun Nov 09, 2024  1628 Patient to ED after mechanical fall with skin tear to dorsal left hand. There is mild active bleeding. Do not suspect fracture. Head and neck atraumatic - will obtain CT out of abundance of caution as the patient is anticoagulated. Mild thoracic tenderness. Patient is able to get himself up from sitting and walk to the bed without limitation. Imaging pending.  [SU]  1845 Head and neck CT negative. There is a compression fracture of L5 that appears new. Per radiology interpretation of lumbar and thoracic films: IMPRESSION: 1. Chronic compression fracture at T12. 2. Mild age indeterminate superior endplate deformities at L1 and compression deformity of L5 3. Chronic shallow superior endplate concavity at L2, L3, L4.  Pain is improved/controlled with Tramadol . Skin repaired as per procedure note. He is moving from the bed to a chair with minimal assistance and is requesting discharge home.  [SU]    Clinical Course User Index [SU] Odell Balls, PA-C                                 Medical Decision Making Amount and/or Complexity of Data Reviewed Radiology:  ordered.  Risk Prescription drug management.        Final diagnoses:  Fall, initial encounter  Compression fracture of L5 vertebra, initial encounter (HCC)  Skin tear of hand without complication, initial encounter  Coagulopathy    ED Discharge Orders          Ordered    traMADol  (ULTRAM ) 50 MG tablet  Every 6 hours PRN        11/09/24 1954               Odell Balls, PA-C 11/14/24 2351    Dreama Longs, MD 11/15/24 1205  "

## 2024-11-09 NOTE — ED Notes (Signed)
 Dermabond, LET and steri-strip left in the room... Provider informed.SABRASABRASABRA

## 2024-11-09 NOTE — Discharge Instructions (Signed)
 As we discussed, you may benefit from using a walker until your back starts to heal. It may help your mobility and also may prevent further falls due to your back injury.   The wound on your hand will need close follow up to insure it is healing without complications such as uncontrolled bleeding or infection. Follow up in 2 days for first recheck and then again after Christmas. This can be done with your doctor, Urgent Care or the emergency department.   You have a compression fracture in your low back that is causing your pain. This should heal without need for surgery or other intervention. Take Tramadol  as directed for pain. You can add Tylenol  to this if needed.

## 2024-11-17 ENCOUNTER — Other Ambulatory Visit: Payer: Self-pay | Admitting: Internal Medicine

## 2024-11-19 MED ORDER — ATORVASTATIN CALCIUM 80 MG PO TABS: 80.0000 mg | ORAL_TABLET | Freq: Every day | ORAL | 3 refills | Status: AC

## 2024-11-26 ENCOUNTER — Telehealth: Payer: Self-pay | Admitting: Internal Medicine

## 2024-11-26 NOTE — Telephone Encounter (Signed)
 Pt c/o swelling/edema: STAT if pt has developed SOB within 24 hours  If swelling, where is the swelling located? Feet ankles   How much weight have you gained and in what time span? 9 pounds in about a week   Have you gained 2 pounds in a day or 5 pounds in a week?   Do you have a log of your daily weights (if so, list)? No   Are you currently taking a fluid pill? Yes   Are you currently SOB? No   Have you traveled recently in a car or plane for an extended period of time? No   Pts daughter says patients primary recommended he see cardiologist asap due to severe swelling. Daughter not with patient at time of call. Please advise

## 2024-11-26 NOTE — Telephone Encounter (Signed)
 Daughter requesting c/b today is possible

## 2024-11-27 NOTE — Telephone Encounter (Signed)
 Spoke with patient on the phone. Denies any symptoms at this time just states he is having swelling in his legs. Does not know his wts or what they have been. Appt made with APP for Monday 1/12.

## 2024-12-01 ENCOUNTER — Ambulatory Visit: Attending: Emergency Medicine | Admitting: Emergency Medicine

## 2024-12-01 ENCOUNTER — Encounter: Payer: Self-pay | Admitting: Emergency Medicine

## 2024-12-01 VITALS — BP 102/60 | HR 56 | Ht 70.0 in | Wt 178.0 lb

## 2024-12-01 DIAGNOSIS — I872 Venous insufficiency (chronic) (peripheral): Secondary | ICD-10-CM

## 2024-12-01 DIAGNOSIS — I4891 Unspecified atrial fibrillation: Secondary | ICD-10-CM | POA: Diagnosis not present

## 2024-12-01 DIAGNOSIS — I5022 Chronic systolic (congestive) heart failure: Secondary | ICD-10-CM

## 2024-12-01 DIAGNOSIS — I350 Nonrheumatic aortic (valve) stenosis: Secondary | ICD-10-CM

## 2024-12-01 DIAGNOSIS — E785 Hyperlipidemia, unspecified: Secondary | ICD-10-CM

## 2024-12-01 DIAGNOSIS — I2510 Atherosclerotic heart disease of native coronary artery without angina pectoris: Secondary | ICD-10-CM | POA: Diagnosis not present

## 2024-12-01 DIAGNOSIS — I10 Essential (primary) hypertension: Secondary | ICD-10-CM | POA: Diagnosis not present

## 2024-12-01 NOTE — Progress Notes (Signed)
 " Cardiology Office Note:    Date:  12/01/2024  ID:  Anthony Carpenter, DOB Mar 31, 1941, MRN [id number] PCP: Nanci Senior, MD  [facility] Providers Cardiologist:  Emeline FORBES Calender, DO       Patient Profile:       Chief Complaint: Acute visit for lower extremity edema History of Present Illness:  Anthony Carpenter is a 84 y.o. 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  He was admitted December 2015 with for chest pain with cath showing 60% in-stent restenosis.  He continued to have angina and had repeat cath on 01/2015 and underwent DES to mid LAD.  Repeat cardiac cath February 2023 showed widely patent stents.  He developed atrial fibrillation on 12/2022.  He was started on Eliquis  5 mg twice daily for stroke prevention.  He underwent cardioversion on 02/2023 which unfortunately was not successful.  Echocardiogram 12/26/2023 showed LVEF of 50%, LV mildly decreased function, global hypokinesis, diastolic parameters are indeterminate, RV function mildly reduced, mildly elevated PASP at 45 mmHg, biatrial size mildly dilated, mild mitral valve regurgitation, trivial AI, mild aortic stenosis with mean gradient 8 mmHg, mild dilation of aortic  root measuring 38 mm, mild dilation of ascending aorta measuring 38 mm.  Was hospitalized from 10/25 through 09/15/2024 at [facility] after being found by police with altered mental status. Per discharge summary, patient's daughter reported the patient and his wife left their home in [place]  and never returned prompting them to track their location on the phone to a gas station in [place] . The patient was disoriented looking at a house with plans for doing night work on the job site.  His CTA head and neck were negative for acute process.  Noted to be persistently bradycardic with heart rates into the low 30s and was in the ICU the evening of 10/26 but did not require any intervention for bradycardia due to his chronic slow heart rate.  Patient and family left AMA before EEG was completed to rule out seizures and prior to cardiac consult.  He was last seen in clinic on 10/15/2024 by Dr. Calender.  He had no complaints and family states he is back to his baseline.  Was seen by PCP on 11/25/2024 for lower extremity edema.  He was started on a short course of Lasix .  He was to follow-up with cardiology   Discussed the use of AI scribe software for clinical note transcription with the patient, who gave verbal consent to proceed.  History of Present Illness Anthony Carpenter is an 84 year old male with heart failure who presents with worsening lower extremity swelling. He is accompanied by his daughter, who is also his primary caregiver. He was referred by his primary care physician for evaluation of his lower extremity swelling.  He has had chronic lower extremity edema with recent worsening, mainly in his feet. Since starting Lasix  on November 25, 2024, his swelling has improved significantly, which his daughter confirms. He takes Lasix  in the morning with good urine output.  He does not currently use compression socks but has recently started elevating his legs which seem to  help.   He has no new or worsening shortness of breath. He only gets short of breath when practically rushing and not at rest or during sleep. He has no chest pain, denies orthopnea, PND, and weight gain.  He and his daughter monitor his sodium intake. He prefers salty snacks such as potato chips but is trying to limit sodium to help control his swelling.  He denies syncope, presyncope, lightheadedness, dizziness, palpitations, melena, hematochezia   Review of systems:  Please see the history of present illness. All other systems are reviewed and otherwise negative.      Studies Reviewed:    EKG Interpretation Date/Time:  Monday December 01 2024 14:46:15 EST Ventricular Rate:  56 PR Interval:    QRS Duration:  80 QT Interval:  440 QTC Calculation: 424 R Axis:   55  Text Interpretation: Atrial fibrillation with slow ventricular response Possible Anterior infarct , age undetermined When compared with ECG of 15-Oct-2024 10:00, Atrial fibrillation has replaced Junctional rhythm Confirmed by Rana Dixon [phone]) on 12/01/2024 4:21:02 PM    Echocardiogram 12/26/2023 1. Left ventricular ejection fraction, by estimation, is 50%. The left  ventricle has mildly decreased function. The left ventricle demonstrates  global hypokinesis. Left ventricular diastolic parameters are  indeterminate.   2. Right ventricular systolic function is mildly reduced. The right  ventricular size is mildly enlarged. There is moderately elevated  pulmonary artery systolic pressure. The estimated right ventricular  systolic pressure is 45.0 mmHg.   3. Left atrial size was mildly dilated.   4. Right atrial size was mildly dilated.   5. The mitral valve is normal in structure. Mild mitral valve  regurgitation. No evidence of mitral stenosis.   6. The aortic valve is tricuspid. There is severe calcifcation of the  aortic valve. Aortic valve regurgitation is trivial. At least mild aortic  valve stenosis  visually though aortic valve mean gradient measures 8.0  mmHg.   7. Aortic dilatation noted. There is mild dilatation of the aortic root,  measuring 38 mm. There is mild dilatation of the ascending aorta,  measuring 38 mm.   8. The inferior vena cava is dilated in size with <50% respiratory  variability, suggesting right atrial pressure of 15 mmHg.   Echocardiogram 09/20/2022 1. Left ventricular ejection fraction, by estimation, is 60 to 65%. The  left ventricle has normal function. The left ventricle has no regional  wall motion abnormalities. Left ventricular diastolic function could not  be evaluated.   2. Right ventricular systolic function is normal. The right ventricular  size is normal. Tricuspid regurgitation signal is inadequate for assessing  PA pressure.   3. The mitral valve is grossly normal. Mild mitral valve regurgitation.  No evidence of mitral stenosis.   4. The aortic valve is tricuspid. There is moderate calcification of the  aortic valve. There is moderate thickening of the aortic valve. Aortic  valve regurgitation is mild. Mild aortic valve stenosis. Aortic valve  area, by VTI measures 1.41 cm. Aortic  valve mean gradient measures 6.0 mmHg. Aortic valve Vmax measures 1.74  m/s.   5. The inferior vena cava is normal in size with  greater than 50%  respiratory variability, suggesting right atrial pressure of 3 mmHg.   Cardiac catheterization 01/10/2022   Previously placed Prox LAD to Mid LAD stent (overlapping drug-eluting stents) are widely patent.   Prox Cx to Mid Cx lesion is 50% stenosed.   1st Diag (jailed) lesion is 30% stenosed.   The left ventricular systolic function is normal.  The left ventricular ejection fraction is 50-55% by visual estimate.   LV end diastolic pressure is normal.   There is no aortic valve stenosis.  Mild calcification.   SUMMARY Widely patent overlapping stents in the proximal to mid LAD.  Minimal RCA disease with most prominent  lesion being roughly 50% focal mid LCx lesion.  No flow-limiting lesion noted. Consider nonmacrovascular disease related chest pain. Preserved LVEF with normal EDP.     RECOMMENDATIONS Consider noncardiac source for chest pain. Continue aggressive risk factor modification. Diagnostic Dominance: Right  Risk Assessment/Calculations:    CHA2DS2-VASc Score = 5   This indicates a 7.2% annual risk of stroke. The patient's score is based upon: CHF History: 1 HTN History: 1 Diabetes History: 0 Stroke History: 0 Vascular Disease History: 1 Age Score: 2 Gender Score: 0             Physical Exam:   VS:  BP 102/60 (BP Location: Right Arm, Patient Position: Sitting, Cuff Size: Normal)   Pulse (!) 56   Ht 5' 10 (1.778 m)   Wt 178 lb (80.7 kg)   BMI 25.54 kg/m    Wt Readings from Last 3 Encounters:  12/01/24 178 lb (80.7 kg)  10/15/24 162 lb (73.5 kg)  04/01/24 169 lb 12.8 oz (77 kg)    GEN: Well nourished, well developed in no acute distress NECK: No JVD; No carotid bruits CARDIAC: Irregularly irregular.  2/6 systolic murmur RESPIRATORY:  Clear to auscultation without rales, wheezing or rhonchi  ABDOMEN: Soft, non-tender, non-distended EXTREMITIES:  No edema; No acute deformity      Assessment and Plan:  Coronary artery disease S/p DES to mid LAD in 2016 Most recent cardiac catheterization 12/2021 widely patent overlapping stents in the proximal to mid LAD and minimal RCA disease.  Medical management recommended - Today patient is stable without chest pain.  He denies any exertional symptoms.  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 of 50% with global hypokinesis - Today he presents with lower extremity edema over the past 2 weeks which has significantly improved with addition of furosemide  by PCP on 1/6 along  with leg elevation - He appears euvolemic and well compensated on exam - Today he denies dyspnea, orthopnea, PND, weight gain - On exam he has 2+ pedal edema which he reports is back at his baseline - Continue furosemide  20 mg daily - GDMT optimization is limited due to low heart rate and blood pressure - Pending repeat echocardiogram on 12/2023  Atrial fibrillation Today he is rate controlled at 56 bpm - He remains asymptomatic and remains off of antiarrhythmic for rate controlling medications - He is without any bleeding concerns - Continue Eliquis  5 mg twice daily  Aortic stenosis Echocardiogram 12/2023 showed mild aortic valve stenosis with mean gradient 8 mmHg - Today he is without chest pains, dyspnea, or syncope.  He denies exertional symptoms - No interventions are warranted at this time - He is pending  echocardiogram on 12/2023 for routine surveillance  Hyperlipidemia LDL 54 on 11/2023 and well-controlled - Continue atorvastatin  80 mg daily  Hypertension Blood pressure today is controlled at 102/60 - No medication changes today  Lower extremity edema Lower extremity venous ultrasound completed 09/2023 referral to [facility], he was diagnosed with chronic venous insufficiency.  He was encouraged to wear compression stockings and elevate legs - Recently had exacerbation of his lower extremity edema approximately 1 to 2 weeks ago and was started on furosemide  by PCP on 1/6 with vast improvement in symptoms - Encouraged lower extremity stockings and leg elevation - Continue furosemide  20 mg daily      Dispo:  Return in about 3 months (around 03/01/2025).  Signed, Lum LITTIE Louis, NP  "

## 2024-12-01 NOTE — Patient Instructions (Addendum)
 Medication Instructions:  NO CHANGES  Lab Work: BMET TO BE DONE TODAY.  Testing/Procedures: NONE  Follow-Up: At [facility], you and your health needs are our priority.  As part of our continuing mission to provide you with exceptional heart care, our providers are all part of one team.  This team includes your primary Cardiologist (physician) and Advanced Practice Providers or APPs (Physician Assistants and Nurse Practitioners) who all work together to provide you with the care you need, when you need it.  Your next appointment:   3 MONTHS  Provider:   Emeline FORBES Calender, DO    We recommend signing up for the patient portal called MyChart.  Sign up information is provided on this After Visit Summary.  MyChart is used to connect with patients for Virtual Visits (Telemedicine).  Patients are able to view lab/test results, encounter notes, upcoming appointments, etc.  Non-urgent messages can be sent to your provider as well.   To learn more about what you can do with MyChart, go to [email or web address].   Other Instructions:

## 2024-12-02 LAB — BASIC METABOLIC PANEL WITH GFR
BUN/Creatinine Ratio: 17 (ref 10–24)
BUN: 20 mg/dL (ref 8–27)
CO2: 22 mmol/L (ref 20–29)
Calcium: 9.2 mg/dL (ref 8.6–10.2)
Chloride: 106 mmol/L (ref 96–106)
Creatinine, Ser: 1.19 mg/dL (ref 0.76–1.27)
Glucose: 104 mg/dL — ABNORMAL HIGH (ref 70–99)
Potassium: 4.3 mmol/L (ref 3.5–5.2)
Sodium: 144 mmol/L (ref 134–144)
eGFR: 60 mL/min/1.73

## 2024-12-03 ENCOUNTER — Ambulatory Visit: Payer: Self-pay | Admitting: Emergency Medicine

## 2024-12-04 ENCOUNTER — Emergency Department (HOSPITAL_BASED_OUTPATIENT_CLINIC_OR_DEPARTMENT_OTHER)

## 2024-12-04 ENCOUNTER — Emergency Department (HOSPITAL_BASED_OUTPATIENT_CLINIC_OR_DEPARTMENT_OTHER)
Admission: EM | Admit: 2024-12-04 | Discharge: 2024-12-04 | Disposition: A | Discharge status: Home / Self Care | Attending: Emergency Medicine | Admitting: Emergency Medicine

## 2024-12-04 ENCOUNTER — Encounter (HOSPITAL_BASED_OUTPATIENT_CLINIC_OR_DEPARTMENT_OTHER): Payer: Self-pay

## 2024-12-04 ENCOUNTER — Other Ambulatory Visit: Payer: Self-pay

## 2024-12-04 DIAGNOSIS — S0181XA Laceration without foreign body of other part of head, initial encounter: Secondary | ICD-10-CM | POA: Insufficient documentation

## 2024-12-04 DIAGNOSIS — W01198A Fall on same level from slipping, tripping and stumbling with subsequent striking against other object, initial encounter: Secondary | ICD-10-CM | POA: Diagnosis not present

## 2024-12-04 DIAGNOSIS — I2510 Atherosclerotic heart disease of native coronary artery without angina pectoris: Secondary | ICD-10-CM | POA: Insufficient documentation

## 2024-12-04 DIAGNOSIS — S41111A Laceration without foreign body of right upper arm, initial encounter: Secondary | ICD-10-CM | POA: Insufficient documentation

## 2024-12-04 DIAGNOSIS — S0993XA Unspecified injury of face, initial encounter: Secondary | ICD-10-CM | POA: Diagnosis present

## 2024-12-04 DIAGNOSIS — Z7901 Long term (current) use of anticoagulants: Secondary | ICD-10-CM | POA: Diagnosis not present

## 2024-12-04 DIAGNOSIS — S0083XA Contusion of other part of head, initial encounter: Secondary | ICD-10-CM

## 2024-12-04 DIAGNOSIS — I509 Heart failure, unspecified: Secondary | ICD-10-CM | POA: Insufficient documentation

## 2024-12-04 DIAGNOSIS — I110 Hypertensive heart disease with heart failure: Secondary | ICD-10-CM | POA: Insufficient documentation

## 2024-12-04 DIAGNOSIS — W19XXXA Unspecified fall, initial encounter: Secondary | ICD-10-CM

## 2024-12-04 MED ORDER — LIDOCAINE HCL URETHRAL/MUCOSAL 2 % EX GEL
1.0000 | Freq: Once | CUTANEOUS | Status: AC
  Administered 2024-12-04: 1 via TOPICAL
  Filled 2024-12-04: qty 11

## 2024-12-04 MED ORDER — LIDOCAINE-EPINEPHRINE (PF) 2 %-1:200000 IJ SOLN
10.0000 mL | Freq: Once | INTRAMUSCULAR | Status: AC
  Administered 2024-12-04: 10 mL
  Filled 2024-12-04: qty 20

## 2024-12-04 MED ORDER — BACITRACIN ZINC 500 UNIT/GM EX OINT
TOPICAL_OINTMENT | Freq: Once | CUTANEOUS | Status: AC
  Administered 2024-12-04: 31.1111 via TOPICAL
  Filled 2024-12-04: qty 28.35

## 2024-12-04 NOTE — ED Notes (Signed)
 Pt requested nurse to call daughters and advise he was in ED. Left VM on both daughters cell phone

## 2024-12-04 NOTE — ED Triage Notes (Signed)
 Pt reports falling this morning on the brick sidewalk. Reports that he takes blood thinners large skin to right arm and large knot/laceration to right eye/forehead.. Bleeding is not controlled. Pt holding towel to face to control bleeding.

## 2024-12-04 NOTE — ED Notes (Signed)
 Patient transported to CT

## 2024-12-04 NOTE — ED Provider Notes (Signed)
 " [facility] Provider Note   CSN: [id number] Arrival date & time: 12/04/24  9062     Patient presents with: Anthony Carpenter is a 84 y.o. male.   Pt is an 84 yo male with pmhx significant for hld, CAD, HTN, glaucoma, CHF, and afib (on Eliquis ).  Pt fell on his brick sidewalk.  He did hit his head.  No loc.  He sustained a lac to his right eyebrow.  He also sustained a large skin tear to his right arm.  He denies any bony pain.  He was able to ambulate after falling.         Prior to Admission medications  Medication Sig Start Date End Date Taking? Authorizing Provider  ELIQUIS  5 MG TABS tablet TAKE 1 TABLET BY MOUTH TWICE A DAY 02/18/24  Yes Nahser, Aleene PARAS, MD  atorvastatin  (LIPITOR ) 80 MG tablet Take 1 tablet (80 mg total) by mouth daily. 11/19/24   Segal, Jared E, DO  dorzolamide -timolol  (COSOPT ) 22.3-6.8 MG/ML ophthalmic solution Place 1 drop into both eyes 2 (two) times daily.     [provider]  furosemide  (LASIX ) 20 MG tablet Take 20 mg by mouth daily. 11/25/24   [provider]  latanoprost  (XALATAN ) 0.005 % ophthalmic solution Place 1 drop into both eyes at bedtime.    [provider]  Multiple Vitamins-Minerals (ICAPS AREDS FORMULA PO) Take 1 tablet by mouth 2 (two) times daily.     [provider]  nitroGLYCERIN  (NITROSTAT ) 0.4 MG SL tablet Place 1 tablet (0.4 mg total) under the tongue every 5 (five) minutes as needed. For chest pain. 03/06/22   Swinyer, Rosaline HERO, NP  traMADol  (ULTRAM ) 50 MG tablet Take 1 tablet (50 mg total) by mouth every 6 (six) hours as needed. 11/09/24   Odell Balls, PA-C    Allergies: Patient has no known allergies.    Review of Systems  Skin:  Positive for wound.  All other systems reviewed and are negative.   Updated Vital Signs BP (!) 158/81   Pulse 77   Temp (!) 97.4 F (36.3 C)   Resp 16   Ht 5' 10 (1.778 m)   Wt 68 kg   SpO2 96%   BMI 21.52  kg/m   Physical Exam Vitals and nursing note reviewed.  HENT:     Head: Normocephalic.     Comments: Large lac/hematoma to right eyebrow; several small arterioles bleeding    Right Ear: External ear normal.     Left Ear: External ear normal.     Nose: Nose normal.     Mouth/Throat:     Mouth: Mucous membranes are moist.     Pharynx: Oropharynx is clear.  Eyes:     Extraocular Movements: Extraocular movements intact.     Conjunctiva/sclera: Conjunctivae normal.     Pupils: Pupils are equal, round, and reactive to light.  Cardiovascular:     Rate and Rhythm: Normal rate. Rhythm irregular.     Pulses: Normal pulses.     Heart sounds: Normal heart sounds.  Pulmonary:     Effort: Pulmonary effort is normal.     Breath sounds: Normal breath sounds.  Musculoskeletal:        General: Normal range of motion.     Cervical back: Normal range of motion and neck supple.  Skin:    Capillary Refill: Capillary refill takes less than 2 seconds.     Comments: Large  skin tear right upper arm/elbow  Neurological:     General: No focal deficit present.     Mental Status: He is alert and oriented to person, place, and time.     (all labs ordered are listed, but only abnormal results are displayed) Labs Reviewed - No data to display  EKG: None  Radiology: DG Pelvis 1-2 Views Result Date: 12/04/2024 CLINICAL DATA:  Low back pain after fall EXAM: PELVIS - 1-2 VIEW COMPARISON:  None Available. FINDINGS: There is no evidence of pelvic fracture or diastasis. No pelvic bone lesions are seen. IMPRESSION: Negative. Electronically Signed   By: Lynwood Landy Raddle M.D.   On: 12/04/2024 12:01   DG Elbow Complete Right Result Date: 12/04/2024 CLINICAL DATA:  Right elbow pain after fall EXAM: RIGHT ELBOW - COMPLETE 3+ VIEW COMPARISON:  None Available. FINDINGS: There is no evidence of fracture, dislocation, or joint effusion. There is no evidence of arthropathy or other focal bone abnormality. Soft tissues  are unremarkable. IMPRESSION: Negative. Electronically Signed   By: Lynwood Landy Raddle M.D.   On: 12/04/2024 12:00   DG Chest 1 View Result Date: 12/04/2024 CLINICAL DATA:  Fall today EXAM: DG CHEST 1V COMPARISON:  Apr 10, 2023 FINDINGS: Stable cardiomediastinal silhouette. Minimal right pleural effusion is noted with minimal adjacent subsegmental atelectasis. Left lung is clear. Bony thorax is unremarkable. IMPRESSION: Minimal right pleural effusion is noted with minimal adjacent subsegmental atelectasis. Electronically Signed   By: Lynwood Landy Raddle M.D.   On: 12/04/2024 11:57   CT Cervical Spine Wo Contrast Result Date: 12/04/2024 EXAM: CT CERVICAL SPINE WITHOUT CONTRAST 12/04/2024 11:00:00 AM TECHNIQUE: CT of the cervical spine was performed without the administration of intravenous contrast. Multiplanar reformatted images are provided for review. Automated exposure control, iterative reconstruction, and/or weight based adjustment of the mA/kV was utilized to reduce the radiation dose to as low as reasonably achievable. COMPARISON: 11/09/2024 CLINICAL HISTORY: Neck trauma (Age >= 65y) FINDINGS: BONES AND ALIGNMENT: No acute fracture or traumatic malalignment. DEGENERATIVE CHANGES: Mild disc space narrowing at multiple levels. There are small disc osteophyte complexes throughout the cervical spine. There is mild osseous spinal canal stenosis at C3-C4, C4-C5, and C5-C6. Facet arthrosis and uncovertebral hypertrophy at multiple levels. Foraminal stenosis is most pronounced on the left at C4-C5. SOFT TISSUES: No prevertebral soft tissue swelling. LUNGS: Biapical pleural scarring. IMPRESSION: 1. No evidence of acute traumatic injury. 2. Degenerative changes as above. Electronically signed by: Donnice Mania MD 12/04/2024 11:15 AM EST RP Workstation: [id number]   CT Head Wo Contrast Result Date: 12/04/2024 EXAM: CT HEAD WITHOUT CONTRAST 12/04/2024 11:00:00 AM TECHNIQUE: CT of the head was performed without the  administration of intravenous contrast. Automated exposure control, iterative reconstruction, and/or weight based adjustment of the mA/kV was utilized to reduce the radiation dose to as low as reasonably achievable. COMPARISON: 11/09/2024 CLINICAL HISTORY: Polytrauma, blunt. FINDINGS: BRAIN AND VENTRICLES: No acute hemorrhage. No evidence of acute infarct. No hydrocephalus. No extra-axial collection. No mass effect or midline shift. Mild generalized cerebral volume loss and mild periventricular and deep cerebral white matter disease. Mild calcific atheromatous disease. ORBITS: Status post bilateral lens replacement. SINUSES: No acute abnormality. Similar appearance of nodular soft tissue in the nasal cavity. SOFT TISSUES AND SKULL: Right supraorbital soft tissue swelling with subcutaneous emphysema. No skull fracture. IMPRESSION: 1. Right supraorbital soft tissue swelling with subcutaneous emphysema. 2. No acute intracranial abnormality. 3. Similar nodular soft tissue in the nasal cavity which could reflect polyps. Recommend correlation with  direct visualization. Electronically signed by: Donnice Mania MD 12/04/2024 11:09 AM EST RP Workstation: [id number]     .Laceration Repair  Date/Time: 12/04/2024 10:42 AM  Performed by: Dean Clarity, MD Authorized by: Dean Clarity, MD   Consent:    Consent obtained:  Verbal   Consent given by:  Patient Universal protocol:    Patient identity confirmed:  Verbally with patient Anesthesia:    Anesthesia method:  Local infiltration   Local anesthetic:  Lidocaine  2% WITH epi Laceration details:    Location:  Face   Face location:  Forehead   Length (cm):  2 Pre-procedure details:    Preparation:  Patient was prepped and draped in usual sterile fashion Exploration:    Hemostasis achieved with:  Tied off vessels and epinephrine  Treatment:    Area cleansed with:  Saline   Amount of cleaning:  Standard   Layers/structures repaired:  Muscle fascia Muscle  fascia:    Suture size:  4-0   Suture technique:  Simple interrupted   Number of sutures:  5 Skin repair:    Repair method:  Sutures   Suture size:  5-0   Suture material:  Nylon   Suture technique:  Simple interrupted   Number of sutures:  7 Repair type:    Repair type:  Intermediate Post-procedure details:    Dressing:  Adhesive bandage and antibiotic ointment   Procedure completion:  Tolerated well, no immediate complications    Medications Ordered in the ED  lidocaine  (XYLOCAINE ) 2 % jelly 1 Application (1 Application Topical Given 12/04/24 1007)  lidocaine -EPINEPHrine  (XYLOCAINE  W/EPI) 2 %-1:200000 (PF) injection 10 mL (10 mLs Infiltration Given by Other 12/04/24 1007)  bacitracin  ointment (31.1111 Applications Topical Given 12/04/24 1140)                                    Medical Decision Making Amount and/or Complexity of Data Reviewed Radiology: ordered.  Risk OTC drugs. Prescription drug management.   This patient presents to the ED for concern of fall, this involves an extensive number of treatment options, and is a complaint that carries with it a high risk of complications and morbidity.  The differential diagnosis includes multiple trauma   Co morbidities that complicate the patient evaluation  hld, CAD, HTN, glaucoma, CHF, and afib (on Eliquis )   Additional history obtained:  Additional history obtained from epic chart review External records from outside source obtained and reviewed including wife, daughter  Imaging Studies ordered:  I ordered imaging studies including ct head/c-spine; cxr, pelvis, and right elbow  I independently visualized and interpreted imaging which showed  CT head:  Right supraorbital soft tissue swelling with subcutaneous emphysema.  2. No acute intracranial abnormality.  3. Similar nodular soft tissue in the nasal cavity which could reflect polyps.  Recommend correlation with direct visualization.  CT cervical spine: No  evidence of acute traumatic injury.  2. Degenerative changes as above.  Pelvis: Negative.  R elbow: Negative.  CXR: Minimal right pleural effusion is noted with minimal adjacent  subsegmental atelectasis.   I agree with the radiologist interpretation   Medicines ordered and prescription drug management:   I have reviewed the patients home medicines and have made adjustments as needed   Test Considered:  ct   Problem List / ED Course:  Fall with lac and skin tear:  lac repaired, nursing cleaned and dressed skin tear.  Pt is able to  ambulate.  Daughter will help with wound care until they can see the wound clinic.  Pt has an appt with pcp in am.  He is to keep that appt.   Reevaluation:  After the interventions noted above, I reevaluated the patient and found that they have :improved   Social Determinants of Health:  Lives at home   Dispostion:  After consideration of the diagnostic results and the patients response to treatment, I feel that the patent would benefit from discharge with outpatient f/u.       Final diagnoses:  Fall, initial encounter  Anticoagulated on apixaban   Laceration of forehead, initial encounter  Traumatic hematoma of forehead, initial encounter  Skin tear of right upper arm without complication, initial encounter    ED Discharge Orders          Ordered    Ambulatory referral to Wound Clinic        12/04/24 1218               Dean Clarity, MD 12/04/24 1315  "

## 2024-12-04 NOTE — ED Notes (Signed)
 Walked Pt.  Reported to EDP that Pt. Did well.  Pt. Dressing changed and secured.  Showed the daughter how the dressing change was done and gave xtra changes for her to do.

## 2024-12-04 NOTE — Discharge Instructions (Signed)
 Clean wounds gently with soap and water.  Change dressings twice a day.  Do NOT take Eliquis  today and tomorrow.

## 2024-12-14 ENCOUNTER — Encounter (HOSPITAL_BASED_OUTPATIENT_CLINIC_OR_DEPARTMENT_OTHER): Payer: Self-pay | Admitting: *Deleted

## 2024-12-14 ENCOUNTER — Emergency Department (HOSPITAL_BASED_OUTPATIENT_CLINIC_OR_DEPARTMENT_OTHER)

## 2024-12-14 ENCOUNTER — Other Ambulatory Visit: Payer: Self-pay

## 2024-12-14 ENCOUNTER — Emergency Department (HOSPITAL_BASED_OUTPATIENT_CLINIC_OR_DEPARTMENT_OTHER)
Admission: EM | Admit: 2024-12-14 | Discharge: 2024-12-14 | Disposition: A | Discharge status: Home / Self Care | Attending: Emergency Medicine | Admitting: Emergency Medicine

## 2024-12-14 DIAGNOSIS — W19XXXA Unspecified fall, initial encounter: Secondary | ICD-10-CM

## 2024-12-14 DIAGNOSIS — E785 Hyperlipidemia, unspecified: Secondary | ICD-10-CM | POA: Diagnosis not present

## 2024-12-14 DIAGNOSIS — S51012A Laceration without foreign body of left elbow, initial encounter: Secondary | ICD-10-CM | POA: Insufficient documentation

## 2024-12-14 DIAGNOSIS — H409 Unspecified glaucoma: Secondary | ICD-10-CM | POA: Diagnosis not present

## 2024-12-14 DIAGNOSIS — S59902A Unspecified injury of left elbow, initial encounter: Secondary | ICD-10-CM | POA: Diagnosis present

## 2024-12-14 DIAGNOSIS — I110 Hypertensive heart disease with heart failure: Secondary | ICD-10-CM | POA: Insufficient documentation

## 2024-12-14 DIAGNOSIS — R296 Repeated falls: Secondary | ICD-10-CM | POA: Insufficient documentation

## 2024-12-14 DIAGNOSIS — S0011XA Contusion of right eyelid and periocular area, initial encounter: Secondary | ICD-10-CM | POA: Diagnosis not present

## 2024-12-14 DIAGNOSIS — Z7901 Long term (current) use of anticoagulants: Secondary | ICD-10-CM | POA: Insufficient documentation

## 2024-12-14 DIAGNOSIS — I4891 Unspecified atrial fibrillation: Secondary | ICD-10-CM | POA: Insufficient documentation

## 2024-12-14 DIAGNOSIS — I509 Heart failure, unspecified: Secondary | ICD-10-CM | POA: Insufficient documentation

## 2024-12-14 DIAGNOSIS — W1839XA Other fall on same level, initial encounter: Secondary | ICD-10-CM | POA: Insufficient documentation

## 2024-12-14 DIAGNOSIS — S0990XA Unspecified injury of head, initial encounter: Secondary | ICD-10-CM | POA: Diagnosis present

## 2024-12-14 DIAGNOSIS — I2510 Atherosclerotic heart disease of native coronary artery without angina pectoris: Secondary | ICD-10-CM | POA: Diagnosis not present

## 2024-12-14 DIAGNOSIS — I482 Chronic atrial fibrillation, unspecified: Secondary | ICD-10-CM | POA: Insufficient documentation

## 2024-12-14 MED ORDER — DOXYCYCLINE HYCLATE 100 MG PO CAPS: 100.0000 mg | ORAL_CAPSULE | Freq: Two times a day (BID) | ORAL | 0 refills | Status: AC

## 2024-12-14 MED ORDER — LIDOCAINE-EPINEPHRINE (PF) 2 %-1:200000 IJ SOLN
10.0000 mL | Freq: Once | INTRAMUSCULAR | Status: AC
  Administered 2024-12-14: 10 mL via INTRADERMAL
  Filled 2024-12-14: qty 20

## 2024-12-14 MED ORDER — LIDOCAINE-EPINEPHRINE 2 %-1:100000 IJ SOLN: 20.0000 mL | Freq: Once | INTRAMUSCULAR | Status: DC

## 2024-12-14 NOTE — ED Provider Notes (Signed)
 " [facility] AT [facility] Provider Note   CSN: [id number] Arrival date & time: 12/14/24  9296     Patient presents with: Wound Check   Anthony Carpenter is a 84 y.o. male.   HPI     84 year old male with a history of hyperlipidemia, coronary artery disease, hypertension, glaucoma, CHF, atrial fibrillation on Eliquis , recent fall January 15 with right arm skin tear and facial laceration, who presents with concern for fall and skin tear.   Reports that he has been having falls more frequently including a fall 10 days ago that brought him to the emergency department.  He had a fall at 3 PM yesterday, reports that his legs sort of got tangled up around each other and he fell down onto his left elbow.  It happened so fast that he is not sure if he hit his head.  He denies having any lightheadedness, syncope before or after the fall.  He denies chest pain, shortness of breath, numbness, weakness, change in vision, new difficulty talking or walking.  They bandaged his left elbow skin tear yesterday, but when he got up in the middle of the night to go to the bathroom, it started bleeding again as he was moving his arm.  He denies any pain to any location including his elbow.    Prior to Admission medications  Medication Sig Start Date End Date Taking? Authorizing Provider  doxycycline  (VIBRAMYCIN ) 100 MG capsule Take 1 capsule (100 mg total) by mouth 2 (two) times daily for 3 days. 12/17/24 12/20/24 Yes Dreama Longs, MD  atorvastatin  (LIPITOR ) 80 MG tablet Take 1 tablet (80 mg total) by mouth daily. 11/19/24   Segal, Jared E, DO  dorzolamide -timolol  (COSOPT ) 22.3-6.8 MG/ML ophthalmic solution Place 1 drop into both eyes 2 (two) times daily.     [provider]  ELIQUIS  5 MG TABS tablet TAKE 1 TABLET BY MOUTH TWICE A DAY 02/18/24   Nahser, Aleene PARAS, MD  furosemide  (LASIX ) 20 MG tablet Take 20 mg by mouth daily. 11/25/24   [provider]  latanoprost   (XALATAN ) 0.005 % ophthalmic solution Place 1 drop into both eyes at bedtime.    [provider]  Multiple Vitamins-Minerals (ICAPS AREDS FORMULA PO) Take 1 tablet by mouth 2 (two) times daily.     [provider]  nitroGLYCERIN  (NITROSTAT ) 0.4 MG SL tablet Place 1 tablet (0.4 mg total) under the tongue every 5 (five) minutes as needed. For chest pain. 03/06/22   Swinyer, Rosaline HERO, NP  traMADol  (ULTRAM ) 50 MG tablet Take 1 tablet (50 mg total) by mouth every 6 (six) hours as needed. 11/09/24   Odell Balls, PA-C    Allergies: Patient has no known allergies.    Review of Systems  Updated Vital Signs BP (!) 140/82 (BP Location: Right Arm)   Pulse 80   Temp 98.4 F (36.9 C) (Oral)   Resp 18   Ht 5' 10 (1.778 m)   Wt 68 kg   SpO2 99%   BMI 21.52 kg/m   Physical Exam Vitals and nursing note reviewed.  Constitutional:      General: He is not in acute distress.    Appearance: He is well-developed. He is not diaphoretic.  HENT:     Head: Normocephalic and atraumatic.  Eyes:     Conjunctiva/sclera: Conjunctivae normal.  Cardiovascular:     Rate and Rhythm: Normal rate and regular rhythm.     Pulses: Normal pulses.  Pulmonary:  Effort: Pulmonary effort is normal. No respiratory distress.  Abdominal:     General: There is no distension.     Palpations: Abdomen is soft.     Tenderness: There is no abdominal tenderness. There is no guarding.  Musculoskeletal:     Cervical back: Normal range of motion.     Comments: No tenderness to elbow Normal rom arms, legs  Skin:    General: Skin is warm and dry.     Comments: Skin tear left elbow 4cm .5cm other small laceration with bleeding  Neurological:     Mental Status: He is alert and oriented to person, place, and time.     Cranial Nerves: No cranial nerve deficit.     Sensory: No sensory deficit.     Motor: No weakness.     Coordination: Coordination normal.     (all labs ordered are listed, but only  abnormal results are displayed) Labs Reviewed - No data to display  EKG: None  Radiology: CT Head Wo Contrast Result Date: 12/14/2024 EXAM: CT HEAD WITHOUT CONTRAST 12/14/2024 08:01:04 AM TECHNIQUE: CT of the head was performed without the administration of intravenous contrast. Automated exposure control, iterative reconstruction, and/or weight based adjustment of the mA/kV was utilized to reduce the radiation dose to as low as reasonably achievable. COMPARISON: CT head 12/04/2024. CLINICAL HISTORY: 84 year old male. Head trauma, minor (Age >= 65 years). Fall yesterday with left upper extremity bleeding, and on blood thinners. FINDINGS: BRAIN AND VENTRICLES: No acute hemorrhage. No evidence of acute infarct. No hydrocephalus. No extra-axial collection. No mass effect or midline shift. Parenchymal volume is stable and within limits for age. Gray-white differentiation is stable with mild for age heterogeneity in the deep gray nuclei. No suspicious intracranial vascular hyperdensity. Calcified atherosclerosis at the skull base. ORBITS: No acute abnormality. SINUSES: Paranasal sinuses, tympanic cavities, and mastoids are well aerated. Secretions are retained in the visible nasopharynx. SOFT TISSUES AND SKULL: Right forehead and periorbital scalp hematoma has regressed but not fully resolved. No new superficial soft tissue injury identified. Advanced C1-C2 degeneration is partially visible. No skull fracture. IMPRESSION: 1. Right forehead and periorbital soft tissue injury, regressed. 2. No new traumatic injury or acute intracranial abnormality. Electronically signed by: Helayne Hurst MD 12/14/2024 08:15 AM EST RP Workstation: [id number]     .Laceration Repair  Date/Time: 12/14/2024 8:51 PM  Performed by: Dreama Longs, MD Authorized by: Dreama Longs, MD   Consent:    Consent obtained:  Verbal   Consent given by:  Patient   Risks, benefits, and alternatives were discussed: yes     Risks  discussed:  Infection, pain and poor wound healing   Alternatives discussed:  No treatment Universal protocol:    Patient identity confirmed:  Verbally with patient Anesthesia:    Anesthesia method:  Local infiltration   Local anesthetic:  Lidocaine  2% WITH epi Laceration details:    Location:  Shoulder/arm   Shoulder/arm location:  L elbow   Length (cm):  4 Pre-procedure details:    Preparation:  Patient was prepped and draped in usual sterile fashion Exploration:    Hemostasis achieved with:  Epinephrine  and direct pressure Treatment:    Area cleansed with:  Saline   Irrigation solution:  Sterile saline   Irrigation volume:  500   Visualized foreign bodies/material removed: no     Debridement:  None   Undermining:  None Skin repair:    Repair method:  Sutures   Suture size:  4-0   Suture material:  Prolene   Suture technique:  Simple interrupted   Number of sutures:  8 Approximation:    Approximation:  Loose Repair type:    Repair type:  Simple Post-procedure details:    Dressing:  Sterile dressing   Procedure completion:  Tolerated well, no immediate complications    Medications Ordered in the ED  lidocaine -EPINEPHrine  (XYLOCAINE  W/EPI) 2 %-1:200000 (PF) injection 10 mL (10 mLs Intradermal Given 12/14/24 0852)                                      84 year old male with a history of hyperlipidemia, coronary artery disease, hypertension, glaucoma, CHF, atrial fibrillation on Eliquis , recent fall January 15 with right arm skin tear and facial laceration, who presents with concern for fall and skin tear.   Given fall on Eliquis  and him not being sure whether or not he hit his head, will obtain CT head for further evaluation.  He does not have focal neurologic symptoms or findings, nor symptoms of acute illness, no lightheadedness or weakness, and have low suspicion for other medical emergency at this time.  CT head completed shows no acute abnormality.  Discussed  risks of laceration repair given duration of time since injury yesterday, however given active bleeding from the area at this time feel repair indicated for hemostasis and will continue his rx doxycycline  (for right arm skin tear cellulitis) for 3 additional days.  Skin tear sutured, hemostasis achieved.  Patient discharged in stable condition with understanding of reasons to return.        Final diagnoses:  Fall, initial encounter  Skin tear of left elbow without complication, initial encounter    ED Discharge Orders          Ordered    doxycycline  (VIBRAMYCIN ) 100 MG capsule  2 times daily        12/14/24 9148               Dreama Longs, MD 12/14/24 7941  "

## 2024-12-14 NOTE — ED Triage Notes (Signed)
 Pt arrives via [facility] from home. Per their report, they were called out for a fall that occurred yesterday. He has a skin tear on the left arm that is bleeding. Pressure dressing applied to the left elbow for transport. Pt is on blood thinners. En route vitals 128/72, 80, 18, 98% RA.

## 2024-12-14 NOTE — ED Triage Notes (Signed)
 Pt reports that he fell yesterday onto the hardwood floors at home He is not having any pain. Skin tear to the left elbow that is having some bleeding, dressing reapplied. He has bruising to his right face and a wound to the right upper arm that occurred during a fall last week.

## 2024-12-30 ENCOUNTER — Ambulatory Visit (HOSPITAL_COMMUNITY)

## 2025-03-03 ENCOUNTER — Ambulatory Visit: Admitting: Internal Medicine
